# Patient Record
Sex: Female | Born: 1975 | Race: White | Hispanic: No | Marital: Single | Smoking: Former smoker
Health system: Southern US, Community
[De-identification: ages and names within clinical notes are randomized; demographics above are authoritative.]

## PROBLEM LIST (undated history)

## (undated) DIAGNOSIS — IMO0001 Reserved for inherently not codable concepts without codable children: Secondary | ICD-10-CM

## (undated) DIAGNOSIS — C50919 Malignant neoplasm of unspecified site of unspecified female breast: Secondary | ICD-10-CM

## (undated) DIAGNOSIS — R4589 Other symptoms and signs involving emotional state: Secondary | ICD-10-CM

## (undated) DIAGNOSIS — IMO0002 Reserved for concepts with insufficient information to code with codable children: Secondary | ICD-10-CM

## (undated) DIAGNOSIS — F909 Attention-deficit hyperactivity disorder, unspecified type: Secondary | ICD-10-CM

## (undated) DIAGNOSIS — F419 Anxiety disorder, unspecified: Secondary | ICD-10-CM

## (undated) DIAGNOSIS — F319 Bipolar disorder, unspecified: Secondary | ICD-10-CM

## (undated) DIAGNOSIS — F329 Major depressive disorder, single episode, unspecified: Secondary | ICD-10-CM

## (undated) HISTORY — DX: Other symptoms and signs involving emotional state: R45.89

## (undated) HISTORY — DX: Reserved for concepts with insufficient information to code with codable children: IMO0002

## (undated) HISTORY — DX: Malignant neoplasm of unspecified site of unspecified female breast: C50.919

## (undated) HISTORY — DX: Anxiety disorder, unspecified: F41.9

## (undated) HISTORY — PX: WISDOM TOOTH EXTRACTION: SHX21

## (undated) HISTORY — DX: Major depressive disorder, single episode, unspecified: F32.9

## (undated) HISTORY — DX: Bipolar disorder, unspecified: F31.9

## (undated) HISTORY — DX: Reserved for inherently not codable concepts without codable children: IMO0001

---

## 1998-06-29 ENCOUNTER — Emergency Department (HOSPITAL_COMMUNITY): Admission: EM | Admit: 1998-06-29 | Discharge: 1998-06-29 | Payer: Self-pay | Admitting: Internal Medicine

## 1998-10-08 ENCOUNTER — Emergency Department (HOSPITAL_COMMUNITY): Admission: EM | Admit: 1998-10-08 | Discharge: 1998-10-08 | Payer: Self-pay | Admitting: Emergency Medicine

## 1999-10-09 ENCOUNTER — Other Ambulatory Visit: Admission: RE | Admit: 1999-10-09 | Discharge: 1999-10-09 | Payer: Self-pay | Admitting: Obstetrics and Gynecology

## 2000-01-02 ENCOUNTER — Inpatient Hospital Stay (HOSPITAL_COMMUNITY): Admission: AD | Admit: 2000-01-02 | Discharge: 2000-01-02 | Payer: Self-pay | Admitting: Obstetrics and Gynecology

## 2000-01-03 ENCOUNTER — Inpatient Hospital Stay (HOSPITAL_COMMUNITY): Admission: AD | Admit: 2000-01-03 | Discharge: 2000-01-06 | Payer: Self-pay | Admitting: Obstetrics and Gynecology

## 2000-01-03 ENCOUNTER — Inpatient Hospital Stay (HOSPITAL_COMMUNITY): Admission: AD | Admit: 2000-01-03 | Discharge: 2000-01-03 | Payer: Self-pay | Admitting: Obstetrics & Gynecology

## 2015-08-10 LAB — HM MAMMOGRAPHY

## 2015-08-12 ENCOUNTER — Encounter: Payer: Self-pay | Admitting: *Deleted

## 2015-08-12 ENCOUNTER — Telehealth: Payer: Self-pay | Admitting: *Deleted

## 2015-08-12 DIAGNOSIS — C50211 Malignant neoplasm of upper-inner quadrant of right female breast: Secondary | ICD-10-CM | POA: Insufficient documentation

## 2015-08-12 NOTE — Telephone Encounter (Signed)
Confirmed [facility] for 08/17/15 at 12:30pm .  Instructions and contact information given.

## 2015-08-17 ENCOUNTER — Ambulatory Visit (HOSPITAL_BASED_OUTPATIENT_CLINIC_OR_DEPARTMENT_OTHER): Payer: BLUE CROSS/BLUE SHIELD | Admitting: Hematology and Oncology

## 2015-08-17 ENCOUNTER — Encounter: Payer: Self-pay | Admitting: Physical Therapy

## 2015-08-17 ENCOUNTER — Encounter: Payer: Self-pay | Admitting: Nurse Practitioner

## 2015-08-17 ENCOUNTER — Other Ambulatory Visit (HOSPITAL_BASED_OUTPATIENT_CLINIC_OR_DEPARTMENT_OTHER): Payer: BLUE CROSS/BLUE SHIELD

## 2015-08-17 ENCOUNTER — Ambulatory Visit: Payer: BLUE CROSS/BLUE SHIELD | Attending: General Surgery | Admitting: Physical Therapy

## 2015-08-17 ENCOUNTER — Encounter: Payer: Self-pay | Admitting: Hematology and Oncology

## 2015-08-17 ENCOUNTER — Ambulatory Visit
Admission: RE | Admit: 2015-08-17 | Discharge: 2015-08-17 | Disposition: A | Discharge status: Home / Self Care | Payer: BLUE CROSS/BLUE SHIELD | Source: Ambulatory Visit | Attending: Radiation Oncology | Admitting: Radiation Oncology

## 2015-08-17 VITALS — BP 146/84 | HR 108 | Temp 98.3°F | Resp 18 | Ht 61.0 in | Wt 184.2 lb

## 2015-08-17 DIAGNOSIS — R293 Abnormal posture: Secondary | ICD-10-CM | POA: Diagnosis present

## 2015-08-17 DIAGNOSIS — C50211 Malignant neoplasm of upper-inner quadrant of right female breast: Secondary | ICD-10-CM

## 2015-08-17 DIAGNOSIS — C50911 Malignant neoplasm of unspecified site of right female breast: Secondary | ICD-10-CM | POA: Insufficient documentation

## 2015-08-17 LAB — CBC WITH DIFFERENTIAL/PLATELET
BASO%: 0.2 % (ref 0.0–2.0)
Basophils Absolute: 0 10*3/uL (ref 0.0–0.1)
EOS%: 0.7 % (ref 0.0–7.0)
Eosinophils Absolute: 0.1 10*3/uL (ref 0.0–0.5)
HCT: 40.5 % (ref 34.8–46.6)
HGB: 14.1 g/dL (ref 11.6–15.9)
LYMPH%: 19.4 % (ref 14.0–49.7)
MCH: 28.1 pg (ref 25.1–34.0)
MCHC: 34.8 g/dL (ref 31.5–36.0)
MCV: 80.7 fL (ref 79.5–101.0)
MONO#: 0.5 10*3/uL (ref 0.1–0.9)
MONO%: 6.3 % (ref 0.0–14.0)
NEUT%: 73.4 % (ref 38.4–76.8)
NEUTROS ABS: 6.2 10*3/uL (ref 1.5–6.5)
Platelets: 228 10*3/uL (ref 145–400)
RBC: 5.02 10*6/uL (ref 3.70–5.45)
RDW: 12.9 % (ref 11.2–14.5)
WBC: 8.4 10*3/uL (ref 3.9–10.3)
lymph#: 1.6 10*3/uL (ref 0.9–3.3)

## 2015-08-17 LAB — COMPREHENSIVE METABOLIC PANEL (CC13)
ALT: 22 U/L (ref 0–55)
AST: 18 U/L (ref 5–34)
Albumin: 4 g/dL (ref 3.5–5.0)
Alkaline Phosphatase: 70 U/L (ref 40–150)
Anion Gap: 8 mEq/L (ref 3–11)
BILIRUBIN TOTAL: 0.53 mg/dL (ref 0.20–1.20)
BUN: 9.1 mg/dL (ref 7.0–26.0)
CO2: 25 meq/L (ref 22–29)
Calcium: 9.5 mg/dL (ref 8.4–10.4)
Chloride: 107 mEq/L (ref 98–109)
Creatinine: 0.8 mg/dL (ref 0.6–1.1)
GLUCOSE: 112 mg/dL (ref 70–140)
Potassium: 3.9 mEq/L (ref 3.5–5.1)
SODIUM: 140 meq/L (ref 136–145)
TOTAL PROTEIN: 7 g/dL (ref 6.4–8.3)

## 2015-08-17 NOTE — Patient Instructions (Signed)

## 2015-08-17 NOTE — Assessment & Plan Note (Signed)
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 Since his breast prognostic panel is not available at this time we cannot truly come up with a final treatment plan. After reviewing the pathology in detail, we proceeded to discuss the different treatment options between surgery, radiation, chemotherapy, antiestrogen therapies depending on what the ER/PR and HER-2/neu results show.  Indications for chemotherapy: 1. Triple negative disease 2. HER-2 positive disease 3. If ER/PR positive, high-risk by Oncotype DX 4. Or multiple lymph node involvement  Recommendations: 1. Breast conserving surgery followed by 2. Oncotype DX testing if ER/PR was positive 3. Adjuvant radiation therapy followed by 4. Adjuvant antiestrogen therapy only if ER/PR was positive  Oncotype counseling: I discussed Oncotype DX test. I explained to the patient that this is a 21 gene panel to evaluate patient tumors DNA to calculate recurrence score. This would help determine whether patient has high risk or intermediate risk or low risk breast cancer. She understands that if her tumor was found to be high risk, she would benefit from systemic chemotherapy. If low risk, no need of chemotherapy. If she was found to be intermediate risk, we would need to evaluate the score as well as other risk factors and determine if an abbreviated chemotherapy may be of benefit.  Return to clinic after surgery to discuss final pathology report and then determine if Oncotype DX testing will need to be sent.

## 2015-08-17 NOTE — Progress Notes (Signed)
Jessica Petty is a very pleasant 39 y.o. female from [place], [place] with newly diagnosed invasive ductal carcinoma of the right breast. The tumor's prognostic profile is still pending at this time.  She presents today with her husband to the [facility]) for treatment consideration and recommendations from the breast surgeon, radiation oncologist, and medical oncologist.     I briefly met with Jessica Petty and her husband during her [facility] visit today. We discussed the purpose of the Survivorship Clinic, which will include monitoring for recurrence, coordinating completion of age and gender-appropriate cancer screenings, promotion of overall wellness, as well as managing potential late/long-term side effects of anti-cancer treatments.    The treatment plan for Jessica Petty will likely include surgery, radiation therapy, and anti-estrogen therapy (pending the tumor's prognostic profile results).  She will meet with the Genetics Counselor due to her age. As of today, the intent of treatment for Jessica Petty is cure, therefore she will be eligible for the Survivorship Clinic upon her completion of treatment.  Her survivorship care plan (SCP) document will be drafted and updated throughout the course of her treatment trajectory. She will receive the SCP in an office visit with myself in the Survivorship Clinic once she has completed treatment.   Jessica Petty was encouraged to ask questions and all questions were answered to her satisfaction.  She was given my business card and encouraged to contact me with any concerns regarding survivorship.  I look forward to participating in her care.   Kenn File, [place] [phone]

## 2015-08-17 NOTE — Therapy (Signed)
[place], [place], [address] Phone: [phone]   Fax:  [phone]  Physical Therapy Evaluation  Patient Details  Name: Jessica Petty MRN: [id number] Date of Birth: 28-Dec-1975 Referring Provider:  Rolm Bookbinder, MD  Encounter Date: 08/17/2015      PT End of Session - 08/17/15 1518    Visit Number 1   Number of Visits 1   PT Start Time 3888   PT Stop Time 1446   PT Time Calculation (min) 21 min   Activity Tolerance Patient tolerated treatment well   Behavior During Therapy [facility] for tasks assessed/performed      Past Medical History  Diagnosis Date  . Breast cancer   . Anxiety     History reviewed. No pertinent past surgical history.  There were no vitals filed for this visit.  Visit Diagnosis:  Breast cancer, right breast - Plan: PT plan of care cert/re-cert  Abnormal posture - Plan: PT plan of care cert/re-cert      Subjective Assessment - 08/17/15 1446    Subjective Patient was seen today for a baseline assessment of her newly diagnosed right breast cancer.   Patient is accompained by: Family member   Pertinent History Patient was diagnosed 08/10/15 with right grade 2 invasive ductal carcinoma with DCIS breast cancer measuring 1.2 cm.  Her ER/PR/HER2 panel is pending.   Patient Stated Goals Lymphedema risk reduction and post op shoulder ROM HEP   Currently in Pain? No/denies            [facility] PT Assessment - 08/17/15 0001    Assessment   Medical Diagnosis Right breast cancer   Onset Date/Surgical Date 08/10/15   Hand Dominance Right   Prior Therapy none   Precautions   Precautions Other (comment)  Active breast cancer   Restrictions   Weight Bearing Restrictions No   Balance Screen   Has the patient fallen in the past 6 months No   Has the patient had a decrease in activity level because of a fear of falling?  No   Is the patient reluctant to leave their home because  of a fear of falling?  No   Home Environment   Living Environment Private residence   Living Arrangements Spouse/significant other  Husband and 76 y.o. 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   Leisure walks 2x/wk for 45 minutes   Cognition   Overall Cognitive Status Within Functional Limits for tasks assessed   Posture/Postural Control   Posture/Postural Control Postural limitations   Postural Limitations Rounded Shoulders;Forward head   ROM / Strength   AROM / PROM / Strength AROM;Strength   AROM   AROM Assessment Site Shoulder   Right/Left Shoulder Right;Left   Right Shoulder Extension 43 Degrees   Right Shoulder Flexion 148 Degrees   Right Shoulder ABduction 170 Degrees   Right Shoulder Internal Rotation 72 Degrees   Right Shoulder External Rotation 75 Degrees   Left Shoulder Extension 41 Degrees   Left Shoulder Flexion 144 Degrees   Left Shoulder ABduction 167 Degrees   Left Shoulder Internal Rotation 63 Degrees   Left Shoulder External Rotation 73 Degrees   Strength   Overall Strength Within functional limits for tasks performed           LYMPHEDEMA/ONCOLOGY QUESTIONNAIRE - 08/17/15 1514  Type   Cancer Type Right breast cancer   Lymphedema Assessments   Lymphedema Assessments Upper extremities   Right Upper Extremity Lymphedema   10 cm Proximal to Olecranon Process 31.7 cm   Olecranon Process 25 cm   10 cm Proximal to Ulnar Styloid Process 24.9 cm   Just Proximal to Ulnar Styloid Process 17 cm   Across Hand at [employer] 19.6 cm   At [place] of 2nd Digit 6.5 cm   Left Upper Extremity Lymphedema   10 cm Proximal to Olecranon Process 30.9 cm   Olecranon Process 24.3 cm   10 cm Proximal to Ulnar Styloid Process 24 cm   Just Proximal to Ulnar Styloid Process 15.9 cm   Across Hand at [employer] 19.1 cm   At  [place] of 2nd Digit 6.1 cm        Patient was instructed today in a home exercise program today for post op shoulder range of motion. These included active assist shoulder flexion in sitting, scapular retraction, wall walking with shoulder abduction, and hands behind head external rotation.  She was encouraged to do these twice a day, holding 3 seconds and repeating 5 times when permitted by her physician.          PT Education - 08/17/15 1517    Education provided Yes   Education Details Lymphedema risk reduction and post op shoulder ROM HEP   Person(s) Educated Patient;Spouse   Methods Explanation;Demonstration;Handout   Comprehension Returned demonstration;Verbalized understanding              Breast Clinic Goals - 08/17/15 1521    Patient will be able to verbalize understanding of pertinent lymphedema risk reduction practices relevant to her diagnosis specifically related to skin care.   Time 1   Period Days   Status Achieved   Patient will be able to return demonstrate and/or verbalize understanding of the post-op home exercise program related to regaining shoulder range of motion.   Time 1   Period Days   Status Achieved   Patient will be able to verbalize understanding of the importance of attending the postoperative After Breast Cancer Class for further lymphedema risk reduction education and therapeutic exercise.   Time 1   Period Days   Status Achieved              Plan - 08/17/15 1518    Clinical Impression Statement Patient was diagnosed 08/10/15 with right grade 2 invasive ductal carcinoma with DCIS breast cancer measuring 1.2 cm.  Her ER/PR/HER2 panel is pending.  She is planing to have a right lumpectomy with a sentinel node biopsy followed by radiation.  Her prognostic panel is pending so she may undergo anti-estrogen therapy and/or Herceptin depending on findings.  She may benefit from post op PT to regain shoulder ROM and strength and reduce lymphedema  risk.   Pt will benefit from skilled therapeutic intervention in order to improve on the following deficits Decreased strength;Decreased knowledge of precautions;Pain;Impaired UE functional use;Increased fascial restricitons;Decreased range of motion   Rehab Potential Excellent   Clinical Impairments Affecting Rehab Potential None   PT Frequency One time visit   PT Treatment/Interventions Therapeutic exercise;Patient/family education   Consulted and Agree with Plan of Care Patient;Family member/caregiver   Family Member Consulted Husband       Patient will follow up at outpatient cancer rehab if needed following surgery.  If the patient requires physical therapy at that time, a specific plan will be dictated  and sent to the referring physician for approval. The patient was educated today on appropriate basic range of motion exercises to begin post operatively and the importance of attending the After Breast Cancer class following surgery.  Patient was educated today on lymphedema risk reduction practices as it pertains to recommendations that will benefit the patient immediately following surgery.  She verbalized good understanding.  No additional physical therapy is indicated at this time.      Problem List Patient Active Problem List   Diagnosis Date Noted  . Breast cancer of upper-inner quadrant of right female breast 08/12/2015   Annia Friendly, PT 08/17/2015 3:23 PM  [place], [place], [address] Phone: [phone]   Fax:  [phone]

## 2015-08-17 NOTE — Progress Notes (Signed)
[facility] NOTE  Patient Care Team: Brien Few, MD as Consulting Physician (Obstetrics and Gynecology) Rolm Bookbinder, MD as Consulting Physician (General Surgery) Nicholas Lose, MD as Consulting Physician (Hematology and Oncology) Arloa Koh, MD as Consulting Physician (Radiation Oncology) Rockwell Germany, RN as Registered Nurse Mauro Kaufmann, RN as Registered Nurse Sylvan Cheese, NP as Nurse Practitioner (Nurse Practitioner)  CHIEF COMPLAINTS/PURPOSE OF CONSULTATION:  Newly diagnosed breast cancer  HISTORY OF PRESENTING ILLNESS:  Jessica Petty 39 y.o. female is here because of recent diagnosis of right breast cancer. Patient felt a mass in the right breast and subsequently went to undergo mammogram and ultrasound. Ultrasound revealed a 1.2 cm irregular spiculated mass. Ultrasound-guided biopsy revealed invasive ductal carcinoma with DCIS that was grade 2. ER/PR and HER-2/neu are pending. She was presented at the multidisciplinary tumor board and she is here today to discuss a treatment plan. Patient denies any pain or discomfort in the breast.  I reviewed her records extensively and collaborated the history with the patient.  SUMMARY OF ONCOLOGIC HISTORY:   Breast cancer of upper-inner quadrant of right female breast   08/10/2015 Mammogram Right breast mass 1.2 cm irregular spiculated   08/11/2015 Initial Diagnosis 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, grade 2-3    In terms of breast cancer risk profile:  She menarched at early age of 24 and her last period was 2011  She had one pregnancy, her first child was born at age 25  She has received birth control pills for approximately uncertain time.  She was never exposed to fertility medications or hormone replacement therapy.  She has  family history of Breast/GYN/GI cancer Maternal uncle 27 lung cancer maternal aunt liver cancer paternal aunt age 28 ovarian  cancer  MEDICAL HISTORY:  Past Medical History  Diagnosis Date  . Breast cancer   . Anxiety     SURGICAL HISTORY: History reviewed. No pertinent past surgical history.  SOCIAL HISTORY: Social History   Social History  . Marital Status: Single    Spouse Name: N/A  . Number of Children: N/A  . Years of Education: N/A   Occupational History  . Not on file.   Social History Main Topics  . Smoking status: Former Research scientist (life sciences)  . Smokeless tobacco: Not on file  . Alcohol Use: Yes  . Drug Use: No  . Sexual Activity: Not on file   Other Topics Concern  . Not on file   Social History Narrative    FAMILY HISTORY: Family History  Problem Relation Age of Onset  . Liver cancer Maternal Aunt   . Lung cancer Maternal Uncle   . Ovarian cancer Paternal Aunt     ALLERGIES:  has no allergies on file.  MEDICATIONS:  Current Outpatient Prescriptions  Medication Sig Dispense Refill  . ADDERALL XR 20 MG 24 hr capsule Take 20 mg by mouth.  0  . ALPRAZolam (XANAX) 1 MG tablet Take 1 mg by mouth 3 (three) times daily as needed.  5  . ARIPiprazole (ABILIFY) 10 MG tablet Take 10 mg by mouth at bedtime.  11   No current facility-administered medications for this visit.    REVIEW OF SYSTEMS:   Constitutional: Denies fevers, chills or abnormal night sweats Eyes: Denies blurriness of vision, double vision or watery eyes Ears, nose, mouth, throat, and face: Denies mucositis or sore throat Respiratory: Denies cough, dyspnea or wheezes Cardiovascular: Denies palpitation, chest discomfort or lower extremity swelling Gastrointestinal:  Denies nausea, heartburn or change in bowel habits Skin: Denies abnormal skin rashes Lymphatics: Denies new lymphadenopathy or easy bruising Neurological:Denies numbness, tingling or new weaknesses Behavioral/Psych: Mood is stable, no new changes  Breast: Patient felt a lump in the left breast All other systems were reviewed with the patient and are  negative.  PHYSICAL EXAMINATION: ECOG PERFORMANCE STATUS: 1 - Symptomatic but completely ambulatory  Filed Vitals:   08/17/15 1259  BP: 146/84  Pulse: 108  Temp: 98.3 F (36.8 C)  Resp: 18   Filed Weights   08/17/15 1259  Weight: 184 lb 3.2 oz (83.553 kg)    GENERAL:alert, no distress and comfortable SKIN: skin color, texture, turgor are normal, no rashes or significant lesions EYES: normal, conjunctiva are pink and non-injected, sclera clear OROPHARYNX:no exudate, no erythema and lips, buccal mucosa, and tongue normal  NECK: supple, thyroid normal size, non-tender, without nodularity LYMPH:  no palpable lymphadenopathy in the cervical, axillary or inguinal LUNGS: clear to auscultation and percussion with normal breathing effort HEART: regular rate & rhythm and no murmurs and no lower extremity edema ABDOMEN:abdomen soft, non-tender and normal bowel sounds Musculoskeletal:no cyanosis of digits and no clubbing  PSYCH: alert & oriented x 3 with fluent speech NEURO: no focal motor/sensory deficits BREAST: At the area of biopsy there is a palpable left breast lump which could be hematoma on the palpable lump. No palpable axillary or supraclavicular lymphadenopathy (exam performed in the presence of a chaperone)   LABORATORY DATA:  I have reviewed the data as listed Lab Results  Component Value Date   WBC 8.4 08/17/2015   HGB 14.1 08/17/2015   HCT 40.5 08/17/2015   MCV 80.7 08/17/2015   PLT 228 08/17/2015   Lab Results  Component Value Date   NA 140 08/17/2015   K 3.9 08/17/2015   CO2 25 08/17/2015    RADIOGRAPHIC STUDIES: I have personally reviewed the radiological reports and agreed with the findings in the report.  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 Since his breast prognostic panel is not available at this time we cannot truly come up with a final treatment plan. After reviewing the pathology in detail, we proceeded to discuss the different treatment options between surgery, radiation, chemotherapy, antiestrogen therapies depending on what the ER/PR and HER-2/neu results show.  Indications for chemotherapy: 1. Triple negative disease 2. HER-2 positive disease 3. If ER/PR positive, high-risk by Oncotype DX 4. Or multiple lymph node involvement  Recommendations: 1. Breast conserving surgery followed by 2. Oncotype DX testing if ER/PR was positive 3. Adjuvant radiation therapy followed by 4. Adjuvant antiestrogen therapy only if ER/PR was positive  Oncotype counseling: I discussed Oncotype DX test. I explained to the patient that this is a 21 gene panel to evaluate patient tumors DNA to calculate recurrence score. This would help determine whether patient has high risk or intermediate risk or low risk breast cancer. She understands that if her tumor was found to be high risk, she would benefit from systemic chemotherapy. If low risk, no need of chemotherapy. If she was found to be intermediate risk, we would  need to evaluate the score as well as other risk factors and determine if an abbreviated chemotherapy may be of benefit.  Return to clinic after surgery to discuss final pathology report and then determine if Oncotype DX testing will need to be sent.     All questions were answered. The patient knows to call the clinic with any problems, questions or concerns.    Rulon Eisenmenger, MD 5:26 PM

## 2015-08-17 NOTE — Progress Notes (Signed)
[facility] NEW PATIENT EVALUATION  Name: Jessica Petty MRN: [id number]  Date:   08/17/2015           DOB: 1976-06-04  Status: outpatient   CC: No primary care provider on file.  Rolm Bookbinder, MD    REFERRING PHYSICIAN: Rolm Bookbinder, MD   DIAGNOSIS: Clinical stage I A (T1 N0 M0) invasive ductal/DCIS of the right breast   HISTORY OF PRESENT ILLNESS:  Jessica Petty is a 39 y.o. female who is seen today at the breast multidisciplinary clinic through the courtesy of Dr. Donne Hazel for evaluation of her T1 N0 invasive ductal/DCIS of the right breast.  She presented with a palpable mass along the medial aspect of the right breast, upper inner quadrant.  She underwent mammography at [facility] on 08/10/2015.  She was felt to have an irregular mass within the right breast at 1:00.  There was architectural distortion.  She had a 1.2 cm mass seen on ultrasound for which a biopsy was recommended.  Biopsy on 08/11/2015 was diagnostic for invasive ductal carcinoma/DCIS.  Her prognostic profile is pending at the time this dictation and should be available by later tomorrow afternoon.  She seen today with Dr. Lindi Adie in Dr. Donne Hazel.  She is without complaints.  PREVIOUS RADIATION THERAPY: No   PAST MEDICAL HISTORY:  has a past medical history of Breast cancer and Anxiety.     PAST SURGICAL HISTORY: No past surgical history on file.   FAMILY HISTORY: family history includes Liver cancer in her maternal aunt; Lung cancer in her maternal uncle; Ovarian cancer in her paternal aunt. Her father is alive at 57 with cardiac disease, in her mother is alive and well at 70.  A paternal aunt was diagnosed with ovarian cancer at 4.  No family history of breast cancer.   SOCIAL HISTORY:  reports that she has quit smoking. She does not have any smokeless tobacco history on file. She reports that she drinks alcohol. She reports that she does not use illicit drugs.  Married,  53 year old daughter.  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   ALLERGIES: Review of patient's allergies indicates not on file.   MEDICATIONS:  Current Outpatient Prescriptions  Medication Sig Dispense Refill  . ADDERALL XR 20 MG 24 hr capsule Take 20 mg by mouth.  0  . ALPRAZolam (XANAX) 1 MG tablet Take 1 mg by mouth 3 (three) times daily as needed.  5  . ARIPiprazole (ABILIFY) 10 MG tablet Take 10 mg by mouth at bedtime.  11   No current facility-administered medications for this encounter.     REVIEW OF SYSTEMS:  Pertinent items are noted in HPI.    PHYSICAL EXAM: Alert and oriented 39 year old white female appearing her stated age. Wt Readings from Last 3 Encounters:  08/17/15 184 lb 3.2 oz (83.553 kg)   Temp Readings from Last 3 Encounters:  08/17/15 98.3 F (36.8 C) Oral   BP Readings from Last 3 Encounters:  08/17/15 146/84   Pulse Readings from Last 3 Encounters:  08/17/15 108   Head and neck examination: Grossly unremarkable.  Nodes: Without palpable cervical, supraclavicular, or axillary lymphadenopathy.  Breasts: There is a punctate biopsy wound with ecchymosis along the upper inner quadrant of the right breast at approximately 1 to 2:00 where there is a small ill-defined mass.  Left breast without masses or lesions.  Extremities: Without edema.    LABORATORY DATA:  Lab Results  Component Value Date  WBC 8.4 08/17/2015   HGB 14.1 08/17/2015   HCT 40.5 08/17/2015   MCV 80.7 08/17/2015   PLT 228 08/17/2015   Lab Results  Component Value Date   NA 140 08/17/2015   K 3.9 08/17/2015   CO2 25 08/17/2015   Lab Results  Component Value Date   ALT 22 08/17/2015   AST 18 08/17/2015   ALKPHOS 70 08/17/2015   BILITOT 0.53 08/17/2015      IMPRESSION: Clinical stage IA (T1 N0 M0) invasive ductal/DCIS of the right breast.  We talked about local management options which include mastectomy with or without reconstruction and breast conservation surgery  followed by radiation therapy.  We discussed hypofractionated treatment versus standard fractionation.  She is obviously a candidate for genetic testing and this may influence her management choice.  We should have her prognostic profile by tomorrow.  Following genetic testing we may be better able to advise her regarding her choice of local regional therapy.  PLAN: As discussed above.  I spent 30  minutes face to face with the patient and more than 50% of that time was spent in counseling and/or coordination of care.

## 2015-08-18 ENCOUNTER — Ambulatory Visit (HOSPITAL_BASED_OUTPATIENT_CLINIC_OR_DEPARTMENT_OTHER): Payer: BLUE CROSS/BLUE SHIELD | Admitting: Genetic Counselor

## 2015-08-18 ENCOUNTER — Encounter: Payer: Self-pay | Admitting: Genetic Counselor

## 2015-08-18 ENCOUNTER — Other Ambulatory Visit: Payer: BLUE CROSS/BLUE SHIELD

## 2015-08-18 DIAGNOSIS — C50211 Malignant neoplasm of upper-inner quadrant of right female breast: Secondary | ICD-10-CM

## 2015-08-18 DIAGNOSIS — Z800 Family history of malignant neoplasm of digestive organs: Secondary | ICD-10-CM | POA: Diagnosis not present

## 2015-08-18 DIAGNOSIS — Z315 Encounter for genetic counseling: Secondary | ICD-10-CM

## 2015-08-18 DIAGNOSIS — Z801 Family history of malignant neoplasm of trachea, bronchus and lung: Secondary | ICD-10-CM | POA: Diagnosis not present

## 2015-08-18 DIAGNOSIS — Z8041 Family history of malignant neoplasm of ovary: Secondary | ICD-10-CM

## 2015-08-18 NOTE — Progress Notes (Signed)
REFERRING PROVIDER: Nicholas Lose, Jessica Petty  OTHER PROVIDER(S): Arloa Koh, Jessica Petty  PRIMARY PROVIDER:  No primary care provider on file.  PRIMARY REASON FOR VISIT:  1. Breast cancer of upper-inner quadrant of right female breast   2. Family history of ovarian cancer   3. Family history of lung cancer   4. Family history of liver cancer      HISTORY OF PRESENT ILLNESS:   Jessica Petty, a 39 y.o. female, was seen for a [facility] cancer genetics consultation at the request of Dr. Lindi Adie due to a personal history of breast cancer at 37 and family history of ovarian and other cancers.  Jessica Petty presents to clinic today with her daughter to discuss the possibility of a hereditary predisposition to cancer, genetic testing, and to further clarify her future cancer risks, as well as potential cancer risks for family members.   In 2016, at the age of 34, Jessica Petty was diagnosed with invasive ductal carcinoma with DCIS of the right breast. Hormone receptor status is still pending.  Surgery and treatment decisions are pending results of genetic testing.    CANCER HISTORY:    Breast cancer of upper-inner quadrant of right female breast   08/10/2015 Mammogram Right breast mass 1.2 cm irregular spiculated   08/11/2015 Initial Diagnosis 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, grade 2-3     HORMONAL RISK FACTORS:  Menarche was at age 89.  First live birth at age 36.  OCP use for approximately IUD for approx 5 years (currently); hormonal birth control for about 10 years total  Ovaries intact: yes.  Hysterectomy: no.  Menopausal status: premenopausal.  HRT use: 0 years. Colonoscopy: no; not examined. Mammogram within the last year: yes, this was her first mammogram. Number of breast biopsies: 1. Up to date with pelvic exams:  yes. Any excessive radiation exposure in the past:  no  Past Medical History  Diagnosis Date  . Breast cancer   . Anxiety     History reviewed.  No pertinent past surgical history.  Social History   Social History  . Marital Status: Single    Spouse Name: N/A  . Number of Children: N/A  . Years of Education: N/A   Social History Main Topics  . Smoking status: Former Smoker    Types: Cigarettes  . Smokeless tobacco: Never Used     Comment: less than half a pack - only smoked socially, maybe once a week  . Alcohol Use: Yes     Comment: 5 beers per day (reported by daughter)  . Drug Use: No  . Sexual Activity: Not Asked   Other Topics Concern  . None   Social History Narrative     FAMILY HISTORY:  We obtained a detailed, 4-generation family history.  Significant diagnoses are listed below: Family History  Problem Relation Age of Onset  . Liver cancer Maternal Aunt 62    terminal; not a drinker  . Lung cancer Maternal Uncle 50    former smoker  . Ovarian cancer Paternal Aunt 60    metastasis to cervix and other areas  . Other Sister     "couple of benign cervical biopsies"  . Stroke Maternal Grandmother   . Heart Problems Maternal Grandmother   . Heart Problems Maternal Grandfather   . Heart attack Paternal Grandmother     Jessica Petty has one daughter, Jessica (Addie), who is currently 10.  Jessica Petty has one full sister and one full brother, ages  34 and 41.  Neither sibling has had cancer, but Jessica Petty's sister has a history of a "couple benign cervical biopsies".  Jessica Petty mother and father are alive and well at the ages of 50 and 26, respectively--neither have had cancer.    There is a paternal family history of ovarian cancer in Jessica Petty. Sorter's paternal aunt, diagnosed at 26.  This aunt passed away at 81, following metastasis of this cancer to her cervix and other areas.  She had no children.  This was Jessica Petty's father's only sibling.  His mother died at 67 of a heart attack; his father died at 61 and reportedly had poor overall health.    Jessica Petty mother had two full sisters and one full  brother.  One sister was recently diagnosed with terminal liver cancer at 32.  She is not a drinker.  Another sister is 17 and has not had cancer.  Jessica Petty maternal uncle, a former smoker, was diagnosed with lung cancer at 61.  Jessica Petty maternal grandmother died at 42 of a stroke following open heart surgery.  Her maternal grandfather died with heart problems at the age of 34.  Jessica Petty did not report any additional cancer diagnoses in the family.    Patient's maternal ancestors are of Caucasian/English descent, and paternal ancestors are of Caucasian/European/Scottish descent. There is no reported Ashkenazi Jewish ancestry. There is no known consanguinity.  GENETIC COUNSELING ASSESSMENT: Jessica Petty is a 39 y.o. female with a personal and family history of cancer which is somewhat suggestive of a hereditary breast/ovarian cancer syndrome and predisposition to cancer. We, therefore, discussed and recommended the following at today's visit.   DISCUSSION: We reviewed the characteristics, features and inheritance patterns of hereditary cancer syndromes, particularly those caused by mutations in the BRCA1/2 and Lynch syndrome genes. We also discussed genetic testing, including the appropriate family members to test, the process of testing, insurance coverage and turn-around-time for results. We discussed the implications of a negative, positive and/or variant of uncertain significant result. We recommended Jessica Petty pursue genetic testing for the 20-gene Breast/Ovarian Cancer Panel through [facility] Jessica Pigeon, Jessica Petty).  The Breast/Ovarian gene panel offered by [facility] includes sequencing and deletion/duplication analysis for the following 19 genes:  ATM, BARD1, BRCA1, BRCA2, BRIP1, CDH1, CHEK2, FANCC, MLH1, MSH2, MSH6, NBN, PALB2, PMS2, PTEN, RAD51C, RAD51D, TP53, and XRCC2.  This panel also includes deletion/duplication analysis (without sequencing) for one gene, EPCAM.  Based  on Jessica Petty. Allocca's personal and family history of cancer, she meets medical criteria for genetic testing. Despite that she meets criteria, she may still have an out of pocket cost. We discussed that if her out of pocket cost for testing is over $100, the laboratory will call and confirm whether she wants to proceed with testing.  If the out of pocket cost of testing is less than $100 she will be billed by the genetic testing laboratory.   PLAN: After considering the risks, benefits, and limitations, Jessica Petty. Kim  provided informed consent to pursue genetic testing and the blood sample was sent to [facility] for analysis of the 20-gene Breast/Ovarian Cancer test. Results are generally available within approximately 2-3 weeks' time, however, since Jessica Petty. Rueger will be using these results to make surgical and treatment-related decisions in the very near future, we will let [facility] know that we need these results STAT.  Thus, we should get these results back closer to the 2-week time frame.  At that point in time, they  will be disclosed by telephone to Jessica Petty. Barner, as will any additional recommendations warranted by these results. Jessica Petty. Argote will receive a summary of her genetic counseling visit and a copy of her results once available. This information will also be available in Epic. We encouraged Jessica Petty. Mace to remain in contact with cancer genetics annually so that we can continuously update the family history and inform her of any changes in cancer genetics and testing that may be of benefit for her family. Jessica Petty. Rew questions were answered to her satisfaction today. Our contact information was provided should additional questions or concerns arise.  Thank you for the referral and allowing [place] to share in the care of your patient.   Jeanine Luz, Jessica Petty Genetic Counselor [email or web address] Phone: [phone]  The patient was seen for a total of 45 minutes in face-to-face genetic  counseling.  This patient was discussed with Drs. Magrinat, Lindi Adie and/or Burr Medico who agrees with the above.    _______________________________________________________________________ For Office Staff:  Number of people involved in session: 2 Was an Intern/ student involved with case: no

## 2015-08-19 ENCOUNTER — Encounter: Payer: Self-pay | Admitting: General Practice

## 2015-08-19 NOTE — Progress Notes (Signed)
[facility] Psychosocial Distress Screening Spiritual Care  Spiritual Care was referred by distress screening protocol.  The patient scored a 8 on the Psychosocial Distress Thermometer which indicates severe distress. Followed up by phone to assess for distress and other psychosocial needs.   ONCBCN DISTRESS SCREENING 08/19/2015  Screening Type Initial Screening  Distress experienced in past week (1-10) 8  Emotional problem type Adjusting to illness  Information Concerns Type Lack of info about diagnosis;Lack of info about treatment  Referral to support programs Yes  Other [facility] team   Herman was in good spirits this afternoon (distress level reduced to 4), reporting relief after [facility] appointment.  She reports strong support from family and friends and confidence in her "great team" at [facility]:  "I'm feeling positive, doing better than I expected.Marland KitchenMarland KitchenI'm still thinking we're gonna knock this [cancer] outta the park!!"  She was very Patent attorney to learn of [employer] team and programming, looks forward to utilizing free massages, and plans "definitely" to reach out to chaplain again.  Follow up needed: No.  Pt aware of ongoing Support Team availability, but please also page as needs arise.  Thank you.  [place], [place] Pager [phone] Voicemail  [phone]

## 2015-08-22 ENCOUNTER — Telehealth: Payer: Self-pay | Admitting: *Deleted

## 2015-08-22 ENCOUNTER — Encounter: Payer: Self-pay | Admitting: *Deleted

## 2015-08-22 NOTE — Telephone Encounter (Signed)
Spoke with patient from [facility] 08/17/15.  She is doing well.  Informed her of her prognostic panel.  We await a surgical date.  Encouraged her to call with any needs or concerns.

## 2015-08-23 ENCOUNTER — Other Ambulatory Visit: Payer: Self-pay | Admitting: General Surgery

## 2015-08-23 DIAGNOSIS — C50211 Malignant neoplasm of upper-inner quadrant of right female breast: Secondary | ICD-10-CM

## 2015-08-29 ENCOUNTER — Telehealth: Payer: Self-pay | Admitting: Genetic Counselor

## 2015-08-29 ENCOUNTER — Ambulatory Visit: Payer: Self-pay | Admitting: Genetic Counselor

## 2015-08-29 DIAGNOSIS — Z1379 Encounter for other screening for genetic and chromosomal anomalies: Secondary | ICD-10-CM

## 2015-08-29 NOTE — Progress Notes (Signed)
GENETIC TEST RESULTS  HPI: Jessica Petty was previously seen in the [place] clinic due to a personal history of breast cancer at 43, family history of ovarian and other cancer, and concerns regarding a hereditary predisposition to cancer. Please refer to our prior cancer genetics clinic note from August 18, 2015 for more information regarding Jessica Petty's medical, social and family histories, and our assessment and recommendations, at the time. Jessica Petty recent genetic test results were disclosed to her, as were recommendations warranted by these results. These results and recommendations are discussed in more detail below.  GENETIC TEST RESULTS: At the time of Jessica Petty visit on 08/18/15, we recommended she pursue genetic testing of the 20-gene Breast/Ovarian Cancer Panel through [facility] Hope Pigeon, MD).  The Breast/Ovarian Cancer Panel offered by [facility] includes sequencing and deletion/duplication analysis for the following 19 genes:  ATM, BARD1, BRCA1, BRCA2, BRIP1, CDH1, CHEK2, FANCC, MLH1, MSH2, MSH6, NBN, PALB2, PMS2, PTEN, RAD51C, RAD51D, TP53, and XRCC2.  This panel also includes deletion/duplication analysis (without sequencing) for one gene, EPCAM.  Those results are now back, the report date for which is August 29, 2015.  Genetic testing was normal, and did not reveal a deleterious mutation in these genes.  Additionally, no variants of uncertain significance (VUSs) were found.  The test report will be scanned into EPIC and will be located under the Results Review tab in the Molecular Pathology section.   We discussed with Jessica Petty that since the current genetic testing is not perfect, it is possible there may be a gene mutation in one of these genes that current testing cannot detect, but that chance is small. We also discussed, that it is possible that another gene that has not yet been discovered, or that we have not yet tested, is  responsible for the cancer diagnoses in the family, and it is, therefore, important to remain in touch with cancer genetics in the future so that we can continue to offer Jessica Petty the most up to date genetic testing.   CANCER SCREENING RECOMMENDATIONS: While we still do not have an explanation for the personal and family history of cancer, this result is reassuring and indicates that Jessica Petty likely does not have an increased risk for a future cancer due to a mutation in one of these genes. This normal test also suggests that Jessica Petty cancer was most likely not due to an inherited predisposition associated with one of these genes.  Neither of Jessica Petty parents have had cancer and many family members have either not had cancer or have been diagnosed with cancer at much later ages in life.  Thus, the family history if reassuring as well.  Most cancers happen by chance and this negative test suggests that her cancer falls into this category.  We, therefore, recommended she continue to follow the cancer management and screening guidelines provided by her oncology and primary healthcare providers.   RECOMMENDATIONS FOR FAMILY MEMBERS: Women in this family might be at some increased risk of developing cancer, over the general population risk, simply due to the family history of cancer. We recommended women in this family have a yearly mammogram beginning at age 23, or 65 years younger than the earliest onset of cancer, an an annual clinical breast exam, and perform monthly breast self-exams. Thus, women in the family would likely be eligible to begin annual mammogram screening at the age of 74 (ten years younger than Jessica Petty age at diagnosis).  Ms.  Petty sister should be having her annual mammograms now.  [name] daughter will need to start mammograms earlier, but as guidelines could change by the time she nears that age, she should make her future primary doctor aware of the  family history so that she may receive the most appropriate and up-to-date screening in the future.  Women in this family should also have a gynecological exam as recommended by their primary provider. All family members should have a colonoscopy by age 9.  Because we cannot rule out the possibility that there may be something genetic on the paternal side of the family, we would recommend that [name]'s sister also have genetic counseling and testing.  [name] paternal aunt was diagnosed with ovarian cancer at 68 and has passed away; she was the only sibling of [name]'s father.  We discussed that her father might have been a carrier potentially for something that explains the history of ovarian cancer but that [name] herself did not inherit.  While it is less likely that her sister will test positive for anything, genetic counseling and testing would still be an option for her, if she is interested.  Since she lives in [place], [place]. Tello's sister can use the [employer] website "[email or web address]" to find a Dietitian in her area.    FOLLOW-UP: Lastly, we discussed with [name] that cancer genetics is a rapidly advancing field and it is possible that new genetic tests will be appropriate for her and/or her family members in the future. We encouraged her to remain in contact with cancer genetics on an annual basis so we can update her personal and family histories and let her know of advances in cancer genetics that may benefit this family.   Our contact number was provided. [name] questions were answered to her satisfaction, and she knows she is welcome to call us at anytime with additional questions or concerns.   Jeanine Luz, MS Genetic Counselor [email or web address] Phone: [phone]

## 2015-08-29 NOTE — Telephone Encounter (Signed)
Discussed with Ms. Whitworth that her genetic test results were negative for pathogenic mutations within any of 20 genes that would cause her to be at an increased risk for breast, ovarian, or other related cancers.  Additionally, no uncertain genetic changes were found.  We still do not have an explanation for why Ms. Garno was diagnosed with cancer at a young age, but most cancer is not genetic.  It might also be reassuring that Ms. Swarey's parents have not themselves had cancer.  However, we did discuss that, because men do not have ovaries and are less likely to get breast cancer, we cannot entirely rule out that there may be something genetic on dad's side of the family that Ms. Cumbie just did not inherit.  Thus, Ms. Tuff sister would probably be eligible for genetic counseling and testing, if interested.  Since Ms. Hovsepian's sister has not had cancer and she has and has tested negative, it makes it less likely that this sister will test positive, but this would still be an option for her.  Since this sister lives in [place], she can use the [employer] website to look-up a Dietitian in her area. We discussed that future cancer screening will be based upon the family and personal history of cancer.  Women in the family are at a somewhat increased risk for breast cancer based on the history for Ms. Hall.  Thus, Ms. Vipond female relatives--including her sister and daughter--should begin annual mammogram screening earlier, likely at the age of 60 (65 years younger than Ms. Creveling's diagnosis at 4).  These results will be scanned into Ms. Yuan's record and her doctors will be made aware.  She is welcome to call me with any further questions.

## 2015-08-30 ENCOUNTER — Telehealth: Payer: Self-pay | Admitting: Hematology and Oncology

## 2015-08-30 ENCOUNTER — Encounter: Payer: Self-pay | Admitting: *Deleted

## 2015-08-30 NOTE — Telephone Encounter (Signed)
Called patient and she is aware of her follow up °

## 2015-09-07 ENCOUNTER — Encounter (HOSPITAL_BASED_OUTPATIENT_CLINIC_OR_DEPARTMENT_OTHER): Payer: Self-pay | Admitting: *Deleted

## 2015-09-14 ENCOUNTER — Ambulatory Visit (HOSPITAL_BASED_OUTPATIENT_CLINIC_OR_DEPARTMENT_OTHER): Payer: BLUE CROSS/BLUE SHIELD | Admitting: Certified Registered"

## 2015-09-14 ENCOUNTER — Encounter (HOSPITAL_BASED_OUTPATIENT_CLINIC_OR_DEPARTMENT_OTHER): Payer: Self-pay | Admitting: Certified Registered"

## 2015-09-14 ENCOUNTER — Ambulatory Visit (HOSPITAL_BASED_OUTPATIENT_CLINIC_OR_DEPARTMENT_OTHER)
Admission: RE | Admit: 2015-09-14 | Discharge: 2015-09-14 | Disposition: A | Discharge status: Home / Self Care | Payer: BLUE CROSS/BLUE SHIELD | Source: Ambulatory Visit | Attending: General Surgery | Admitting: General Surgery

## 2015-09-14 ENCOUNTER — Ambulatory Visit (HOSPITAL_COMMUNITY)
Admission: RE | Admit: 2015-09-14 | Discharge: 2015-09-14 | Disposition: A | Discharge status: Home / Self Care | Payer: BLUE CROSS/BLUE SHIELD | Source: Ambulatory Visit | Attending: General Surgery | Admitting: General Surgery

## 2015-09-14 ENCOUNTER — Encounter (HOSPITAL_BASED_OUTPATIENT_CLINIC_OR_DEPARTMENT_OTHER)
Admission: RE | Disposition: A | Discharge status: Home / Self Care | Payer: Self-pay | Source: Ambulatory Visit | Attending: General Surgery

## 2015-09-14 DIAGNOSIS — D0511 Intraductal carcinoma in situ of right breast: Secondary | ICD-10-CM | POA: Insufficient documentation

## 2015-09-14 DIAGNOSIS — Z87891 Personal history of nicotine dependence: Secondary | ICD-10-CM | POA: Diagnosis not present

## 2015-09-14 DIAGNOSIS — C50211 Malignant neoplasm of upper-inner quadrant of right female breast: Secondary | ICD-10-CM

## 2015-09-14 HISTORY — PX: RADIOACTIVE SEED GUIDED PARTIAL MASTECTOMY WITH AXILLARY SENTINEL LYMPH NODE BIOPSY: SHX6520

## 2015-09-14 SURGERY — RADIOACTIVE SEED GUIDED PARTIAL MASTECTOMY WITH AXILLARY SENTINEL LYMPH NODE BIOPSY
Anesthesia: Regional | Site: Breast | Laterality: Right

## 2015-09-14 MED ORDER — BUPIVACAINE HCL (PF) 0.25 % IJ SOLN
INTRAMUSCULAR | Status: AC
  Filled 2015-09-14: qty 30

## 2015-09-14 MED ORDER — SCOPOLAMINE 1 MG/3DAYS TD PT72
1.0000 | MEDICATED_PATCH | Freq: Once | TRANSDERMAL | Status: DC | PRN
Start: 2015-09-14 — End: 2015-09-14
  Administered 2015-09-14: 1.5 mg via TRANSDERMAL

## 2015-09-14 MED ORDER — BUPIVACAINE HCL (PF) 0.25 % IJ SOLN
INTRAMUSCULAR | Status: DC | PRN
  Administered 2015-09-14: 9 mL

## 2015-09-14 MED ORDER — GLYCOPYRROLATE 0.2 MG/ML IJ SOLN: 0.2000 mg | Freq: Once | INTRAMUSCULAR | Status: DC | PRN

## 2015-09-14 MED ORDER — OXYCODONE HCL 5 MG PO TABS
ORAL_TABLET | ORAL | Status: AC
  Filled 2015-09-14: qty 1

## 2015-09-14 MED ORDER — FENTANYL CITRATE (PF) 100 MCG/2ML IJ SOLN
INTRAMUSCULAR | Status: AC
  Filled 2015-09-14: qty 4

## 2015-09-14 MED ORDER — FENTANYL CITRATE (PF) 100 MCG/2ML IJ SOLN
INTRAMUSCULAR | Status: AC
  Filled 2015-09-14: qty 2

## 2015-09-14 MED ORDER — ONDANSETRON HCL 4 MG/2ML IJ SOLN
INTRAMUSCULAR | Status: DC | PRN
  Administered 2015-09-14: 4 mg via INTRAVENOUS

## 2015-09-14 MED ORDER — PROPOFOL 10 MG/ML IV BOLUS
INTRAVENOUS | Status: DC | PRN
  Administered 2015-09-14: 20 mg via INTRAVENOUS
  Administered 2015-09-14: 200 mg via INTRAVENOUS

## 2015-09-14 MED ORDER — SODIUM CHLORIDE 0.9 % IJ SOLN
INTRAMUSCULAR | Status: AC
  Filled 2015-09-14: qty 10

## 2015-09-14 MED ORDER — LIDOCAINE HCL (CARDIAC) 20 MG/ML IV SOLN
INTRAVENOUS | Status: AC
  Filled 2015-09-14: qty 5

## 2015-09-14 MED ORDER — SCOPOLAMINE 1 MG/3DAYS TD PT72
MEDICATED_PATCH | TRANSDERMAL | Status: AC
  Filled 2015-09-14: qty 1

## 2015-09-14 MED ORDER — ONDANSETRON HCL 4 MG/2ML IJ SOLN
INTRAMUSCULAR | Status: AC
  Filled 2015-09-14: qty 2

## 2015-09-14 MED ORDER — BUPIVACAINE-EPINEPHRINE (PF) 0.25% -1:200000 IJ SOLN
INTRAMUSCULAR | Status: DC | PRN
  Administered 2015-09-14: 25 mL via PERINEURAL

## 2015-09-14 MED ORDER — OXYCODONE HCL 5 MG PO TABS
5.0000 mg | ORAL_TABLET | Freq: Once | ORAL | Status: AC | PRN
  Administered 2015-09-14: 5 mg via ORAL

## 2015-09-14 MED ORDER — CEFAZOLIN SODIUM-DEXTROSE 2-3 GM-% IV SOLR
INTRAVENOUS | Status: AC
  Filled 2015-09-14: qty 50

## 2015-09-14 MED ORDER — HYDROMORPHONE HCL 1 MG/ML IJ SOLN: 0.2500 mg | INTRAMUSCULAR | Status: DC | PRN

## 2015-09-14 MED ORDER — HYDROCODONE-ACETAMINOPHEN 10-325 MG PO TABS: 1.0000 | ORAL_TABLET | Freq: Four times a day (QID) | ORAL | Status: DC | PRN

## 2015-09-14 MED ORDER — METHYLENE BLUE 1 % INJ SOLN
INTRAMUSCULAR | Status: AC
  Filled 2015-09-14: qty 10

## 2015-09-14 MED ORDER — BUPIVACAINE-EPINEPHRINE (PF) 0.25% -1:200000 IJ SOLN
INTRAMUSCULAR | Status: AC
  Filled 2015-09-14: qty 30

## 2015-09-14 MED ORDER — MIDAZOLAM HCL 2 MG/2ML IJ SOLN
1.0000 mg | INTRAMUSCULAR | Status: DC | PRN
  Administered 2015-09-14: 2 mg via INTRAVENOUS

## 2015-09-14 MED ORDER — MEPERIDINE HCL 25 MG/ML IJ SOLN: 6.2500 mg | INTRAMUSCULAR | Status: DC | PRN

## 2015-09-14 MED ORDER — OXYCODONE HCL 5 MG/5ML PO SOLN: 5.0000 mg | Freq: Once | ORAL | Status: AC | PRN

## 2015-09-14 MED ORDER — MIDAZOLAM HCL 2 MG/2ML IJ SOLN
INTRAMUSCULAR | Status: AC
  Filled 2015-09-14: qty 2

## 2015-09-14 MED ORDER — DEXAMETHASONE SODIUM PHOSPHATE 10 MG/ML IJ SOLN
INTRAMUSCULAR | Status: AC
Start: 2015-09-14 — End: 2015-09-14
  Filled 2015-09-14: qty 1

## 2015-09-14 MED ORDER — FENTANYL CITRATE (PF) 100 MCG/2ML IJ SOLN
50.0000 ug | INTRAMUSCULAR | Status: AC | PRN
  Administered 2015-09-14: 25 ug via INTRAVENOUS
  Administered 2015-09-14: 50 ug via INTRAVENOUS
  Administered 2015-09-14: 100 ug via INTRAVENOUS
  Administered 2015-09-14: 25 ug via INTRAVENOUS

## 2015-09-14 MED ORDER — LACTATED RINGERS IV SOLN
INTRAVENOUS | Status: DC
  Administered 2015-09-14 (×2): via INTRAVENOUS

## 2015-09-14 MED ORDER — METHYLENE BLUE 1 % INJ SOLN
INTRAMUSCULAR | Status: DC | PRN
  Administered 2015-09-14: 5 mL via SUBCUTANEOUS

## 2015-09-14 MED ORDER — DEXAMETHASONE SODIUM PHOSPHATE 4 MG/ML IJ SOLN
INTRAMUSCULAR | Status: DC | PRN
  Administered 2015-09-14: 10 mg via INTRAVENOUS

## 2015-09-14 MED ORDER — PROPOFOL 500 MG/50ML IV EMUL
INTRAVENOUS | Status: AC
  Filled 2015-09-14: qty 50

## 2015-09-14 MED ORDER — LIDOCAINE HCL (CARDIAC) 20 MG/ML IV SOLN
INTRAVENOUS | Status: DC | PRN
  Administered 2015-09-14: 80 mg via INTRAVENOUS

## 2015-09-14 MED ORDER — CEFAZOLIN SODIUM-DEXTROSE 2-3 GM-% IV SOLR
2.0000 g | INTRAVENOUS | Status: AC
  Administered 2015-09-14: 2 g via INTRAVENOUS

## 2015-09-14 MED ORDER — TECHNETIUM TC 99M SULFUR COLLOID FILTERED
1.0000 | Freq: Once | INTRAVENOUS | Status: AC | PRN
  Administered 2015-09-14: 1 via INTRADERMAL

## 2015-09-14 SURGICAL SUPPLY — 63 items
APPLIER CLIP 9.375 MED OPEN (MISCELLANEOUS) ×2
BINDER BREAST LRG (GAUZE/BANDAGES/DRESSINGS) IMPLANT
BINDER BREAST MEDIUM (GAUZE/BANDAGES/DRESSINGS) IMPLANT
BINDER BREAST XLRG (GAUZE/BANDAGES/DRESSINGS) ×2 IMPLANT
BINDER BREAST XXLRG (GAUZE/BANDAGES/DRESSINGS) IMPLANT
BLADE SURG 15 STRL LF DISP TIS (BLADE) ×1 IMPLANT
BLADE SURG 15 STRL SS (BLADE) ×1
CANISTER SUC SOCK COL 7IN (MISCELLANEOUS) IMPLANT
CANISTER SUCT 1200ML W/VALVE (MISCELLANEOUS) IMPLANT
CHLORAPREP W/TINT 26ML (MISCELLANEOUS) ×2 IMPLANT
CLIP APPLIE 9.375 MED OPEN (MISCELLANEOUS) ×1 IMPLANT
COVER BACK TABLE 60X90IN (DRAPES) ×2 IMPLANT
COVER MAYO STAND STRL (DRAPES) ×2 IMPLANT
COVER PROBE W GEL 5X96 (DRAPES) ×2 IMPLANT
DECANTER SPIKE VIAL GLASS SM (MISCELLANEOUS) IMPLANT
DEVICE DUBIN W/COMP PLATE 8390 (MISCELLANEOUS) ×2 IMPLANT
DRAPE LAPAROSCOPIC ABDOMINAL (DRAPES) ×2 IMPLANT
DRAPE UTILITY XL STRL (DRAPES) ×2 IMPLANT
DRSG TEGADERM 4X4.75 (GAUZE/BANDAGES/DRESSINGS) IMPLANT
ELECT COATED BLADE 2.86 ST (ELECTRODE) ×2 IMPLANT
ELECT REM PT RETURN 9FT ADLT (ELECTROSURGICAL) ×2
ELECTRODE REM PT RTRN 9FT ADLT (ELECTROSURGICAL) ×1 IMPLANT
GLOVE BIO SURGEON STRL SZ7 (GLOVE) ×4 IMPLANT
GLOVE BIOGEL PI IND STRL 7.0 (GLOVE) ×1 IMPLANT
GLOVE BIOGEL PI IND STRL 7.5 (GLOVE) ×1 IMPLANT
GLOVE BIOGEL PI INDICATOR 7.0 (GLOVE) ×1
GLOVE BIOGEL PI INDICATOR 7.5 (GLOVE) ×1
GLOVE ECLIPSE 6.5 STRL STRAW (GLOVE) ×2 IMPLANT
GLOVE EXAM NITRILE LRG STRL (GLOVE) ×2 IMPLANT
GOWN STRL REUS W/ TWL LRG LVL3 (GOWN DISPOSABLE) ×2 IMPLANT
GOWN STRL REUS W/TWL LRG LVL3 (GOWN DISPOSABLE) ×2
ILLUMINATOR WAVEGUIDE N/F (MISCELLANEOUS) ×2 IMPLANT
KIT MARKER MARGIN INK (KITS) ×2 IMPLANT
LIGHT WAVEGUIDE WIDE FLAT (MISCELLANEOUS) IMPLANT
LIQUID BAND (GAUZE/BANDAGES/DRESSINGS) ×2 IMPLANT
MARKER SKIN DUAL TIP RULER LAB (MISCELLANEOUS) ×4 IMPLANT
NDL SAFETY ECLIPSE 18X1.5 (NEEDLE) ×1 IMPLANT
NEEDLE HYPO 18GX1.5 SHARP (NEEDLE) ×1
NEEDLE HYPO 25X1 1.5 SAFETY (NEEDLE) ×4 IMPLANT
NS IRRIG 1000ML POUR BTL (IV SOLUTION) ×2 IMPLANT
PACK BASIN DAY SURGERY FS (CUSTOM PROCEDURE TRAY) ×2 IMPLANT
PENCIL BUTTON HOLSTER BLD 10FT (ELECTRODE) ×2 IMPLANT
SHEET MEDIUM DRAPE 40X70 STRL (DRAPES) IMPLANT
SLEEVE SCD COMPRESS KNEE MED (MISCELLANEOUS) ×2 IMPLANT
SPONGE GAUZE 4X4 12PLY STER LF (GAUZE/BANDAGES/DRESSINGS) ×4 IMPLANT
SPONGE LAP 4X18 X RAY DECT (DISPOSABLE) ×2 IMPLANT
STAPLER VISISTAT 35W (STAPLE) ×2 IMPLANT
STOCKINETTE IMPERVIOUS LG (DRAPES) IMPLANT
STRIP CLOSURE SKIN 1/2X4 (GAUZE/BANDAGES/DRESSINGS) ×2 IMPLANT
SUT ETHILON 2 0 FS 18 (SUTURE) IMPLANT
SUT MNCRL AB 4-0 PS2 18 (SUTURE) ×2 IMPLANT
SUT MON AB 5-0 PS2 18 (SUTURE) IMPLANT
SUT SILK 2 0 SH (SUTURE) ×4 IMPLANT
SUT VIC AB 2-0 SH 27 (SUTURE) ×1
SUT VIC AB 2-0 SH 27XBRD (SUTURE) ×1 IMPLANT
SUT VIC AB 3-0 SH 27 (SUTURE) ×2
SUT VIC AB 3-0 SH 27X BRD (SUTURE) ×2 IMPLANT
SUT VIC AB 5-0 PS2 18 (SUTURE) IMPLANT
SYR CONTROL 10ML LL (SYRINGE) ×4 IMPLANT
TOWEL OR 17X24 6PK STRL BLUE (TOWEL DISPOSABLE) ×2 IMPLANT
TOWEL OR NON WOVEN STRL DISP B (DISPOSABLE) ×2 IMPLANT
TUBE CONNECTING 20X1/4 (TUBING) IMPLANT
YANKAUER SUCT BULB TIP NO VENT (SUCTIONS) IMPLANT

## 2015-09-14 NOTE — Anesthesia Postprocedure Evaluation (Signed)
  Anesthesia Post-op Note  Patient: Jessica Petty  Procedure(s) Performed: Procedure(s): RADIOACTIVE SEED GUIDED PARTIAL MASTECTOMY WITH AXILLARY SENTINEL LYMPH NODE BIOPSY (Right)  Patient Location: PACU  Anesthesia Type: General, Regional   Level of Consciousness: awake, alert  and oriented  Airway and Oxygen Therapy: Patient Spontanous Breathing  Post-op Pain: mild  Post-op Assessment: Post-op Vital signs reviewed  Post-op Vital Signs: Reviewed  Last Vitals:  Filed Vitals:   09/14/15 1045  BP: 142/96  Pulse: 84  Temp:   Resp: 22    Complications: No apparent anesthesia complications

## 2015-09-14 NOTE — Interval H&P Note (Signed)
History and Physical Interval Note:  09/14/2015 8:21 AM  Jessica Petty  has presented today for surgery, with the diagnosis of RIGHT BREAST CANCER  The various methods of treatment have been discussed with the patient and family. After consideration of risks, benefits and other options for treatment, the patient has consented to  Procedure(s): RADIOACTIVE SEED GUIDED PARTIAL MASTECTOMY WITH AXILLARY SENTINEL LYMPH NODE BIOPSY (Right) as a surgical intervention .  The patient's history has been reviewed, patient examined, no change in status, stable for surgery.  I have reviewed the patient's chart and labs.  Questions were answered to the patient's satisfaction.     Dong Nimmons

## 2015-09-14 NOTE — Progress Notes (Signed)
Assisted Dr. Crews with right, ultrasound guided, pectoralis block. Side rails up, monitors on throughout procedure. See vital signs in flow sheet. Tolerated Procedure well. 

## 2015-09-14 NOTE — Anesthesia Procedure Notes (Addendum)
Anesthesia Regional Block:  Pectoralis block  Pre-Anesthetic Checklist: ,, timeout performed, Correct Patient, Correct Site, Correct Laterality, Correct Procedure, Correct Position, site marked, Risks and benefits discussed,  Surgical consent,  Pre-op evaluation,  At surgeon's request and post-op pain management  Laterality: Right and Upper  Prep: chloraprep       Needles:  Injection technique: Single-shot  Needle Type: Echogenic Needle     Needle Length: 9cm 9 cm Needle Gauge: 21 and 21 G    Additional Needles:  Procedures: ultrasound guided (picture in chart) Pectoralis block Narrative:  Start time: 09/14/2015 8:00 AM End time: 09/14/2015 8:05 AM Injection made incrementally with aspirations every 5 mL.  Performed by: Personally  Anesthesiologist: CREWS, DAVID   Procedure Name: LMA Insertion Date/Time: 09/14/2015 8:40 AM Performed by: Baxter Flattery Pre-anesthesia Checklist: Patient identified, Emergency Drugs available, Suction available and Patient being monitored Patient Re-evaluated:Patient Re-evaluated prior to inductionOxygen Delivery Method: Circle System Utilized Preoxygenation: Pre-oxygenation with 100% oxygen Intubation Type: IV induction Ventilation: Mask ventilation without difficulty LMA: LMA inserted LMA Size: 4.0 Number of attempts: 1 Airway Equipment and Method: Bite block Placement Confirmation: positive ETCO2 and breath sounds checked- equal and bilateral Tube secured with: Tape Dental Injury: Teeth and Oropharynx as per pre-operative assessment       R chest wall image for PEC block

## 2015-09-14 NOTE — H&P (Signed)
39 yof who noted a right breast mass for about two months. this has no really changed. no nipple discharge. mm shows density C. there is a mass with obscured margin in the right breast at 1 oclock. the left breast is normal US shows a 1.2 cm irregular mass in the right breast. [place] of axilla is normal.  her path is IDC, grade II.  Other Problems Patsey Berthold, CMA; 08/17/2015 7:47 AM) Anxiety Disorder Breast Cancer Depression Lump In Breast  Past Surgical History Patsey Berthold, [place] [employer]; 08/17/2015 7:47 AM) Breast Biopsy Right. Oral Surgery  Diagnostic Studies History Patsey Berthold, [place]; 08/17/2015 7:47 AM) Mammogram within last year  Medication History Patsey Berthold, [place]; 08/17/2015 7:47 AM) No Current Medications Medications Reconciled  Social History Patsey Berthold, CMA; 08/17/2015 7:47 AM) Alcohol use Moderate alcohol use. No drug use Tobacco use Former smoker.  Family History Patsey Berthold, [place]; 08/17/2015 7:47 AM) Arthritis Mother. Cerebrovascular Accident Mother. Heart Disease Father. Heart disease in female family member before age 64 Hypertension Father.  Pregnancy / Birth History Patsey Berthold, [place]; 08/17/2015 7:47 AM) Age at menarche 83 years. Contraceptive History Intrauterine device. Gravida 1 Irregular periods Maternal age 86-25 Para 1  Review of Systems Patsey Berthold CMA; 08/17/2015 7:47 AM) General Not Present- Appetite Loss, Chills, Fatigue, Fever, Night Sweats, Weight Gain and Weight Loss. Skin Not Present- Change in Wart/Mole, Dryness, Hives, Jaundice, New Lesions, Non-Healing Wounds, Rash and Ulcer. HEENT Present- Wears glasses/contact lenses. Not Present- Earache, Hearing Loss, Hoarseness, Nose Bleed, Oral Ulcers, Ringing in the Ears, Seasonal Allergies, Sinus Pain, Sore Throat, Visual Disturbances and Yellow Eyes. Respiratory Present- Snoring. Not Present- Bloody sputum, Chronic Cough, Difficulty Breathing and Wheezing. Breast Present-  Breast Mass. Not Present- Breast Pain, Nipple Discharge and Skin Changes. Cardiovascular Present- Swelling of Extremities. Not Present- Chest Pain, Difficulty Breathing Lying Down, Leg Cramps, Palpitations, Rapid Heart Rate and Shortness of Breath. Gastrointestinal Not Present- Abdominal Pain, Bloating, Bloody Stool, Change in Bowel Habits, Chronic diarrhea, Constipation, Difficulty Swallowing, Excessive gas, Gets full quickly at meals, Hemorrhoids, Indigestion, Nausea, Rectal Pain and Vomiting. Female Genitourinary Not Present- Frequency, Nocturia, Painful Urination, Pelvic Pain and Urgency. Musculoskeletal Not Present- Back Pain, Joint Pain, Joint Stiffness, Muscle Pain, Muscle Weakness and Swelling of Extremities. Neurological Not Present- Decreased Memory, Fainting, Headaches, Numbness, Seizures, Tingling, Tremor, Trouble walking and Weakness. Psychiatric Present- Anxiety, Bipolar and Depression. Not Present- Change in Sleep Pattern, Fearful and Frequent crying. Endocrine Not Present- Cold Intolerance, Excessive Hunger, Hair Changes, Heat Intolerance, Hot flashes and New Diabetes. Hematology Not Present- Easy Bruising, Excessive bleeding, Gland problems, HIV and Persistent Infections.   Physical Exam Rolm Bookbinder MD; 08/17/2015 3:44 PM) General Mental Status-Alert. Orientation-Oriented X3. Chest and Lung Exam Chest and lung exam reveals -on auscultation, normal breath sounds, no adventitious sounds and normal vocal resonance. Breast Nipples-No Discharge. Cardiovascular Cardiovascular examination reveals -normal heart sounds, regular rate and rhythm with no murmurs. Lymphatic Head & Neck General Head & Neck Lymphatics: Bilateral - Description - Normal. Axillary General Axillary Region: Bilateral - Description - Normal. 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 We discussed the staging and pathophysiology of breast cancer. We discussed all of the different options for treatment for breast cancer including surgery, chemotherapy, radiation therapy, Herceptin, and antiestrogen therapy. We discussed a sentinel lymph node biopsy as she does  not appear to having lymph node involvement right now. We discussed the performance of that with injection of radioactive tracer. We discussed that there is a chance of having a positive node with a sentinel lymph node biopsy and we will await the permanent pathology to make any other first further decisions in terms of her treatment. We discussed up to a 5% risk lifetime of chronic shoulder pain as well as lymphedema associated with a sentinel lymph node biopsy. We discussed the options for treatment of the breast cancer which included lumpectomy versus a mastectomy. We discussed the performance of the lumpectomy with radioactive seed placement. We discussed a 5% chance of a positive margin requiring reexcision in the operating room. We also discussed that she will likely need radiation therapy (this is usually 5-7 weeks) if she undergoes lumpectomy. We discussed the mastectomy (removal of whole breast) and the postoperative care for that as well. Mastectomy can be followed by reconstruction. This is a more extensive surgery and requires more recovery. The decision for lumpectomy vs mastectomy has no impact on decision for chemotherapy. We discussed that there is no difference in her survival whether she undergoes lumpectomy with radiation therapy or antiestrogen therapy versus a mastectomy. There is also no real difference between her recurrence in the breast.

## 2015-09-14 NOTE — Op Note (Signed)
Preoperative diagnosis: stage I right breast cancer Postoperative diagnosis: same as above Procedure: 1. Right breast seed guided lumpectomy 2. Right axillary sn biopsy 3. Injection blue dye for sn identification Surgeon Dr Serita Grammes Anes general with pectoral block EBL: minimal Comps none Specimen:  1. Right breast marked with paint 2. Sentinel node packet with highest count 134, blue nodes 3. Additional posterior margin marked short superior,long lateral double deep Sponge count correct at completion dispo to recovery stable  Indications: this is a 38 yof with newly diagnosed stage I right breast cancer. We discussed proceeding with lumpectomy/sn biopsy. She had radioactive seed placed prior to beginning. I had these mm in the OR.   Procedure: After informed consent was obtained the patient was taken to the OR. She was injected with technetium in the standard periareolar fashion. She underwent a pectoral block. She was given ancef. SCDs were in place. She was prepped and draped in the standard sterile surgical fashion. A timeout was performed. I injected a mixture of methylene blue and saline in the periareolar position for sn identification as there was not a lot of tracer activity present.  I infiltrated marcaine in the skin.I made a periareolar incision and used the neoprobe to guide my way to the superomedial tumor. I then removed the seed with an attempt to get a clear margin.  I did confirm removal of both the seed and the clip with mammography. I was close to my posterior margin and removed this.  This is marked as above. . I then obtained hemostasis. Clips were placed in cavity. I then closed with 2-0 vicryl, 3-0 vicryl and 5-0 monocryl. Glue and steristrips were applied.  I then located the sentinel nodes. I made an incision below the axillary hairline. I went through the axillary fascia. I was able to locate what appeared to be several sentinel nodes with count listed  above. One of these appeared blue.  There were no more palpable or radioactive nodes present. I obtained hemostasis. I then closed the fascia with 2-0 vicryl The skin was closed with 3-0 vicryl and 4-0 monocryl. Glue and steristrips were applied. A breast binder was placed. She was extubated and transferred to recovery stable

## 2015-09-14 NOTE — Discharge Instructions (Signed)
[facility] °Office Phone Number [phone] ° °POST OP INSTRUCTIONS ° °Always review your discharge instruction sheet given to you by the facility where your surgery was performed. ° °IF YOU HAVE DISABILITY OR FAMILY LEAVE FORMS, YOU MUST BRING THEM TO THE OFFICE FOR PROCESSING.  DO NOT GIVE THEM TO YOUR DOCTOR. ° °1. A prescription for pain medication may be given to you upon discharge.  Take your pain medication as prescribed, if needed.  If narcotic pain medicine is not needed, then you may take acetaminophen (Tylenol), naprosyn (Alleve) or ibuprofen (Advil) as needed. °2. Take your usually prescribed medications unless otherwise directed °3. If you need a refill on your pain medication, please contact your pharmacy.  They will contact our office to request authorization.  Prescriptions will not be filled after 5pm or on week-ends. °4. You should eat very light the first 24 hours after surgery, such as soup, crackers, pudding, etc.  Resume your normal diet the day after surgery. °5. Most patients will experience some swelling and bruising in the breast.  Ice packs and a good support bra will help.  Wear the breast binder provided or a sports bra for 72 hours day and night.  After that wear a sports bra during the day until you return to the office. Swelling and bruising can take several days to resolve.  °6. It is common to experience some constipation if taking pain medication after surgery.  Increasing fluid intake and taking a stool softener will usually help or prevent this problem from occurring.  A mild laxative (Milk of Magnesia or Miralax) should be taken according to package directions if there are no bowel movements after 48 hours. °7. Unless discharge instructions indicate otherwise, you may remove your bandages 48 hours after surgery and you may shower at that time.  You may have steri-strips (small skin tapes) in place directly over the incision.  These strips should be left on the  skin for 7-10 days and will come off on their own.  If your surgeon used skin glue on the incision, you may shower in 24 hours.  The glue will flake off over the next 2-3 weeks.  Any sutures or staples will be removed at the office during your follow-up visit. °8. ACTIVITIES:  You may resume regular daily activities (gradually increasing) beginning the next day.  Wearing a good support bra or sports bra minimizes pain and swelling.  You may have sexual intercourse when it is comfortable. °a. You may drive when you no longer are taking prescription pain medication, you can comfortably wear a seatbelt, and you can safely maneuver your car and apply brakes. °b. RETURN TO WORK:  ______________________________________________________________________________________ °9. You should see your doctor in the office for a follow-up appointment approximately two weeks after your surgery.  Your doctor’s nurse will typically make your follow-up appointment when she calls you with your pathology report.  Expect your pathology report 3-4 business days after your surgery.  You may call to check if you do not hear from us after three days. °10. OTHER INSTRUCTIONS: _______________________________________________________________________________________________ _____________________________________________________________________________________________________________________________________ °_____________________________________________________________________________________________________________________________________ °_____________________________________________________________________________________________________________________________________ ° °WHEN TO CALL DR WAKEFIELD: °1. Fever over 101.0 °2. Nausea and/or vomiting. °3. Extreme swelling or bruising. °4. Continued bleeding from incision. °5. Increased pain, redness, or drainage from the incision. ° °The clinic staff is available to answer your questions during regular  business hours.  Please don’t hesitate to call and ask to speak to one of the nurses for clinical concerns.  If   you have a medical emergency, go to the nearest emergency room or call 911.  A surgeon from [facility] is always on call at the hospital. ° °For further questions, please visit [email or web address] ° ° ° °Post Anesthesia Home Care Instructions ° °Activity: °Get plenty of rest for the remainder of the day. A responsible adult should stay with you for 24 hours following the procedure.  °For the next 24 hours, DO NOT: °-Drive a car °-Operate machinery °-Drink alcoholic beverages °-Take any medication unless instructed by your physician °-Make any legal decisions or sign important papers. ° °Meals: °Start with liquid foods such as gelatin or soup. Progress to regular foods as tolerated. Avoid greasy, spicy, heavy foods. If nausea and/or vomiting occur, drink only clear liquids until the nausea and/or vomiting subsides. Call your physician if vomiting continues. ° °Special Instructions/Symptoms: °Your throat may feel dry or sore from the anesthesia or the breathing tube placed in your throat during surgery. If this causes discomfort, gargle with warm salt water. The discomfort should disappear within 24 hours. ° °If you had a scopolamine patch placed behind your ear for the management of post- operative nausea and/or vomiting: ° °1. The medication in the patch is effective for 72 hours, after which it should be removed.  Wrap patch in a tissue and discard in the trash. Wash hands thoroughly with soap and water. °2. You may remove the patch earlier than 72 hours if you experience unpleasant side effects which may include dry mouth, dizziness or visual disturbances. °3. Avoid touching the patch. Wash your hands with soap and water after contact with the patch. °  ° °

## 2015-09-14 NOTE — Transfer of Care (Signed)
Immediate Anesthesia Transfer of Care Note  Patient: Jessica Petty  Procedure(s) Performed: Procedure(s): RADIOACTIVE SEED GUIDED PARTIAL MASTECTOMY WITH AXILLARY SENTINEL LYMPH NODE BIOPSY (Right)  Patient Location: PACU  Anesthesia Type:GA combined with regional for post-op pain  Level of Consciousness: awake, alert  and oriented  Airway & Oxygen Therapy: Patient Spontanous Breathing and Patient connected to face mask oxygen  Post-op Assessment: Report given to RN, Post -op Vital signs reviewed and stable and Patient moving all extremities  Post vital signs: Reviewed and stable  Last Vitals:  Filed Vitals:   09/14/15 0820  BP:   Pulse: 95  Temp:   Resp: 21    Complications: No apparent anesthesia complications

## 2015-09-14 NOTE — Anesthesia Preprocedure Evaluation (Signed)
Anesthesia Evaluation  Patient identified by MRN, date of birth, ID band Patient awake    Reviewed: Allergy & Precautions, NPO status , Patient's Chart, lab work & pertinent test results  Airway Mallampati: I  TM Distance: >3 FB Neck ROM: Full    Dental  (+) Teeth Intact, Dental Advisory Given   Pulmonary former smoker,  breath sounds clear to auscultation        Cardiovascular Rhythm:Regular Rate:Normal     Neuro/Psych    GI/Hepatic   Endo/Other    Renal/GU      Musculoskeletal   Abdominal   Peds  Hematology   Anesthesia Other Findings   Reproductive/Obstetrics                             Anesthesia Physical Anesthesia Plan  ASA: II  Anesthesia Plan: General and Regional   Post-op Pain Management:    Induction: Intravenous  Airway Management Planned: LMA  Additional Equipment:   Intra-op Plan:   Post-operative Plan: Extubation in OR  Informed Consent: I have reviewed the patients History and Physical, chart, labs and discussed the procedure including the risks, benefits and alternatives for the proposed anesthesia with the patient or authorized representative who has indicated his/her understanding and acceptance.   Dental advisory given  Plan Discussed with: CRNA, Anesthesiologist and Surgeon  Anesthesia Plan Comments:         Anesthesia Quick Evaluation  

## 2015-09-15 ENCOUNTER — Encounter (HOSPITAL_BASED_OUTPATIENT_CLINIC_OR_DEPARTMENT_OTHER): Payer: Self-pay | Admitting: General Surgery

## 2015-09-20 NOTE — Assessment & Plan Note (Signed)
Right breast Lumpectomy 09/14/15: IDC Grade 2/3, 1.2 cm, LVI Present, 0/1 LN T1CN0 (Stage 1A) ER/PR and HER-2/neu pending, T1 cN0 stage IA clinical stage  Path Counselling: Discussed the final path and provided her with a copy of the report Recommendation: Oncotype Dx test  RTC based on Oncotype results

## 2015-09-21 ENCOUNTER — Telehealth: Payer: Self-pay | Admitting: Hematology and Oncology

## 2015-09-21 ENCOUNTER — Encounter: Payer: Self-pay | Admitting: Hematology and Oncology

## 2015-09-21 ENCOUNTER — Ambulatory Visit (HOSPITAL_BASED_OUTPATIENT_CLINIC_OR_DEPARTMENT_OTHER): Payer: BLUE CROSS/BLUE SHIELD | Admitting: Hematology and Oncology

## 2015-09-21 VITALS — BP 122/78 | HR 88 | Temp 97.8°F | Resp 18 | Ht 61.0 in | Wt 184.6 lb

## 2015-09-21 DIAGNOSIS — C50211 Malignant neoplasm of upper-inner quadrant of right female breast: Secondary | ICD-10-CM

## 2015-09-21 NOTE — Progress Notes (Signed)
Patient Care Team: Brien Few, MD as Consulting Physician (Obstetrics and Gynecology) Rolm Bookbinder, MD as Consulting Physician (General Surgery) Nicholas Lose, MD as Consulting Physician (Hematology and Oncology) Arloa Koh, MD as Consulting Physician (Radiation Oncology) Rockwell Germany, RN as Registered Nurse Mauro Kaufmann, RN as Registered Nurse Sylvan Cheese, NP as Nurse Practitioner (Nurse Practitioner)  DIAGNOSIS: Breast cancer of upper-inner quadrant of right female breast [facility])   Staging form: Breast, AJCC 7th Edition     Clinical stage from 08/17/2015: Stage IA (T1c, N0, M0) - Unsigned       Staging comments: Staged at breast conference on 9.7.16  SUMMARY OF ONCOLOGIC HISTORY:   Breast cancer of upper-inner quadrant of right female breast ([place])   08/10/2015 Mammogram Right breast mass 1.2 cm irregular spiculated   08/11/2015 Initial Diagnosis 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 She had genetics testing which were normal. She is recovering very well from surgery and reports no major problems or concerns.  REVIEW OF SYSTEMS:   Constitutional: Denies fevers, chills or abnormal weight loss Eyes: Denies blurriness of vision Ears, nose, mouth, throat, and face: Denies mucositis or sore throat Respiratory: Denies cough, dyspnea or wheezes Cardiovascular: Denies palpitation, chest discomfort or lower  extremity swelling Gastrointestinal:  Denies nausea, heartburn or change in bowel habits Skin: Denies abnormal skin rashes Lymphatics: Denies new lymphadenopathy or easy bruising Neurological:Denies numbness, tingling or new weaknesses Behavioral/Psych: Mood is stable, no new changes  Breast: soreness from recent surgery. All other systems were reviewed with the patient and are negative.  I have reviewed the past medical history, past surgical history, social history and family history with the patient and they are unchanged from previous note.  ALLERGIES:  has No Known Allergies.  MEDICATIONS:  Current Outpatient Prescriptions  Medication Sig Dispense Refill  . ADDERALL XR 20 MG 24 hr capsule Take 20 mg by mouth.  0  . ALPRAZolam (XANAX) 1 MG tablet Take 1 mg by mouth 3 (three) times daily as needed.  5  . ARIPiprazole (ABILIFY) 10 MG tablet Take 10 mg by mouth at bedtime.  11  . HYDROcodone-acetaminophen (NORCO) 10-325 MG tablet Take 1 tablet by mouth every 6 (six) hours as needed. 20 tablet 0   No current facility-administered medications for this visit.    PHYSICAL EXAMINATION: ECOG PERFORMANCE STATUS: 1 - Symptomatic but completely ambulatory  Filed Vitals:   09/21/15 1504  BP: 122/78  Pulse: 88  Temp: 97.8 F (36.6 C)  Resp: 18   Filed Weights   09/21/15 1504  Weight: 184 lb 9.6 oz (83.734 kg)    GENERAL:alert, no distress and comfortable SKIN: skin color, texture, turgor are normal, no rashes or significant lesions EYES: normal, Conjunctiva are pink and non-injected, sclera clear OROPHARYNX:no exudate, no erythema and lips, buccal mucosa, and tongue normal  NECK: supple, thyroid normal size, non-tender, without nodularity LYMPH:  no palpable lymphadenopathy in the cervical, axillary or inguinal LUNGS: clear to auscultation and  percussion with normal breathing effort HEART: regular rate & rhythm and no murmurs and no lower extremity edema ABDOMEN:abdomen soft,  non-tender and normal bowel sounds Musculoskeletal:no cyanosis of digits and no clubbing  NEURO: alert & oriented x 3 with fluent speech, no focal motor/sensory deficits  LABORATORY DATA:  I have reviewed the data as listed   Chemistry      Component Value Date/Time   NA 140 08/17/2015 1247   K 3.9 08/17/2015 1247   CO2 25 08/17/2015 1247   BUN 9.1 08/17/2015 1247   CREATININE 0.8 08/17/2015 1247      Component Value Date/Time   CALCIUM 9.5 08/17/2015 1247   ALKPHOS 70 08/17/2015 1247   AST 18 08/17/2015 1247   ALT 22 08/17/2015 1247   BILITOT 0.53 08/17/2015 1247       Lab Results  Component Value Date   WBC 8.4 08/17/2015   HGB 14.1 08/17/2015   HCT 40.5 08/17/2015   MCV 80.7 08/17/2015   PLT 228 08/17/2015   NEUTROABS 6.2 08/17/2015   ASSESSMENT & PLAN:  Breast cancer of upper-inner quadrant of right female breast Right breast Lumpectomy 09/14/15: IDC Grade 2/3, 1.2 cm, LVI Present, 0/1 LN T1CN0 (Stage 1A) ER/PR and HER-2/neu pending, T1 cN0 stage IA clinical stage  Path Counselling: Discussed the final path and provided her with a copy of the report I discussed that she has to close margins for DCIS. Posterior and inferior margin. I did not feel that she would need any additional surgery. The important thing is that the margins are negative. I also discussed the significance of lymphovascular invasion. This along with grade 2-3 are concerning her high risk disease.  Recommendation: Oncotype Dx test Oncotype counseling: I discussed Oncotype DX test. I explained to the patient that this is a 21 gene panel to evaluate patient tumors DNA to calculate recurrence score. This would help determine whether patient has high risk or intermediate risk or low risk breast cancer. She understands that if her tumor was found to be high risk, she would benefit from systemic chemotherapy. If low risk, no need of chemotherapy. If she was found to be intermediate risk, we would need to  evaluate the score as well as other risk factors and determine if an abbreviated chemotherapy may be of benefit.  Her daughter was diagnosed with strep throat and she is very worried that she could Infection. She is working part time mostly doing desk work.  RTC based on Oncotype results  No orders of the defined types were placed in this encounter.   The patient has a good understanding of the overall plan. she agrees with it. she will call with any problems that may develop before the next visit here.   Rulon Eisenmenger, MD 09/21/2015

## 2015-09-21 NOTE — Telephone Encounter (Signed)
s.w pt and confirmed appt. °

## 2015-09-22 ENCOUNTER — Encounter: Payer: Self-pay | Admitting: *Deleted

## 2015-09-22 ENCOUNTER — Telehealth: Payer: Self-pay | Admitting: *Deleted

## 2015-09-22 NOTE — Telephone Encounter (Signed)
Received order from Dr. Lindi Adie for oncotype testing. Requisition sent to pathology. PAC sent to [employer] and [employer].

## 2015-09-22 NOTE — Progress Notes (Signed)
Received mammo report from [facility], sent to scan. 

## 2015-09-27 NOTE — Progress Notes (Signed)
Location of Breast Cancer:Right Breast  Histology per Pathology Report:   Diagnosis 09/14/15 1. Lymph node, sentinel, biopsy, right axilla - THERE IS NO EVIDENCE OF CARCINOMA IN 1 OF 1 LYMPH NODE (0/1). 2. 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 - DUCTAL CARCINOMA IN SITU, INTERMEDIATE GRADE. - LYMPHOVASCULAR INVASION IS IDENTIFIED. - THE SURGICAL RESECTION MARGINS ARE NEGATIVE FOR CARCINOMA. - SEE ONCOLOGY TABLE BELOW. 1 of 4 FINAL for Jessica Petty, Jessica Petty ([id number]) Diagnosis(continued) 3. Breast, excision, right posterior margin - DUCTAL CARCINOMA IN SITU, HIGH GRADE. - DUCTAL CARCINOMA IN SITU IS FOCALLY LESS THAN 0.1 CM TO THE POSTERIOR MARGIN OF SPECIMEN # 3 AND FOCALLY LESS THAN 0.1 CM TO THE INFERIOR MARGIN OF SPECIMEN # 3.  Receptor Status: ER(100%), PR (100%), Her2-neu (), Ki-67(10%)  Jessica Petty presented with a palpable mass along the medial aspect of the right breast, upper inner quadrant. She underwent mammography at [facility] on 08/10/2015. She was felt to have an irregular mass within the right breast at 1:00. There was architectural distortion. She had a 1.2 cm mass seen on ultrasound for which a biopsy was recommended. Biopsy on 08/11/2015 was diagnostic for invasive ductal carcinoma/DCIS   Past/Anticipated interventions by surgeon, if any: Lumpectomy right Breast  Past/Anticipated interventions by medical oncology, if any: Ordered Oncotype Dx testing on 09/21/15  Lymphedema issues, if any: None  Pain issues, if any: None  SAFETY ISSUES:  Prior radiation: No  Pacemaker/ICD? No  Possible current pregnancy?No  Is the patient on methotrexate? No  Current Complaints / other details:   Menarche age 51, IUD ~ 5-6 years, G1, P1,     Jessica Petty, Jessica Curb, RN 09/27/2015,1:46 PM

## 2015-09-28 ENCOUNTER — Encounter: Payer: Self-pay | Admitting: Radiation Oncology

## 2015-09-28 ENCOUNTER — Ambulatory Visit
Admission: RE | Admit: 2015-09-28 | Discharge: 2015-09-28 | Disposition: A | Discharge status: Home / Self Care | Payer: BLUE CROSS/BLUE SHIELD | Source: Ambulatory Visit | Attending: Radiation Oncology | Admitting: Radiation Oncology

## 2015-09-28 VITALS — BP 127/73 | HR 87 | Temp 98.5°F | Ht 61.0 in | Wt 185.5 lb

## 2015-09-28 DIAGNOSIS — C50911 Malignant neoplasm of unspecified site of right female breast: Secondary | ICD-10-CM | POA: Diagnosis not present

## 2015-09-28 DIAGNOSIS — Z170 Estrogen receptor positive status [ER+]: Secondary | ICD-10-CM | POA: Diagnosis not present

## 2015-09-28 DIAGNOSIS — C50211 Malignant neoplasm of upper-inner quadrant of right female breast: Secondary | ICD-10-CM

## 2015-09-28 NOTE — Progress Notes (Signed)
CC: Dr. Matthew Wakefield, Dr. 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 Jessica Petty is a pleasant 39-year-old female who is seen today for review and the management of her T1 N0 invasive ductal/DCIS of the right breast.  I first saw the patient at the multidisciplinary clinic on 08/17/2015. She presented with a palpable mass along the medial aspect of the right breast, upper inner quadrant. She underwent mammography at [facility] on 08/10/2015. She was felt to have an irregular mass within the right breast at 1:00. There was architectural distortion. She had a 1.2 cm mass seen on ultrasound for which a biopsy was recommended. Biopsy on 08/11/2015 was diagnostic for invasive ductal carcinoma/DCIS.Her invasive disease was strongly ER positive at 100% and PR positive at 100%.  HER-2/neu was not amplified.  Ki-67 was 20%.  On 09/14/2015 she underwent a right partial mastectomy and sentinel lymph node biopsy.  She was found to have a 1.2 cm invasive ductal carcinoma with surgical margins greater than 0.2 cm.  She is also found to have DCIS which was focally less than 0.1 cm to the posterior margin (pectoralis fascia) and also less than 0.1 cm to the inferior margin.  Her DCIS was intermediate to high grade.  Dr. Wakefield re-excised the posterior margin after his initial excision.  He went down to the pectoralis fascia.  A single sentinel lymph node was negative for metastatic disease.  Dr. Gudena has ordered Oncotype DX testing and this is pending.  She will see Dr. Wakefield tomorrow.  Physical examination: Alert and oriented. Filed Vitals:   09/28/15 0911  BP: 127/73  Pulse: 87  Temp: 98.5 F (36.9 C)   Nodes: There is no palpable cervical, supraclavicular, or axillary lymphadenopathy.  Breasts: There is a periareolar partial mastectomy wound along the right breast extending from approximately 12 to 3:00.  Steri-Strips are intact.   There is some ecchymosis along the upper inner quadrant.  No discreet masses are appreciated.  Cosmesis is excellent.  Extremities: Without edema.  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  With respect to her local management options, I would feel comfortable with either reexcision or proceeding with radiation therapy without reexcision.  We discussed "treatment guidelines" which favor a 1-2 mm margin around DCIS.  In favor re-excision would be her relatively young age with intermediate to high-grade DCIS.  Re-excision of DCIS would not affect her overall survival, but may minimally improve local control of DCIS in the range of single digits.  From a treatment standpoint, I would favor standard fractionation in this young patient.  We again discussed the potential acute and late toxicities of radiation therapy.  She will visit Dr. Wakefield tomorrow and then make up her mind as to whether not she wants to proceed with re-excision.  Again, I'm okay either way.  Her prognosis appears to be good excellent.  We are also awaiting Oncotype DX testing to determine whether not she would benefit from adjuvant chemotherapy.  Plan: As above.  30 minutes was spent face-to-face the patient, primarily counseling patient and coordinating her care. 

## 2015-09-28 NOTE — Addendum Note (Signed)
Encounter addended by: Benn Moulder, RN on: 09/28/2015  5:35 PM<BR>     Documentation filed: Charges VN

## 2015-09-29 ENCOUNTER — Encounter (HOSPITAL_COMMUNITY): Payer: Self-pay

## 2015-09-29 NOTE — Assessment & Plan Note (Signed)
Right Lumpectomy: 09/14/2015 IDC Grade 2/3, 1.2 cm, LVI Present, 0/1 LN T1CN0 (Stage 1A) Oncotype DX 44, 30% ROR Oncotype DX counseling: I discussed the result of Oncotype DX scoring provided her with a copy of this report. Based upon her risk score of 44, with tamoxifen alone the risk of recurrence of 30%. This is very high risk and I recommended systemic chemotherapy.  Treatment plan: 1. Adjuvant chemotherapy with dose dense Adriamycin and Cytoxan every 2 weeks 4 followed by Abraxane weekly 12 2. Followed by radiation 3. Followed by antiestrogen therapy  Plan: 1. Port Placement 2. Echocardiogram 3. Chemotherapy class 4. Antiemetic counseling 5. Start chemotherapy

## 2015-09-30 ENCOUNTER — Telehealth: Payer: Self-pay | Admitting: Hematology and Oncology

## 2015-09-30 ENCOUNTER — Ambulatory Visit (HOSPITAL_BASED_OUTPATIENT_CLINIC_OR_DEPARTMENT_OTHER): Payer: BLUE CROSS/BLUE SHIELD | Admitting: Hematology and Oncology

## 2015-09-30 ENCOUNTER — Other Ambulatory Visit: Payer: Self-pay | Admitting: General Surgery

## 2015-09-30 ENCOUNTER — Encounter: Payer: Self-pay | Admitting: *Deleted

## 2015-09-30 ENCOUNTER — Encounter: Payer: Self-pay | Admitting: Hematology and Oncology

## 2015-09-30 VITALS — BP 136/85 | HR 110 | Temp 97.8°F | Resp 18 | Ht 61.0 in | Wt 186.1 lb

## 2015-09-30 DIAGNOSIS — C50211 Malignant neoplasm of upper-inner quadrant of right female breast: Secondary | ICD-10-CM

## 2015-09-30 MED ORDER — LORAZEPAM 0.5 MG PO TABS: 0.5000 mg | ORAL_TABLET | Freq: Every day | ORAL | Status: DC

## 2015-09-30 MED ORDER — PROCHLORPERAZINE MALEATE 10 MG PO TABS: 10.0000 mg | ORAL_TABLET | Freq: Four times a day (QID) | ORAL | Status: DC | PRN

## 2015-09-30 MED ORDER — LIDOCAINE-PRILOCAINE 2.5-2.5 % EX CREA: TOPICAL_CREAM | CUTANEOUS | Status: DC

## 2015-09-30 MED ORDER — LIDOCAINE-PRILOCAINE 2.5-2.5 % EX CREA
TOPICAL_CREAM | CUTANEOUS | Status: DC
Start: 2015-09-30 — End: 2016-01-04

## 2015-09-30 MED ORDER — ONDANSETRON HCL 8 MG PO TABS: 8.0000 mg | ORAL_TABLET | Freq: Two times a day (BID) | ORAL | Status: DC | PRN

## 2015-09-30 MED ORDER — DEXAMETHASONE 4 MG PO TABS: ORAL_TABLET | ORAL | Status: DC

## 2015-09-30 NOTE — Telephone Encounter (Signed)
SPOKE WITH PATIENT RE ECHO FOR 11/4 @ 10 AM. [place] #[id number].

## 2015-09-30 NOTE — Telephone Encounter (Signed)
GAVE PATIENT AVS REPORT AND APPOINTMENTS FOR October AND November. ECHO TO LINDA FOR PRE [place].

## 2015-09-30 NOTE — Progress Notes (Signed)
[facility] Psychosocial Distress Screening Clinical Social Work  Clinical Social Work was referred by distress screening protocol.  The patient scored a 6 on the Psychosocial Distress Thermometer which indicates mild distress. Per RN, patient requested that she does not desire referral to Clinical Social Work at this time.  ONCBCN DISTRESS SCREENING 09/28/2015  Screening Type Initial Screening  Distress experienced in past week (1-10) 6  Practical problem type Work/school  Emotional problem type Nervousness/Anxiety;Adjusting to illness  Information Concerns Type   Physical Problem type Sleep/insomnia  Physician notified of physical symptoms Yes  Referral to support programs   Other     Clinical Social Worker follow up needed: No.  If yes, follow up plan:  Polo Riley, MSW, LCSW, OSW-C Clinical Social Worker [facility] [phone]

## 2015-09-30 NOTE — Progress Notes (Signed)
Patient Care Team: Brien Few, MD as Consulting Physician (Obstetrics and Gynecology) Rolm Bookbinder, MD as Consulting Physician (General Surgery) Nicholas Lose, MD as Consulting Physician (Hematology and Oncology) Arloa Koh, MD as Consulting Physician (Radiation Oncology) Rockwell Germany, RN as Registered Nurse Mauro Kaufmann, RN as Registered Nurse Sylvan Cheese, NP as Nurse Practitioner (Nurse Practitioner)  DIAGNOSIS: Breast cancer of upper-inner quadrant of right female breast [facility])   Staging form: Breast, AJCC 7th Edition     Clinical stage from 08/17/2015: Stage IA (T1c, N0, M0) - Unsigned       Staging comments: Staged at breast conference on 9.7.16    SUMMARY OF ONCOLOGIC HISTORY:   Breast cancer of upper-inner quadrant of right female breast ([facility])   08/10/2015 Mammogram Right breast mass 1.2 cm irregular spiculated   08/11/2015 Initial Diagnosis 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, grade 2-3   08/29/2015 Procedure  genetic testing normal (Genes tested includeATM, BARD1, BRCA1, BRCA2, BRIP1, CDH1, CHEK2, FANCC, MLH1, MSH2, MSH6, NBN, PALB2, PMS2, PTEN, RAD51C, RAD51D, TP53, and XRCC2)   09/14/2015 Surgery Right Lumpectomy: IDC Grade 2/3, 1.2 cm, LVI Present, 0/1 LN T1CN0 (Stage 1A) Oncotype DX 47, 30% ROR    CHIEF COMPLIANT: Follow-up to discuss Oncotype DX  INTERVAL HISTORY: TYTEANNA OST is a 39 year old with above-mentioned history of right-sided breast cancer who underwent lumpectomy and had Oncotype DX testing which came back a score of 44, high risk, 30% risk of recurrence with tamoxifen alone. She is here today to discuss the Oncotype DX score and chemotherapy plan.  REVIEW OF SYSTEMS:   Constitutional: Denies fevers, chills or abnormal weight loss Eyes: Denies blurriness of vision Ears, nose, mouth, throat, and face: Denies mucositis or sore throat Respiratory: Denies cough, dyspnea or wheezes Cardiovascular: Denies palpitation,  chest discomfort or lower extremity swelling Gastrointestinal:  Denies nausea, heartburn or change in bowel habits Skin: Denies abnormal skin rashes Lymphatics: Denies new lymphadenopathy or easy bruising Neurological:Denies numbness, tingling or new weaknesses Behavioral/Psych: Mood is stable, no new changes  Breast: Recovered very well from prior surgery. All other systems were reviewed with the patient and are negative.  I have reviewed the past medical history, past surgical history, social history and family history with the patient and they are unchanged from previous note.  ALLERGIES:  has No Known Allergies.  MEDICATIONS:  Current Outpatient Prescriptions  Medication Sig Dispense Refill  . ADDERALL XR 20 MG 24 hr capsule Take 20 mg by mouth.  0  . ALPRAZolam (XANAX) 1 MG tablet Take 1 mg by mouth 3 (three) times daily as needed.  5  . ARIPiprazole (ABILIFY) 10 MG tablet Take 10 mg by mouth at bedtime.  11  . HYDROcodone-acetaminophen (NORCO) 10-325 MG tablet Take 1 tablet by mouth every 6 (six) hours as needed. 20 tablet 0  . Vitamin D, Ergocalciferol, (DRISDOL) 50000 UNITS CAPS capsule TAKE 1 CAPSULE BY MOUTH 2 TIMES WEEKLY AS DIRECTED  12   No current facility-administered medications for this visit.    PHYSICAL EXAMINATION: ECOG PERFORMANCE STATUS: 1 - Symptomatic but completely ambulatory  Filed Vitals:   09/30/15 1249  BP: 136/85  Pulse: 110  Temp: 97.8 F (36.6 C)  Resp: 18   Filed Weights   09/30/15 1249  Weight: 186 lb 1.6 oz (84.414 kg)    GENERAL:alert, no distress and comfortable SKIN: skin color, texture, turgor are normal, no rashes or significant lesions EYES: normal, Conjunctiva are pink and non-injected, sclera clear OROPHARYNX:no  exudate, no erythema and lips, buccal mucosa, and tongue normal  NECK: supple, thyroid normal size, non-tender, without nodularity LYMPH:  no palpable lymphadenopathy in the cervical, axillary or inguinal LUNGS: clear  to auscultation and percussion with normal breathing effort HEART: regular rate & rhythm and no murmurs and no lower extremity edema ABDOMEN:abdomen soft, non-tender and normal bowel sounds Musculoskeletal:no cyanosis of digits and no clubbing  NEURO: alert & oriented x 3 with fluent speech, no focal motor/sensory deficits   LABORATORY DATA:  I have reviewed the data as listed   Chemistry      Component Value Date/Time   NA 140 08/17/2015 1247   K 3.9 08/17/2015 1247   CO2 25 08/17/2015 1247   BUN 9.1 08/17/2015 1247   CREATININE 0.8 08/17/2015 1247      Component Value Date/Time   CALCIUM 9.5 08/17/2015 1247   ALKPHOS 70 08/17/2015 1247   AST 18 08/17/2015 1247   ALT 22 08/17/2015 1247   BILITOT 0.53 08/17/2015 1247       Lab Results  Component Value Date   WBC 8.4 08/17/2015   HGB 14.1 08/17/2015   HCT 40.5 08/17/2015   MCV 80.7 08/17/2015   PLT 228 08/17/2015   NEUTROABS 6.2 08/17/2015   ASSESSMENT & PLAN:  Breast cancer of upper-inner quadrant of right female breast Right Lumpectomy: 09/14/2015 IDC Grade 2/3, 1.2 cm, LVI Present, 0/1 LN T1CN0 (Stage 1A) Oncotype DX 44, 30% ROR Oncotype DX counseling: I discussed the result of Oncotype DX scoring provided her with a copy of this report. Based upon her risk score of 44, with tamoxifen alone the risk of recurrence of 30%. This is very high risk and I recommended systemic chemotherapy.  Treatment plan: 1. Adjuvant chemotherapy with dose dense Adriamycin and Cytoxan every 2 weeks 4 followed by Abraxane weekly 12 2. Followed by radiation 3. Followed by antiestrogen therapy  Dr. Donne Hazel plans to take her to surgery for resection of the margins. I will request him to place a port at the same time.  Plan: 1. Port Placement 2. Echocardiogram 3. Chemotherapy class 4. Antiemetic counseling 5. Start chemotherapy November 30th  Chemotherapy counseling: I have discussed the risks and benefits of chemotherapy including  the risks of nausea/ vomiting, risk of infection from low WBC count, fatigue due to chemo or anemia, bruising or bleeding due to low platelets, mouth sores, loss/ change in taste and decreased appetite. Liver and kidney function will be monitored through out chemotherapy as abnormalities in liver and kidney function may be a side effect of treatment. Cardiac dysfunction due to Adriamycin was discussed in detail. Risk of permanent bone marrow dysfunction and leukemia due to chemo were also discussed.  Patient plans to work throughout chemotherapy. I discussed with her about fertility issues. Patient does not want to become pregnant and does not want to have any kids. She understands that chemotherapy could make her infertile.  I also discussed participation in PREVENT clinical trial PREVENT trial: [id number]  Newly diagnosed breast cancer Stages 1-3 scheduled to receive anthracycline randomized to atorvastatin 40 mg or placebo once daily for 24 months along with 3 cardiac MRIs or 2 years paid for by the trial. I discussed the risks and benefits of atorvastatin including the risk of myopathy and elevation of liver function tests. I explained the randomization process and patient is interested in the trial. I provided her with literature to read and provided her with the contact with the clinical trials nurse to  ask any questions regarding the trial.   No orders of the defined types were placed in this encounter.   The patient has a good understanding of the overall plan. she agrees with it. she will call with any problems that may develop before the next visit here.   Rulon Eisenmenger, MD 09/30/2015

## 2015-09-30 NOTE — Addendum Note (Signed)
Addended by: Prentiss Bells on: 09/30/2015 02:52 PM   Modules accepted: Orders, Medications

## 2015-10-02 ENCOUNTER — Encounter: Payer: Self-pay | Admitting: Radiation Oncology

## 2015-10-02 NOTE — Progress Notes (Signed)
Chart note: Jessica Petty has a high Oncotype DX score and will be receiving chemotherapy.  She has decided to have a reexcision of her tumor bed at the time of her port placement.

## 2015-10-05 ENCOUNTER — Encounter (HOSPITAL_BASED_OUTPATIENT_CLINIC_OR_DEPARTMENT_OTHER): Payer: Self-pay | Admitting: *Deleted

## 2015-10-07 ENCOUNTER — Ambulatory Visit (HOSPITAL_COMMUNITY)
Admission: RE | Admit: 2015-10-07 | Discharge: 2015-10-07 | Disposition: A | Discharge status: Home / Self Care | Payer: BLUE CROSS/BLUE SHIELD | Source: Ambulatory Visit | Attending: Hematology and Oncology | Admitting: Hematology and Oncology

## 2015-10-07 DIAGNOSIS — C50211 Malignant neoplasm of upper-inner quadrant of right female breast: Secondary | ICD-10-CM | POA: Diagnosis not present

## 2015-10-07 NOTE — Progress Notes (Signed)
  Echocardiogram 2D Echocardiogram has been performed.  Jennette Dubin 10/07/2015, 3:08 PM

## 2015-10-11 ENCOUNTER — Encounter: Payer: Self-pay | Admitting: *Deleted

## 2015-10-11 ENCOUNTER — Other Ambulatory Visit: Payer: BLUE CROSS/BLUE SHIELD

## 2015-10-11 NOTE — Anesthesia Preprocedure Evaluation (Addendum)
Anesthesia Evaluation  Patient identified by MRN, date of birth, ID band Patient awake    Reviewed: Allergy & Precautions, NPO status , Patient's Chart, lab work & pertinent test results  Airway Mallampati: I  TM Distance: >3 FB Neck ROM: Full    Dental  (+) Teeth Intact, Dental Advisory Given   Pulmonary former smoker,    breath sounds clear to auscultation       Cardiovascular negative cardio ROS   Rhythm:Regular Rate:Normal     Neuro/Psych PSYCHIATRIC DISORDERS Anxiety    GI/Hepatic negative GI ROS, Neg liver ROS,   Endo/Other  negative endocrine ROS  Renal/GU negative Renal ROS  negative genitourinary   Musculoskeletal negative musculoskeletal ROS (+)   Abdominal   Peds negative pediatric ROS (+)  Hematology negative hematology ROS (+)   Anesthesia Other Findings ADHD  Reproductive/Obstetrics negative OB ROS                          Anesthesia Physical Anesthesia Plan  ASA: II  Anesthesia Plan: General   Post-op Pain Management:    Induction: Intravenous  Airway Management Planned: LMA  Additional Equipment:   Intra-op Plan:   Post-operative Plan: Extubation in OR  Informed Consent: I have reviewed the patients History and Physical, chart, labs and discussed the procedure including the risks, benefits and alternatives for the proposed anesthesia with the patient or authorized representative who has indicated his/her understanding and acceptance.   Dental advisory given  Plan Discussed with: CRNA, Anesthesiologist and Surgeon  Anesthesia Plan Comments:       Anesthesia Quick Evaluation

## 2015-10-12 ENCOUNTER — Ambulatory Visit (HOSPITAL_BASED_OUTPATIENT_CLINIC_OR_DEPARTMENT_OTHER)
Admission: RE | Admit: 2015-10-12 | Discharge: 2015-10-12 | Disposition: A | Discharge status: Home / Self Care | Payer: BLUE CROSS/BLUE SHIELD | Source: Ambulatory Visit | Attending: General Surgery | Admitting: General Surgery

## 2015-10-12 ENCOUNTER — Ambulatory Visit (HOSPITAL_COMMUNITY): Payer: BLUE CROSS/BLUE SHIELD

## 2015-10-12 ENCOUNTER — Encounter (HOSPITAL_BASED_OUTPATIENT_CLINIC_OR_DEPARTMENT_OTHER)
Admission: RE | Disposition: A | Discharge status: Home / Self Care | Payer: Self-pay | Source: Ambulatory Visit | Attending: General Surgery

## 2015-10-12 ENCOUNTER — Ambulatory Visit (HOSPITAL_BASED_OUTPATIENT_CLINIC_OR_DEPARTMENT_OTHER): Payer: BLUE CROSS/BLUE SHIELD | Admitting: Anesthesiology

## 2015-10-12 ENCOUNTER — Encounter (HOSPITAL_BASED_OUTPATIENT_CLINIC_OR_DEPARTMENT_OTHER): Payer: Self-pay | Admitting: Certified Registered"

## 2015-10-12 DIAGNOSIS — C50411 Malignant neoplasm of upper-outer quadrant of right female breast: Secondary | ICD-10-CM | POA: Diagnosis not present

## 2015-10-12 DIAGNOSIS — Z87891 Personal history of nicotine dependence: Secondary | ICD-10-CM | POA: Diagnosis not present

## 2015-10-12 DIAGNOSIS — Z95828 Presence of other vascular implants and grafts: Secondary | ICD-10-CM

## 2015-10-12 DIAGNOSIS — C50919 Malignant neoplasm of unspecified site of unspecified female breast: Secondary | ICD-10-CM | POA: Diagnosis present

## 2015-10-12 DIAGNOSIS — Z419 Encounter for procedure for purposes other than remedying health state, unspecified: Secondary | ICD-10-CM

## 2015-10-12 HISTORY — PX: PORTACATH PLACEMENT: SHX2246

## 2015-10-12 HISTORY — PX: RE-EXCISION OF BREAST LUMPECTOMY: SHX6048

## 2015-10-12 HISTORY — DX: Attention-deficit hyperactivity disorder, unspecified type: F90.9

## 2015-10-12 SURGERY — EXCISION, LESION, BREAST
Anesthesia: General | Site: Breast | Laterality: Right

## 2015-10-12 MED ORDER — MEPERIDINE HCL 25 MG/ML IJ SOLN: 6.2500 mg | INTRAMUSCULAR | Status: DC | PRN

## 2015-10-12 MED ORDER — FENTANYL CITRATE (PF) 100 MCG/2ML IJ SOLN
50.0000 ug | INTRAMUSCULAR | Status: AC | PRN
  Administered 2015-10-12: 25 ug via INTRAVENOUS
  Administered 2015-10-12: 50 ug via INTRAVENOUS
  Administered 2015-10-12 (×5): 25 ug via INTRAVENOUS

## 2015-10-12 MED ORDER — HEPARIN (PORCINE) IN NACL 2-0.9 UNIT/ML-% IJ SOLN
INTRAMUSCULAR | Status: DC | PRN
  Administered 2015-10-12: 1 via INTRAVENOUS

## 2015-10-12 MED ORDER — DEXAMETHASONE SODIUM PHOSPHATE 4 MG/ML IJ SOLN
INTRAMUSCULAR | Status: DC | PRN
  Administered 2015-10-12: 10 mg via INTRAVENOUS

## 2015-10-12 MED ORDER — DEXAMETHASONE SODIUM PHOSPHATE 4 MG/ML IJ SOLN: INTRAMUSCULAR | Status: DC | PRN

## 2015-10-12 MED ORDER — LIDOCAINE HCL (CARDIAC) 20 MG/ML IV SOLN
INTRAVENOUS | Status: DC | PRN
  Administered 2015-10-12: 60 mg via INTRAVENOUS

## 2015-10-12 MED ORDER — SCOPOLAMINE 1 MG/3DAYS TD PT72: 1.0000 | MEDICATED_PATCH | Freq: Once | TRANSDERMAL | Status: DC | PRN

## 2015-10-12 MED ORDER — BUPIVACAINE HCL (PF) 0.25 % IJ SOLN
INTRAMUSCULAR | Status: DC | PRN
  Administered 2015-10-12: 10 mL

## 2015-10-12 MED ORDER — DEXAMETHASONE SODIUM PHOSPHATE 10 MG/ML IJ SOLN
INTRAMUSCULAR | Status: AC
  Filled 2015-10-12: qty 1

## 2015-10-12 MED ORDER — MIDAZOLAM HCL 2 MG/2ML IJ SOLN
1.0000 mg | INTRAMUSCULAR | Status: DC | PRN
  Administered 2015-10-12: 2 mg via INTRAVENOUS

## 2015-10-12 MED ORDER — HEPARIN SOD (PORK) LOCK FLUSH 100 UNIT/ML IV SOLN
INTRAVENOUS | Status: DC | PRN
  Administered 2015-10-12: 500 [IU] via INTRAVENOUS

## 2015-10-12 MED ORDER — OXYCODONE HCL 5 MG PO TABS
5.0000 mg | ORAL_TABLET | Freq: Once | ORAL | Status: AC
  Administered 2015-10-12: 5 mg via ORAL

## 2015-10-12 MED ORDER — ONDANSETRON HCL 4 MG/2ML IJ SOLN
INTRAMUSCULAR | Status: DC | PRN
  Administered 2015-10-12: 4 mg via INTRAVENOUS

## 2015-10-12 MED ORDER — MIDAZOLAM HCL 2 MG/2ML IJ SOLN
INTRAMUSCULAR | Status: AC
  Filled 2015-10-12: qty 4

## 2015-10-12 MED ORDER — HYDROMORPHONE HCL 1 MG/ML IJ SOLN
INTRAMUSCULAR | Status: AC
  Filled 2015-10-12: qty 1

## 2015-10-12 MED ORDER — HEPARIN SOD (PORK) LOCK FLUSH 100 UNIT/ML IV SOLN
INTRAVENOUS | Status: AC
  Filled 2015-10-12: qty 5

## 2015-10-12 MED ORDER — PROPOFOL 500 MG/50ML IV EMUL
INTRAVENOUS | Status: AC
  Filled 2015-10-12: qty 50

## 2015-10-12 MED ORDER — BUPIVACAINE-EPINEPHRINE (PF) 0.25% -1:200000 IJ SOLN
INTRAMUSCULAR | Status: AC
  Filled 2015-10-12: qty 30

## 2015-10-12 MED ORDER — FENTANYL CITRATE (PF) 100 MCG/2ML IJ SOLN
INTRAMUSCULAR | Status: AC
  Filled 2015-10-12: qty 4

## 2015-10-12 MED ORDER — ONDANSETRON HCL 4 MG/2ML IJ SOLN
INTRAMUSCULAR | Status: AC
  Filled 2015-10-12: qty 2

## 2015-10-12 MED ORDER — LACTATED RINGERS IV SOLN
INTRAVENOUS | Status: DC
  Administered 2015-10-12 (×2): via INTRAVENOUS

## 2015-10-12 MED ORDER — OXYCODONE HCL 5 MG PO TABS
ORAL_TABLET | ORAL | Status: AC
  Filled 2015-10-12: qty 1

## 2015-10-12 MED ORDER — LIDOCAINE HCL (CARDIAC) 20 MG/ML IV SOLN
INTRAVENOUS | Status: AC
  Filled 2015-10-12: qty 5

## 2015-10-12 MED ORDER — LACTATED RINGERS IV SOLN: INTRAVENOUS | Status: DC

## 2015-10-12 MED ORDER — CEFAZOLIN SODIUM-DEXTROSE 2-3 GM-% IV SOLR
INTRAVENOUS | Status: AC
  Filled 2015-10-12: qty 50

## 2015-10-12 MED ORDER — PROPOFOL 10 MG/ML IV BOLUS
INTRAVENOUS | Status: DC | PRN
  Administered 2015-10-12: 200 mg via INTRAVENOUS

## 2015-10-12 MED ORDER — BUPIVACAINE HCL (PF) 0.25 % IJ SOLN
INTRAMUSCULAR | Status: AC
  Filled 2015-10-12: qty 30

## 2015-10-12 MED ORDER — OXYCODONE-ACETAMINOPHEN 10-325 MG PO TABS: 1.0000 | ORAL_TABLET | Freq: Four times a day (QID) | ORAL | Status: DC | PRN

## 2015-10-12 MED ORDER — GLYCOPYRROLATE 0.2 MG/ML IJ SOLN
0.2000 mg | Freq: Once | INTRAMUSCULAR | Status: DC | PRN
Start: 2015-10-12 — End: 2015-10-12

## 2015-10-12 MED ORDER — HYDROMORPHONE HCL 1 MG/ML IJ SOLN
0.2500 mg | INTRAMUSCULAR | Status: DC | PRN
  Administered 2015-10-12 (×2): 0.5 mg via INTRAVENOUS

## 2015-10-12 MED ORDER — PROMETHAZINE HCL 25 MG/ML IJ SOLN: 6.2500 mg | INTRAMUSCULAR | Status: DC | PRN

## 2015-10-12 MED ORDER — CEFAZOLIN SODIUM-DEXTROSE 2-3 GM-% IV SOLR
2.0000 g | INTRAVENOUS | Status: AC
  Administered 2015-10-12: 2 g via INTRAVENOUS

## 2015-10-12 SURGICAL SUPPLY — 69 items
APPLIER CLIP 9.375 MED OPEN (MISCELLANEOUS) ×3
BAG DECANTER FOR FLEXI CONT (MISCELLANEOUS) ×3 IMPLANT
BENZOIN TINCTURE PRP APPL 2/3 (GAUZE/BANDAGES/DRESSINGS) ×3 IMPLANT
BINDER BREAST LRG (GAUZE/BANDAGES/DRESSINGS) IMPLANT
BINDER BREAST MEDIUM (GAUZE/BANDAGES/DRESSINGS) IMPLANT
BINDER BREAST XLRG (GAUZE/BANDAGES/DRESSINGS) IMPLANT
BINDER BREAST XXLRG (GAUZE/BANDAGES/DRESSINGS) IMPLANT
BLADE SURG 11 STRL SS (BLADE) ×3 IMPLANT
BLADE SURG 15 STRL LF DISP TIS (BLADE) ×2 IMPLANT
BLADE SURG 15 STRL SS (BLADE) ×1
CANISTER SUCT 1200ML W/VALVE (MISCELLANEOUS) IMPLANT
CHLORAPREP W/TINT 26ML (MISCELLANEOUS) ×3 IMPLANT
CLIP APPLIE 9.375 MED OPEN (MISCELLANEOUS) ×2 IMPLANT
COVER BACK TABLE 60X90IN (DRAPES) ×3 IMPLANT
COVER MAYO STAND STRL (DRAPES) ×3 IMPLANT
DECANTER SPIKE VIAL GLASS SM (MISCELLANEOUS) IMPLANT
DEVICE DUBIN W/COMP PLATE 8390 (MISCELLANEOUS) IMPLANT
DRAPE C-ARM 42X72 X-RAY (DRAPES) ×3 IMPLANT
DRAPE LAPAROSCOPIC ABDOMINAL (DRAPES) ×3 IMPLANT
DRSG TEGADERM 4X4.75 (GAUZE/BANDAGES/DRESSINGS) ×3 IMPLANT
ELECT COATED BLADE 2.86 ST (ELECTRODE) ×3 IMPLANT
ELECT REM PT RETURN 9FT ADLT (ELECTROSURGICAL) ×3
ELECTRODE REM PT RTRN 9FT ADLT (ELECTROSURGICAL) ×2 IMPLANT
GLOVE BIO SURGEON STRL SZ 6.5 (GLOVE) ×3 IMPLANT
GLOVE BIO SURGEON STRL SZ7 (GLOVE) ×9 IMPLANT
GLOVE BIOGEL PI IND STRL 7.0 (GLOVE) IMPLANT
GLOVE BIOGEL PI IND STRL 7.5 (GLOVE) ×4 IMPLANT
GLOVE BIOGEL PI INDICATOR 7.0 (GLOVE)
GLOVE BIOGEL PI INDICATOR 7.5 (GLOVE) ×2
GLOVE ECLIPSE 6.5 STRL STRAW (GLOVE) ×6 IMPLANT
GOWN STRL REUS W/ TWL LRG LVL3 (GOWN DISPOSABLE) ×8 IMPLANT
GOWN STRL REUS W/TWL LRG LVL3 (GOWN DISPOSABLE) ×4
ILLUMINATOR WAVEGUIDE N/F (MISCELLANEOUS) IMPLANT
IV KIT MINILOC 20X1 SAFETY (NEEDLE) IMPLANT
KIT PORT POWER 8FR ISP CVUE (Catheter) ×3 IMPLANT
LIGHT WAVEGUIDE WIDE FLAT (MISCELLANEOUS) IMPLANT
LIQUID BAND (GAUZE/BANDAGES/DRESSINGS) ×3 IMPLANT
MARKER SKIN DUAL TIP RULER LAB (MISCELLANEOUS) ×3 IMPLANT
NDL SAFETY ECLIPSE 18X1.5 (NEEDLE) IMPLANT
NEEDLE HYPO 18GX1.5 SHARP (NEEDLE)
NEEDLE HYPO 25X1 1.5 SAFETY (NEEDLE) ×3 IMPLANT
NS IRRIG 1000ML POUR BTL (IV SOLUTION) IMPLANT
PACK BASIN DAY SURGERY FS (CUSTOM PROCEDURE TRAY) ×3 IMPLANT
PENCIL BUTTON HOLSTER BLD 10FT (ELECTRODE) ×3 IMPLANT
SLEEVE SCD COMPRESS KNEE MED (MISCELLANEOUS) ×3 IMPLANT
SPONGE GAUZE 4X4 12PLY STER LF (GAUZE/BANDAGES/DRESSINGS) ×3 IMPLANT
SPONGE LAP 4X18 X RAY DECT (DISPOSABLE) ×3 IMPLANT
STAPLER VISISTAT 35W (STAPLE) ×3 IMPLANT
STRIP CLOSURE SKIN 1/2X4 (GAUZE/BANDAGES/DRESSINGS) ×3 IMPLANT
SUT MNCRL AB 3-0 PS2 18 (SUTURE) IMPLANT
SUT MNCRL AB 4-0 PS2 18 (SUTURE) IMPLANT
SUT MON AB 4-0 PC3 18 (SUTURE) ×3 IMPLANT
SUT MON AB 5-0 PS2 18 (SUTURE) ×3 IMPLANT
SUT PROLENE 2 0 SH DA (SUTURE) ×3 IMPLANT
SUT SILK 2 0 SH (SUTURE) ×3 IMPLANT
SUT SILK 2 0 TIES 17X18 (SUTURE)
SUT SILK 2-0 18XBRD TIE BLK (SUTURE) IMPLANT
SUT VIC AB 2-0 SH 27 (SUTURE) ×1
SUT VIC AB 2-0 SH 27XBRD (SUTURE) ×2 IMPLANT
SUT VIC AB 3-0 SH 27 (SUTURE) ×2
SUT VIC AB 3-0 SH 27X BRD (SUTURE) ×4 IMPLANT
SUT VIC AB 5-0 PS2 18 (SUTURE) IMPLANT
SUT VICRYL AB 3 0 TIES (SUTURE) IMPLANT
SYR 5ML LUER SLIP (SYRINGE) ×3 IMPLANT
SYR CONTROL 10ML LL (SYRINGE) ×3 IMPLANT
TOWEL OR 17X24 6PK STRL BLUE (TOWEL DISPOSABLE) ×3 IMPLANT
TOWEL OR NON WOVEN STRL DISP B (DISPOSABLE) ×3 IMPLANT
TUBE CONNECTING 20X1/4 (TUBING) IMPLANT
YANKAUER SUCT BULB TIP NO VENT (SUCTIONS) ×3 IMPLANT

## 2015-10-12 NOTE — H&P (View-Only) (Signed)
39 yof who noted a right breast mass for about two months. this has no really changed. no nipple discharge. mm shows density C. there is a mass with obscured margin in the right breast at 1 oclock. the left breast is normal US shows a 1.2 cm irregular mass in the right breast. [place] of axilla is normal.  her path is IDC, grade II.  Other Problems Patsey Berthold, CMA; 08/17/2015 7:47 AM) Anxiety Disorder Breast Cancer Depression Lump In Breast  Past Surgical History Patsey Berthold, [place]; 08/17/2015 7:47 AM) Breast Biopsy Right. Oral Surgery  Diagnostic Studies History Patsey Berthold, [place]; 08/17/2015 7:47 AM) Mammogram within last year  Medication History Patsey Berthold, [place]; 08/17/2015 7:47 AM) No Current Medications Medications Reconciled  Social History Patsey Berthold, CMA; 08/17/2015 7:47 AM) Alcohol use Moderate alcohol use. No drug use Tobacco use Former smoker.  Family History Patsey Berthold, [place]; 08/17/2015 7:47 AM) Arthritis Mother. Cerebrovascular Accident Mother. Heart Disease Father. Heart disease in female family member before age 40 Hypertension Father.  Pregnancy / Birth History Patsey Berthold, [place]; 08/17/2015 7:47 AM) Age at menarche 23 years. Contraceptive History Intrauterine device. Gravida 1 Irregular periods Maternal age 28-25 Para 1  Review of Systems Patsey Berthold CMA; 08/17/2015 7:47 AM) General Not Present- Appetite Loss, Chills, Fatigue, Fever, Night Sweats, Weight Gain and Weight Loss. Skin Not Present- Change in Wart/Mole, Dryness, Hives, Jaundice, New Lesions, Non-Healing Wounds, Rash and Ulcer. HEENT Present- Wears glasses/contact lenses. Not Present- Earache, Hearing Loss, Hoarseness, Nose Bleed, Oral Ulcers, Ringing in the Ears, Seasonal Allergies, Sinus Pain, Sore Throat, Visual Disturbances and Yellow Eyes. Respiratory Present- Snoring. Not Present- Bloody sputum, Chronic Cough, Difficulty Breathing and Wheezing. Breast Present-  Breast Mass. Not Present- Breast Pain, Nipple Discharge and Skin Changes. Cardiovascular Present- Swelling of Extremities. Not Present- Chest Pain, Difficulty Breathing Lying Down, Leg Cramps, Palpitations, Rapid Heart Rate and Shortness of Breath. Gastrointestinal Not Present- Abdominal Pain, Bloating, Bloody Stool, Change in Bowel Habits, Chronic diarrhea, Constipation, Difficulty Swallowing, Excessive gas, Gets full quickly at meals, Hemorrhoids, Indigestion, Nausea, Rectal Pain and Vomiting. Female Genitourinary Not Present- Frequency, Nocturia, Painful Urination, Pelvic Pain and Urgency. Musculoskeletal Not Present- Back Pain, Joint Pain, Joint Stiffness, Muscle Pain, Muscle Weakness and Swelling of Extremities. Neurological Not Present- Decreased Memory, Fainting, Headaches, Numbness, Seizures, Tingling, Tremor, Trouble walking and Weakness. Psychiatric Present- Anxiety, Bipolar and Depression. Not Present- Change in Sleep Pattern, Fearful and Frequent crying. Endocrine Not Present- Cold Intolerance, Excessive Hunger, Hair Changes, Heat Intolerance, Hot flashes and New Diabetes. Hematology Not Present- Easy Bruising, Excessive bleeding, Gland problems, HIV and Persistent Infections.   Physical Exam Rolm Bookbinder MD; 08/17/2015 3:44 PM) General Mental Status-Alert. Orientation-Oriented X3. Chest and Lung Exam Chest and lung exam reveals -on auscultation, normal breath sounds, no adventitious sounds and normal vocal resonance. Breast Nipples-No Discharge. Cardiovascular Cardiovascular examination reveals -normal heart sounds, regular rate and rhythm with no murmurs. Lymphatic Head & Neck General Head & Neck Lymphatics: Bilateral - Description - Normal. Axillary General Axillary Region: Bilateral - Description - Normal. Note: no [place] adenopathy   Assessment & Plan Rolm Bookbinder MD; 08/17/2015 3:57 PM) CANCER OF UPPER-OUTER QUADRANT OF FEMALE BREAST (C50.419) Story:  Right breast seed guided lumpectomy, right axillary sn biopsy, genetics pending, prognostic panel pending. We discussed the staging and pathophysiology of breast cancer. We discussed all of the different options for treatment for breast cancer including surgery, chemotherapy, radiation therapy, Herceptin, and antiestrogen therapy. We discussed a sentinel lymph node biopsy as she does  not appear to having lymph node involvement right now. We discussed the performance of that with injection of radioactive tracer. We discussed that there is a chance of having a positive node with a sentinel lymph node biopsy and we will await the permanent pathology to make any other first further decisions in terms of her treatment. We discussed up to a 5% risk lifetime of chronic shoulder pain as well as lymphedema associated with a sentinel lymph node biopsy. We discussed the options for treatment of the breast cancer which included lumpectomy versus a mastectomy. We discussed the performance of the lumpectomy with radioactive seed placement. We discussed a 5% chance of a positive margin requiring reexcision in the operating room. We also discussed that she will likely need radiation therapy (this is usually 5-7 weeks) if she undergoes lumpectomy. We discussed the mastectomy (removal of whole breast) and the postoperative care for that as well. Mastectomy can be followed by reconstruction. This is a more extensive surgery and requires more recovery. The decision for lumpectomy vs mastectomy has no impact on decision for chemotherapy. We discussed that there is no difference in her survival whether she undergoes lumpectomy with radiation therapy or antiestrogen therapy versus a mastectomy. There is also no real difference between her recurrence in the breast.

## 2015-10-12 NOTE — Anesthesia Procedure Notes (Signed)
Procedure Name: LMA Insertion Date/Time: 10/12/2015 9:09 AM Performed by: Jessica Petty D Pre-anesthesia Checklist: Patient identified, Emergency Drugs available, Suction available and Patient being monitored Patient Re-evaluated:Patient Re-evaluated prior to inductionOxygen Delivery Method: Circle System Utilized Preoxygenation: Pre-oxygenation with 100% oxygen Intubation Type: IV induction Ventilation: Mask ventilation without difficulty LMA: LMA inserted LMA Size: 4.0 Number of attempts: 1 Airway Equipment and Method: Bite block Placement Confirmation: positive ETCO2 Tube secured with: Tape Dental Injury: Teeth and Oropharynx as per pre-operative assessment

## 2015-10-12 NOTE — Discharge Instructions (Signed)
PORT-A-CATH: POST OP INSTRUCTIONS  Always review your discharge instruction sheet given to you by the facility where your surgery was performed.   1. A prescription for pain medication may be given to you upon discharge. Take your pain medication as prescribed, if needed. If narcotic pain medicine is not needed, then you make take acetaminophen (Tylenol) or ibuprofen (Advil) as needed.  2. Take your usually prescribed medications unless otherwise directed. 3. If you need a refill on your pain medication, please contact our office. All narcotic pain medicine now requires a paper prescription.  Phoned in and fax refills are no longer allowed by law.  Prescriptions will not be filled after 5 pm or on weekends.  4. You should follow a light diet for the remainder of the day after your procedure. 5. Most patients will experience some mild swelling and/or bruising in the area of the incision. It may take several days to resolve. 6. It is common to experience some constipation if taking pain medication after surgery. Increasing fluid intake and taking a stool softener (such as Colace) will usually help or prevent this problem from occurring. A mild laxative (Milk of Magnesia or Miralax) should be taken according to package directions if there are no bowel movements after 48 hours.  7. Unless discharge instructions indicate otherwise, you may remove your bandages 48 hours after surgery, and you may shower at that time. You may have steri-strips (small white skin tapes) in place directly over the incision.  These strips should be left on the skin for 7-10 days.  If your surgeon used Dermabond (skin glue) on the incision, you may shower in 24 hours.  The glue will flake off over the next 2-3 weeks.  8. If your port is left accessed at the end of surgery (needle left in port), the dressing cannot get wet and should only by changed by a healthcare professional. When the port is no longer accessed (when the  needle has been removed), follow step 7.   9. ACTIVITIES:  Limit activity involving your arms for the next 72 hours. Do no strenuous exercise or activity for 1 week. You may drive when you are no longer taking prescription pain medication, you can comfortably wear a seatbelt, and you can maneuver your car. 10.You may need to see your doctor in the office for a follow-up appointment.  Please       check with your doctor.  11.When you receive a new Port-a-Cath, you will get a product guide and        ID card.  Please keep them in case you need them.  WHEN TO CALL YOUR DOCTOR [phone]): 1. Fever over 101.0 2. Chills 3. Continued bleeding from incision 4. Increased redness and tenderness at the site 5. Shortness of breath, difficulty breathing   The clinic staff is available to answer your questions during regular business hours. Please dont hesitate to call and ask to speak to one of the nurses or medical assistants for clinical concerns. If you have a medical emergency, go to the nearest emergency room or call 911.  A surgeon from [facility] is always on call at the hospital.     For further information, please visit [email or web address]   [employer] Phone Number [phone]  Breast surgery: POST OP INSTRUCTIONS  Always review your discharge instruction sheet given to you by the facility where your surgery was performed.  IF YOU HAVE DISABILITY OR FAMILY LEAVE FORMS, YOU  MUST BRING THEM TO THE OFFICE FOR PROCESSING.  DO NOT GIVE THEM TO YOUR DOCTOR.  1. A prescription for pain medication may be given to you upon discharge.  Take your pain medication as prescribed, if needed.  If narcotic pain medicine is not needed, then you may take acetaminophen (Tylenol), naprosyn (Alleve) or ibuprofen (Advil) as needed. 2. Take your usually prescribed medications unless otherwise directed 3. If you need a refill on your pain medication, please  contact your pharmacy.  They will contact our office to request authorization.  Prescriptions will not be filled after 5pm or on week-ends. 4. You should eat very light the first 24 hours after surgery, such as soup, crackers, pudding, etc.  Resume your normal diet the day after surgery. 5. Most patients will experience some swelling and bruising in the breast.  Ice packs and a good support bra will help.  Wear the breast binder provided or a sports bra for 72 hours day and night.  After that wear a sports bra during the day until you return to the office. Swelling and bruising can take several days to resolve.  6. It is common to experience some constipation if taking pain medication after surgery.  Increasing fluid intake and taking a stool softener will usually help or prevent this problem from occurring.  A mild laxative (Milk of Magnesia or Miralax) should be taken according to package directions if there are no bowel movements after 48 hours. 7. Unless discharge instructions indicate otherwise, you may remove your bandages 48 hours after surgery and you may shower at that time.  You may have steri-strips (small skin tapes) in place directly over the incision.  These strips should be left on the skin for 7-10 days and will come off on their own.  If your surgeon used skin glue on the incision, you may shower in 24 hours.  The glue will flake off over the next 2-3 weeks.  Any sutures or staples will be removed at the office during your follow-up visit. 8. ACTIVITIES:  You may resume regular daily activities (gradually increasing) beginning the next day.  Wearing a good support bra or sports bra minimizes pain and swelling.  You may have sexual intercourse when it is comfortable. a. You may drive when you no longer are taking prescription pain medication, you can comfortably wear a seatbelt, and you can safely maneuver your car and apply brakes. b. RETURN TO WORK:   ______________________________________________________________________________________ 9. You should see your doctor in the office for a follow-up appointment approximately two weeks after your surgery.  Your doctors nurse will typically make your follow-up appointment when she calls you with your pathology report.  Expect your pathology report 3-4 business days after your surgery.  You may call to check if you do not hear from [place] after three days. 10. OTHER INSTRUCTIONS: _______________________________________________________________________________________________ _____________________________________________________________________________________________________________________________________ _____________________________________________________________________________________________________________________________________ _____________________________________________________________________________________________________________________________________  WHEN TO CALL DR WAKEFIELD: 1. Fever over 101.0 2. Nausea and/or vomiting. 3. Extreme swelling or bruising. 4. Continued bleeding from incision. 5. Increased pain, redness, or drainage from the incision.  The clinic staff is available to answer your questions during regular business hours.  Please dont hesitate to call and ask to speak to one of the nurses for clinical concerns.  If you have a medical emergency, go to the nearest emergency room or call 911.  A surgeon from [facility] is always on call at the hospital.  For further questions, please visit [email or web address]  Post Anesthesia Home Care Instructions  Activity: Get plenty of rest for the remainder of the day. A responsible adult should stay with you for 24 hours following the procedure.  For the next 24 hours, DO NOT: -Drive a car -Paediatric nurse -Drink alcoholic beverages -Take any medication unless instructed by your physician -Make any  legal decisions or sign important papers.  Meals: Start with liquid foods such as gelatin or soup. Progress to regular foods as tolerated. Avoid greasy, spicy, heavy foods. If nausea and/or vomiting occur, drink only clear liquids until the nausea and/or vomiting subsides. Call your physician if vomiting continues.  Special Instructions/Symptoms: Your throat may feel dry or sore from the anesthesia or the breathing tube placed in your throat during surgery. If this causes discomfort, gargle with warm salt water. The discomfort should disappear within 24 hours.  If you had a scopolamine patch placed behind your ear for the management of post- operative nausea and/or vomiting:  1. The medication in the patch is effective for 72 hours, after which it should be removed.  Wrap patch in a tissue and discard in the trash. Wash hands thoroughly with soap and water. 2. You may remove the patch earlier than 72 hours if you experience unpleasant side effects which may include dry mouth, dizziness or visual disturbances. 3. Avoid touching the patch. Wash your hands with soap and water after contact with the patch.

## 2015-10-12 NOTE — Op Note (Signed)
Preoperative diagnosis: breast cancer s/p lump/sn with close margin, high oncotype needs venous access for chemotherapy Postoperative diagnosis: same as above Procedure: right ij powerport insertion, re-excision right breast inferior margin Surgeon: Dr Serita Grammes EBL: minimal Anes: general  Specimens right breast inferior margin marked with paint Complications none Drains none Sponge count correct Dispo to pacu stable  Indications: This is a 39 yof who underwent lumpectomy/sn recently. 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 The posterior margin is the pectoralis muscle.  We discussed excision of this inferior margin.  She also had a high oncotype and will receive chemotherapy.   We discussed port placement prior to her beginning chemotherapy after meeting with medical oncology.  Procedure: After informed consent was obtained the patient was taken to the operating room. She was given antibiotics. Sequential compression devices were on her legs. She was then placed under general anesthesia with an LMA. She was then prepped and draped in the standard sterile surgical fashion. Surgical timeout was then performed.  I used the ultrasound to identify the right internal jugular vein. I then accessed the vein using the ultrasound. This aspirated blood. I then placed the wire.This was confirmed by fluoroscopy to be in the correct position. I then made a pocket below her clavicle on the right side. I tunneled the line between the 2 sites. I then dilated the tract and placed the dilator assembly with the sheath. This was done under fluoroscopy. I then removed the sheath and dilator. The wire was also removed. The line was then pulled back to be in the distal cava. I hooked this up to the port. I sutured this into place with 2-0 Prolene in 2 places. This aspirated blood and flushed easily. I placed heparin in the port. This was all confirmed with a final fluoroscopy. I then  closed this with 2-0 Vicryl and 4-0 Monocryl. Dermabond was placed on both the sites.  I then reentered the breast incision. I removed all my old sutures and excised the inferior margin. This was marked with paint.  I obtained hemostasis and placed a clip at this margin.  I then closed the cavity with 2-0 vicryl. The skin was closed with 3-0 vicryl and 4-0 monocryl.  Glue and steristrip were applied.  A breast binder was placed. She tolerated this well, was extubated and transferred to recovery stable.

## 2015-10-12 NOTE — Transfer of Care (Signed)
Immediate Anesthesia Transfer of Care Note  Patient: Jessica Petty  Procedure(s) Performed: Procedure(s): RE-EXCISION MARGIN RIGHT BREAST (Right) INSERTION PORT-A-CATH WITH ULTRASOUND (N/A)  Patient Location: PACU  Anesthesia Type:General  Level of Consciousness: awake, alert , oriented and patient cooperative  Airway & Oxygen Therapy: Patient Spontanous Breathing and Patient connected to face mask oxygen  Post-op Assessment: Report given to RN and Post -op Vital signs reviewed and stable  Post vital signs: Reviewed and stable  Last Vitals:  Filed Vitals:   10/12/15 0816  BP: 135/95  Pulse: 80  Temp: 36.6 C  Resp: 20    Complications: No apparent anesthesia complications

## 2015-10-12 NOTE — Anesthesia Postprocedure Evaluation (Signed)
  Anesthesia Post-op Note  Patient: Jessica Petty  Procedure(s) Performed: Procedure(s): RE-EXCISION MARGIN RIGHT BREAST (Right) INSERTION PORT-A-CATH WITH ULTRASOUND (N/A)  Patient Location: PACU  Anesthesia Type: General   Level of Consciousness: awake, alert  and oriented  Airway and Oxygen Therapy: Patient Spontanous Breathing  Post-op Pain: mild  Post-op Assessment: Post-op Vital signs reviewed  Post-op Vital Signs: Reviewed  Last Vitals:  Filed Vitals:   10/12/15 1045  BP: 130/83  Pulse: 86  Temp:   Resp: 14    Complications: No apparent anesthesia complications

## 2015-10-12 NOTE — Interval H&P Note (Signed)
History and Physical Interval Note:  10/12/2015 9:05 AM  Jessica Petty  has presented today for surgery, with the diagnosis of BREAST CANCER  The various methods of treatment have been discussed with the patient and family. After consideration of risks, benefits and other options for treatment, the patient has consented to  Procedure(s): RE-EXCISION MARGIN RIGHT BREAST (Right) INSERTION PORT-A-CATH WITH ULTRASOUND (N/A) as a surgical intervention .  The patient's history has been reviewed, patient examined, no change in status, stable for surgery.  I have reviewed the patient's chart and labs.  Questions were answered to the patient's satisfaction.     [place]aurus [place]

## 2015-10-13 ENCOUNTER — Encounter (HOSPITAL_BASED_OUTPATIENT_CLINIC_OR_DEPARTMENT_OTHER): Payer: Self-pay | Admitting: General Surgery

## 2015-10-14 ENCOUNTER — Other Ambulatory Visit (HOSPITAL_COMMUNITY): Payer: BLUE CROSS/BLUE SHIELD

## 2015-10-21 ENCOUNTER — Encounter (HOSPITAL_BASED_OUTPATIENT_CLINIC_OR_DEPARTMENT_OTHER): Payer: Self-pay | Admitting: General Surgery

## 2015-10-24 ENCOUNTER — Encounter: Payer: Self-pay | Admitting: Radiation Oncology

## 2015-10-24 NOTE — Progress Notes (Signed)
Chart note: Show reexcision by Dr. Donne Hazel on 10/12/2015.  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  In particular, the inferior margin was reexcised.  She'll proceed with chemotherapy which can then be followed by radiation therapy next year.

## 2015-11-04 ENCOUNTER — Encounter (HOSPITAL_COMMUNITY): Payer: Self-pay

## 2015-11-09 ENCOUNTER — Encounter: Payer: Self-pay | Admitting: Hematology and Oncology

## 2015-11-09 ENCOUNTER — Telehealth: Payer: Self-pay | Admitting: Hematology and Oncology

## 2015-11-09 ENCOUNTER — Encounter: Payer: Self-pay | Admitting: *Deleted

## 2015-11-09 ENCOUNTER — Ambulatory Visit (HOSPITAL_BASED_OUTPATIENT_CLINIC_OR_DEPARTMENT_OTHER): Payer: BLUE CROSS/BLUE SHIELD

## 2015-11-09 ENCOUNTER — Ambulatory Visit (HOSPITAL_BASED_OUTPATIENT_CLINIC_OR_DEPARTMENT_OTHER): Payer: BLUE CROSS/BLUE SHIELD | Admitting: Hematology and Oncology

## 2015-11-09 ENCOUNTER — Other Ambulatory Visit (HOSPITAL_BASED_OUTPATIENT_CLINIC_OR_DEPARTMENT_OTHER): Payer: BLUE CROSS/BLUE SHIELD

## 2015-11-09 VITALS — BP 135/81 | HR 99 | Temp 98.0°F | Resp 18 | Ht 61.0 in | Wt 187.0 lb

## 2015-11-09 DIAGNOSIS — C50211 Malignant neoplasm of upper-inner quadrant of right female breast: Secondary | ICD-10-CM

## 2015-11-09 DIAGNOSIS — Z5111 Encounter for antineoplastic chemotherapy: Secondary | ICD-10-CM | POA: Diagnosis not present

## 2015-11-09 DIAGNOSIS — Z5189 Encounter for other specified aftercare: Secondary | ICD-10-CM | POA: Diagnosis not present

## 2015-11-09 LAB — COMPREHENSIVE METABOLIC PANEL (CC13)
ALT: 23 U/L (ref 0–55)
ANION GAP: 6 meq/L (ref 3–11)
AST: 19 U/L (ref 5–34)
Albumin: 3.9 g/dL (ref 3.5–5.0)
Alkaline Phosphatase: 67 U/L (ref 40–150)
BILIRUBIN TOTAL: 0.52 mg/dL (ref 0.20–1.20)
BUN: 11.7 mg/dL (ref 7.0–26.0)
CHLORIDE: 105 meq/L (ref 98–109)
CO2: 27 meq/L (ref 22–29)
Calcium: 9.6 mg/dL (ref 8.4–10.4)
Creatinine: 0.8 mg/dL (ref 0.6–1.1)
EGFR: 89 mL/min/{1.73_m2} — AB (ref >90)
GLUCOSE: 91 mg/dL (ref 70–140)
POTASSIUM: 4 meq/L (ref 3.5–5.1)
SODIUM: 139 meq/L (ref 136–145)
Total Protein: 6.9 g/dL (ref 6.4–8.3)

## 2015-11-09 LAB — CBC WITH DIFFERENTIAL/PLATELET
BASO%: 0.7 % (ref 0.0–2.0)
Basophils Absolute: 0.1 10*3/uL (ref 0.0–0.1)
EOS ABS: 0.1 10*3/uL (ref 0.0–0.5)
EOS%: 1.3 % (ref 0.0–7.0)
HCT: 40.9 % (ref 34.8–46.6)
HGB: 13.7 g/dL (ref 11.6–15.9)
LYMPH%: 19.5 % (ref 14.0–49.7)
MCH: 27.5 pg (ref 25.1–34.0)
MCHC: 33.5 g/dL (ref 31.5–36.0)
MCV: 82.3 fL (ref 79.5–101.0)
MONO#: 0.4 10*3/uL (ref 0.1–0.9)
MONO%: 5.6 % (ref 0.0–14.0)
NEUT#: 5.4 10*3/uL (ref 1.5–6.5)
NEUT%: 72.9 % (ref 38.4–76.8)
PLATELETS: 241 10*3/uL (ref 145–400)
RBC: 4.97 10*6/uL (ref 3.70–5.45)
RDW: 13.2 % (ref 11.2–14.5)
WBC: 7.4 10*3/uL (ref 3.9–10.3)
lymph#: 1.4 10*3/uL (ref 0.9–3.3)

## 2015-11-09 MED ORDER — DOXORUBICIN HCL CHEMO IV INJECTION 2 MG/ML
60.0000 mg/m2 | Freq: Once | INTRAVENOUS | Status: AC
  Administered 2015-11-09: 114 mg via INTRAVENOUS
  Filled 2015-11-09: qty 57

## 2015-11-09 MED ORDER — SODIUM CHLORIDE 0.9 % IV SOLN
Freq: Once | INTRAVENOUS | Status: AC
  Administered 2015-11-09: 12:00:00 via INTRAVENOUS
  Filled 2015-11-09: qty 5

## 2015-11-09 MED ORDER — SODIUM CHLORIDE 0.9 % IV SOLN
600.0000 mg/m2 | Freq: Once | INTRAVENOUS | Status: AC
  Administered 2015-11-09: 1140 mg via INTRAVENOUS
  Filled 2015-11-09: qty 57

## 2015-11-09 MED ORDER — PALONOSETRON HCL INJECTION 0.25 MG/5ML
0.2500 mg | Freq: Once | INTRAVENOUS | Status: AC
Start: 2015-11-09 — End: 2015-11-09
  Administered 2015-11-09: 0.25 mg via INTRAVENOUS

## 2015-11-09 MED ORDER — SODIUM CHLORIDE 0.9 % IJ SOLN
10.0000 mL | INTRAMUSCULAR | Status: DC | PRN
  Administered 2015-11-09: 10 mL
  Filled 2015-11-09: qty 10

## 2015-11-09 MED ORDER — SODIUM CHLORIDE 0.9 % IV SOLN
Freq: Once | INTRAVENOUS | Status: AC
  Administered 2015-11-09: 12:00:00 via INTRAVENOUS

## 2015-11-09 MED ORDER — PEGFILGRASTIM 6 MG/0.6ML ~~LOC~~ PSKT
6.0000 mg | PREFILLED_SYRINGE | Freq: Once | SUBCUTANEOUS | Status: AC
  Administered 2015-11-09: 6 mg via SUBCUTANEOUS
  Filled 2015-11-09: qty 0.6

## 2015-11-09 MED ORDER — PALONOSETRON HCL INJECTION 0.25 MG/5ML
INTRAVENOUS | Status: AC
  Filled 2015-11-09: qty 5

## 2015-11-09 MED ORDER — HEPARIN SOD (PORK) LOCK FLUSH 100 UNIT/ML IV SOLN
500.0000 [IU] | Freq: Once | INTRAVENOUS | Status: AC | PRN
  Administered 2015-11-09: 500 [IU]
  Filled 2015-11-09: qty 5

## 2015-11-09 NOTE — Telephone Encounter (Signed)
Gave and printed appt sched and avs for DEC  °

## 2015-11-09 NOTE — Patient Instructions (Signed)
Cyclophosphamide injection What is this medicine? CYCLOPHOSPHAMIDE (sye kloe FOSS fa mide) is a chemotherapy drug. It slows the growth of cancer cells. This medicine is used to treat many types of cancer like lymphoma, myeloma, leukemia, breast cancer, and ovarian cancer, to name a few. This medicine may be used for other purposes; ask your health care provider or pharmacist if you have questions. What should I tell my health care provider before I take this medicine? They need to know if you have any of these conditions: -blood disorders -history of other chemotherapy -infection -kidney disease -liver disease -recent or ongoing radiation therapy -tumors in the bone marrow -an unusual or allergic reaction to cyclophosphamide, other chemotherapy, other medicines, foods, dyes, or preservatives -pregnant or trying to get pregnant -breast-feeding How should I use this medicine? This drug is usually given as an injection into a vein or muscle or by infusion into a vein. It is administered in a hospital or clinic by a specially trained health care professional. Talk to your pediatrician regarding the use of this medicine in children. Special care may be needed. Overdosage: If you think you have taken too much of this medicine contact a poison control center or emergency room at once. NOTE: This medicine is only for you. Do not share this medicine with others. What if I miss a dose? It is important not to miss your dose. Call your doctor or health care professional if you are unable to keep an appointment. What may interact with this medicine? This medicine may interact with the following medications: -amiodarone -amphotericin B -azathioprine -certain antiviral medicines for HIV or AIDS such as protease inhibitors (e.g., indinavir, ritonavir) and zidovudine -certain blood pressure medications such as benazepril, captopril, enalapril, fosinopril, lisinopril, moexipril, monopril, perindopril,  quinapril, ramipril, trandolapril -certain cancer medications such as anthracyclines (e.g., daunorubicin, doxorubicin), busulfan, cytarabine, paclitaxel, pentostatin, tamoxifen, trastuzumab -certain diuretics such as chlorothiazide, chlorthalidone, hydrochlorothiazide, indapamide, metolazone -certain medicines that treat or prevent blood clots like warfarin -certain muscle relaxants such as succinylcholine -cyclosporine -etanercept -indomethacin -medicines to increase blood counts like filgrastim, pegfilgrastim, sargramostim -medicines used as general anesthesia -metronidazole -natalizumab This list may not describe all possible interactions. Give your health care provider a list of all the medicines, herbs, non-prescription drugs, or dietary supplements you use. Also tell them if you smoke, drink alcohol, or use illegal drugs. Some items may interact with your medicine. What should I watch for while using this medicine? Visit your doctor for checks on your progress. This drug may make you feel generally unwell. This is not uncommon, as chemotherapy can affect healthy cells as well as cancer cells. Report any side effects. Continue your course of treatment even though you feel ill unless your doctor tells you to stop. Drink water or other fluids as directed. Urinate often, even at night. In some cases, you may be given additional medicines to help with side effects. Follow all directions for their use. Call your doctor or health care professional for advice if you get a fever, chills or sore throat, or other symptoms of a cold or flu. Do not treat yourself. This drug decreases your body's ability to fight infections. Try to avoid being around people who are sick. This medicine may increase your risk to bruise or bleed. Call your doctor or health care professional if you notice any unusual bleeding. Be careful brushing and flossing your teeth or using a toothpick because you may get an infection or  bleed more easily. If you have  medicine may increase your risk to bruise or bleed. Call your doctor or health care professional if you notice any unusual bleeding.  Be careful brushing and flossing your teeth or using a toothpick because you may get an infection or  bleed more easily. If you have any dental work done, tell your dentist you are receiving this medicine.  You may get drowsy or dizzy. Do not drive, use machinery, or do anything that needs mental alertness until you know how this medicine affects you.  Do not become pregnant while taking this medicine or for 1 year after stopping it. Women should inform their doctor if they wish to become pregnant or think they might be pregnant. Men should not father a child while taking this medicine and for 4 months after stopping it. There is a potential for serious side effects to an unborn child. Talk to your health care professional or pharmacist for more information. Do not breast-feed an infant while taking this medicine.  This medicine may interfere with the ability to have a child. This medicine has caused ovarian failure in some women. This medicine has caused reduced sperm counts in some men. You should talk with your doctor or health care professional if you are concerned about your fertility.  If you are going to have surgery, tell your doctor or health care professional that you have taken this medicine.  What side effects may I notice from receiving this medicine?  Side effects that you should report to your doctor or health care professional as soon as possible:  -allergic reactions like skin rash, itching or hives, swelling of the face, lips, or tongue  -low blood counts - this medicine may decrease the number of white blood cells, red blood cells and platelets. You may be at increased risk for infections and bleeding.  -signs of infection - fever or chills, cough, sore throat, pain or difficulty passing urine  -signs of decreased platelets or bleeding - bruising, pinpoint red spots on the skin, black, tarry stools, blood in the urine  -signs of decreased red blood cells - unusually weak or tired, fainting spells, lightheadedness  -breathing problems  -dark urine  -dizziness  -palpitations  -swelling of the  ankles, feet, hands  -trouble passing urine or change in the amount of urine  -weight gain  -yellowing of the eyes or skin  Side effects that usually do not require medical attention (report to your doctor or health care professional if they continue or are bothersome):  -changes in nail or skin color  -hair loss  -missed menstrual periods  -mouth sores  -nausea, vomiting  This list may not describe all possible side effects. Call your doctor for medical advice about side effects. You may report side effects to FDA at 1-800-FDA-1088.  Where should I keep my medicine?  This drug is given in a hospital or clinic and will not be stored at home.  NOTE: This sheet is a summary. It may not cover all possible information. If you have questions about this medicine, talk to your doctor, pharmacist, or health care provider.      2016, Elsevier/Gold Standard. (2012-10-10 16:22:58)  Doxorubicin injection  What is this medicine?  DOXORUBICIN (dox oh ROO bi sin) is a chemotherapy drug. It is used to treat many kinds of cancer like Hodgkin's disease, leukemia, non-Hodgkin's lymphoma, neuroblastoma, sarcoma, and Wilms' tumor. It is also used to treat bladder cancer, breast cancer, lung cancer, ovarian cancer, stomach cancer, and thyroid cancer.    This medicine may be used for other purposes; ask your health care provider or pharmacist if you have questions.  What should I tell my health care provider before I take this medicine?  They need to know if you have any of these conditions:  -blood disorders  -heart disease, recent heart attack  -infection (especially a virus infection such as chickenpox, cold sores, or herpes)  -irregular heartbeat  -liver disease  -recent or ongoing radiation therapy  -an unusual or allergic reaction to doxorubicin, other chemotherapy agents, other medicines, foods, dyes, or preservatives  -pregnant or trying to get pregnant  -breast-feeding  How should I use this medicine?  This drug is given as an  infusion into a vein. It is administered in a hospital or clinic by a specially trained health care professional. If you have pain, swelling, burning or any unusual feeling around the site of your injection, tell your health care professional right away.  Talk to your pediatrician regarding the use of this medicine in children. Special care may be needed.  Overdosage: If you think you have taken too much of this medicine contact a poison control center or emergency room at once.  NOTE: This medicine is only for you. Do not share this medicine with others.  What if I miss a dose?  It is important not to miss your dose. Call your doctor or health care professional if you are unable to keep an appointment.  What may interact with this medicine?  Do not take this medicine with any of the following medications:  -cisapride  -droperidol  -halofantrine  -pimozide  -zidovudine  This medicine may also interact with the following medications:  -chloroquine  -chlorpromazine  -clarithromycin  -cyclophosphamide  -cyclosporine  -erythromycin  -medicines for depression, anxiety, or psychotic disturbances  -medicines for irregular heart beat like amiodarone, bepridil, dofetilide, encainide, flecainide, propafenone, quinidine  -medicines for seizures like ethotoin, fosphenytoin, phenytoin  -medicines for nausea, vomiting like dolasetron, ondansetron, palonosetron  -medicines to increase blood counts like filgrastim, pegfilgrastim, sargramostim  -methadone  -methotrexate  -pentamidine  -progesterone  -vaccines  -verapamil  Talk to your doctor or health care professional before taking any of these medicines:  -acetaminophen  -aspirin  -ibuprofen  -ketoprofen  -naproxen  This list may not describe all possible interactions. Give your health care provider a list of all the medicines, herbs, non-prescription drugs, or dietary supplements you use. Also tell them if you smoke, drink alcohol, or use illegal drugs. Some items may interact  with your medicine.  What should I watch for while using this medicine?  Your condition will be monitored carefully while you are receiving this medicine. You will need important blood work done while you are taking this medicine.  This drug may make you feel generally unwell. This is not uncommon, as chemotherapy can affect healthy cells as well as cancer cells. Report any side effects. Continue your course of treatment even though you feel ill unless your doctor tells you to stop.  Your urine may turn red for a few days after your dose. This is not blood. If your urine is dark or brown, call your doctor.  In some cases, you may be given additional medicines to help with side effects. Follow all directions for their use.  Call your doctor or health care professional for advice if you get a fever, chills or sore throat, or other symptoms of a cold or flu. Do not treat yourself. This drug decreases your body's ability   to fight infections. Try to avoid being around people who are sick.  This medicine may increase your risk to bruise or bleed. Call your doctor or health care professional if you notice any unusual bleeding.  Be careful brushing and flossing your teeth or using a toothpick because you may get an infection or bleed more easily. If you have any dental work done, tell your dentist you are receiving this medicine.  Avoid taking products that contain aspirin, acetaminophen, ibuprofen, naproxen, or ketoprofen unless instructed by your doctor. These medicines may hide a fever.  Men and women of childbearing age should use effective birth control methods while using taking this medicine. Do not become pregnant while taking this medicine. There is a potential for serious side effects to an unborn child. Talk to your health care professional or pharmacist for more information. Do not breast-feed an infant while taking this medicine.  Do not let others touch your urine or other body fluids for 5 days after each  treatment with this medicine. Caregivers should wear latex gloves to avoid touching body fluids during this time.  There is a maximum amount of this medicine you should receive throughout your life. The amount depends on the medical condition being treated and your overall health. Your doctor will watch how much of this medicine you receive in your lifetime. Tell your doctor if you have taken this medicine before.  What side effects may I notice from receiving this medicine?  Side effects that you should report to your doctor or health care professional as soon as possible:  -allergic reactions like skin rash, itching or hives, swelling of the face, lips, or tongue  -low blood counts - this medicine may decrease the number of white blood cells, red blood cells and platelets. You may be at increased risk for infections and bleeding.  -signs of infection - fever or chills, cough, sore throat, pain or difficulty passing urine  -signs of decreased platelets or bleeding - bruising, pinpoint red spots on the skin, black, tarry stools, blood in the urine  -signs of decreased red blood cells - unusually weak or tired, fainting spells, lightheadedness  -breathing problems  -chest pain  -fast, irregular heartbeat  -mouth sores  -nausea, vomiting  -pain, swelling, redness at site where injected  -pain, tingling, numbness in the hands or feet  -swelling of ankles, feet, or hands  -unusual bleeding or bruising  Side effects that usually do not require medical attention (report to your doctor or health care professional if they continue or are bothersome):  -diarrhea  -facial flushing  -hair loss  -loss of appetite  -missed menstrual periods  -nail discoloration or damage  -red or watery eyes  -red colored urine  -stomach upset  This list may not describe all possible side effects. Call your doctor for medical advice about side effects. You may report side effects to FDA at 1-800-FDA-1088.  Where should I keep my medicine?  This  drug is given in a hospital or clinic and will not be stored at home.  NOTE: This sheet is a summary. It may not cover all possible information. If you have questions about this medicine, talk to your doctor, pharmacist, or health care provider.      2016, Elsevier/Gold Standard. (2013-03-24 09:54:34)

## 2015-11-09 NOTE — Progress Notes (Signed)
Patient Care Team: No Pcp Per Patient as PCP - General (General Practice) Brien Few, MD as Consulting Physician (Obstetrics and Gynecology) Rolm Bookbinder, MD as Consulting Physician (General Surgery) Nicholas Lose, MD as Consulting Physician (Hematology and Oncology) Arloa Koh, MD as Consulting Physician (Radiation Oncology) Rockwell Germany, RN as Registered Nurse Mauro Kaufmann, RN as Registered Nurse Sylvan Cheese, NP as Nurse Practitioner (Nurse Practitioner)  DIAGNOSIS: Breast cancer of upper-inner quadrant of right female breast [facility])   Staging form: Breast, AJCC 7th Edition     Clinical stage from 08/17/2015: Stage IA (T1c, N0, M0) - Unsigned       Staging comments: Staged at breast conference on 9.7.16    SUMMARY OF ONCOLOGIC HISTORY:   Breast cancer of upper-inner quadrant of right female breast (Jessica Petty)   08/10/2015 Mammogram Right breast mass 1.2 cm irregular spiculated   08/11/2015 Initial Diagnosis 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, grade 2-3   08/29/2015 Procedure  genetic testing normal (Genes tested includeATM, BARD1, BRCA1, BRCA2, BRIP1, CDH1, CHEK2, FANCC, MLH1, MSH2, MSH6, NBN, PALB2, PMS2, PTEN, RAD51C, RAD51D, TP53, and XRCC2)   09/14/2015 Surgery Right Lumpectomy: IDC Grade 2/3, 1.2 cm, LVI Present, 0/1 LN T1CN0 (Stage 1A) Oncotype DX 44, 30% ROR   09/20/2015 Procedure genetic testing revealed no pathogenic mutations   11/09/2015 -  Chemotherapy adjuvant chemotherapy: Adriamycin and Cytoxan dose dense 4 followed by Abraxane weekly 12    CHIEF COMPLIANT: cycle 1 dose dense Adriamycin and Cytoxan  INTERVAL HISTORY: Jessica Petty is a 39 year old with above-mentioned history of right breast cancer and underwent lumpectomy and is here today to receive cycle 1 of dose dense Adriamycin and Cytoxan. She is very anxious to get started with chemotherapy.  REVIEW OF SYSTEMS:   Constitutional: Denies fevers, chills or abnormal  weight loss Eyes: Denies blurriness of vision Ears, nose, mouth, throat, and face: Denies mucositis or sore throat Respiratory: Denies cough, dyspnea or wheezes Cardiovascular: Denies palpitation, chest discomfort or lower extremity swelling Gastrointestinal:  Denies nausea, heartburn or change in bowel habits Skin: Denies abnormal skin rashes Lymphatics: Denies new lymphadenopathy or easy bruising Neurological:Denies numbness, tingling or new weaknesses Behavioral/Psych: Mood is stable, no new changes , slightly anxious Breast:  denies any pain or lumps or nodules in either breasts All other systems were reviewed with the patient and are negative.  I have reviewed the past medical history, past surgical history, social history and family history with the patient and they are unchanged from previous note.  ALLERGIES:  has No Known Allergies.  MEDICATIONS:  Current Outpatient Prescriptions  Medication Sig Dispense Refill  . ADDERALL XR 20 MG 24 hr capsule Take 20 mg by mouth.  0  . ALPRAZolam (XANAX) 1 MG tablet Take 1 mg by mouth 3 (three) times daily as needed.  5  . ARIPiprazole (ABILIFY) 10 MG tablet Take 10 mg by mouth at bedtime.  11  . dexamethasone (DECADRON) 4 MG tablet Take 1 tablets by mouth once a day on the day after chemotherapy and then take 1 tablets two times a day for 2 days. Take with food. 30 tablet 1  . HYDROcodone-acetaminophen (NORCO) 10-325 MG tablet Take 1 tablet by mouth every 6 (six) hours as needed. 20 tablet 0  . lidocaine-prilocaine (EMLA) cream Apply to affected area once 30 g 3  . LORazepam (ATIVAN) 0.5 MG tablet Take 1 tablet (0.5 mg total) by mouth at bedtime. 30 tablet 0  . ondansetron (ZOFRAN)  8 MG tablet Take 1 tablet (8 mg total) by mouth 2 (two) times daily as needed. Start on the third day after chemotherapy. 30 tablet 1  . oxyCODONE-acetaminophen (PERCOCET) 10-325 MG tablet Take 1 tablet by mouth every 6 (six) hours as needed for pain. 20 tablet 0    . prochlorperazine (COMPAZINE) 10 MG tablet Take 1 tablet (10 mg total) by mouth every 6 (six) hours as needed (Nausea or vomiting). 30 tablet 1  . Vitamin D, Ergocalciferol, (DRISDOL) 50000 UNITS CAPS capsule TAKE 1 CAPSULE BY MOUTH 2 TIMES WEEKLY AS DIRECTED  12   No current facility-administered medications for this visit.   Facility-Administered Medications Ordered in Other Visits  Medication Dose Route Frequency Provider Last Rate Last Dose  . cyclophosphamide (CYTOXAN) 1,140 mg in sodium chloride 0.9 % 250 mL chemo infusion  600 mg/m2 (Treatment Plan Actual) Intravenous Once Nicholas Lose, MD 614 mL/hr at 11/09/15 1234 1,140 mg at 11/09/15 1234  . heparin lock flush 100 unit/mL  500 Units Intracatheter Once PRN Nicholas Lose, MD      . pegfilgrastim (NEULASTA ONPRO KIT) injection 6 mg  6 mg Subcutaneous Once Nicholas Lose, MD      . sodium chloride 0.9 % injection 10 mL  10 mL Intracatheter PRN Nicholas Lose, MD        PHYSICAL EXAMINATION: ECOG PERFORMANCE STATUS: 1 - Symptomatic but completely ambulatory  Filed Vitals:   11/09/15 0959  BP: 135/81  Pulse: 99  Temp: 98 F (36.7 C)  Resp: 18   Filed Weights   11/09/15 0959  Weight: 187 lb (84.823 kg)    GENERAL:alert, no distress and comfortable SKIN: skin color, texture, turgor are normal, no rashes or significant lesions EYES: normal, Conjunctiva are pink and non-injected, sclera clear OROPHARYNX:no exudate, no erythema and lips, buccal mucosa, and tongue normal  NECK: supple, thyroid normal size, non-tender, without nodularity LYMPH:  no palpable lymphadenopathy in the cervical, axillary or inguinal LUNGS: clear to auscultation and percussion with normal breathing effort HEART: regular rate & rhythm and no murmurs and no lower extremity edema ABDOMEN:abdomen soft, non-tender and normal bowel sounds Musculoskeletal:no cyanosis of digits and no clubbing  NEURO: alert & oriented x 3 with fluent speech, no focal motor/sensory  deficits  LABORATORY DATA:  I have reviewed the data as listed   Chemistry      Component Value Date/Time   NA 139 11/09/2015 0940   K 4.0 11/09/2015 0940   CO2 27 11/09/2015 0940   BUN 11.7 11/09/2015 0940   CREATININE 0.8 11/09/2015 0940      Component Value Date/Time   CALCIUM 9.6 11/09/2015 0940   ALKPHOS 67 11/09/2015 0940   AST 19 11/09/2015 0940   ALT 23 11/09/2015 0940   BILITOT 0.52 11/09/2015 0940       Lab Results  Component Value Date   WBC 7.4 11/09/2015   HGB 13.7 11/09/2015   HCT 40.9 11/09/2015   MCV 82.3 11/09/2015   PLT 241 11/09/2015   NEUTROABS 5.4 11/09/2015   ASSESSMENT & PLAN:  Breast cancer of upper-inner quadrant of right female breast Right Lumpectomy: 09/14/2015 IDC Grade 2/3, 1.2 cm, LVI Present, 0/1 LN T1CN0 (Stage 1A) Oncotype DX 44, 30% ROR Oncotype DX counseling: I discussed the result of Oncotype DX scoring provided her with a copy of this report. Based upon her risk score of 44, with tamoxifen alone the risk of recurrence of 30%. This is very high risk and I recommended systemic chemotherapy.  Treatment plan: 1. Adjuvant chemotherapy with dose dense Adriamycin and Cytoxan every 2 weeks 4 followed by Abraxane weekly 12 2. Followed by radiation 3. Followed by antiestrogen therapy ------------------------------------------------------------------------------------------------------------------------------------------------------------ Current treatment: Cycle 1 day 1 dose dense Adriamycin and Cytoxan Chemotherapy monitoring: Echo 09/29/2015 EF 60-65% Antiemetics were reviewed Chemotherapy class completed Monitoring closely for chemotherapy toxicities  Return to clinic in 1 week for toxicity check   No orders of the defined types were placed in this encounter.   The patient has a good understanding of the overall plan. she agrees with it. she will call with any problems that may develop before the next visit here.   Rulon Eisenmenger, MD 11/09/2015

## 2015-11-09 NOTE — Assessment & Plan Note (Signed)
Right Lumpectomy: 09/14/2015 IDC Grade 2/3, 1.2 cm, LVI Present, 0/1 LN T1CN0 (Stage 1A) Oncotype DX 44, 30% ROR Oncotype DX counseling: I discussed the result of Oncotype DX scoring provided her with a copy of this report. Based upon her risk score of 44, with tamoxifen alone the risk of recurrence of 30%. This is very high risk and I recommended systemic chemotherapy.  Treatment plan: 1. Adjuvant chemotherapy with dose dense Adriamycin and Cytoxan every 2 weeks 4 followed by Abraxane weekly 12 2. Followed by radiation 3. Followed by antiestrogen therapy ------------------------------------------------------------------------------------------------------------------------------------------------------------ Current treatment: Cycle 1 day 1 dose dense Adriamycin and Cytoxan Chemotherapy monitoring: Echo 09/29/2015 EF 60-65% Antiemetics were reviewed Chemotherapy class is being done  Return to clinic in 1 week for toxicity check

## 2015-11-09 NOTE — Addendum Note (Signed)
Addended by: Prentiss Bells on: 11/09/2015 04:08 PM   Modules accepted: Medications

## 2015-11-10 ENCOUNTER — Telehealth: Payer: Self-pay

## 2015-11-10 NOTE — Telephone Encounter (Signed)
[facility] - Checking on patient after 1st chemo.  Pt to return call to clinic.

## 2015-11-16 ENCOUNTER — Other Ambulatory Visit (HOSPITAL_BASED_OUTPATIENT_CLINIC_OR_DEPARTMENT_OTHER): Payer: BLUE CROSS/BLUE SHIELD

## 2015-11-16 ENCOUNTER — Telehealth: Payer: Self-pay | Admitting: Hematology and Oncology

## 2015-11-16 ENCOUNTER — Encounter: Payer: Self-pay | Admitting: Hematology and Oncology

## 2015-11-16 ENCOUNTER — Ambulatory Visit (HOSPITAL_BASED_OUTPATIENT_CLINIC_OR_DEPARTMENT_OTHER): Payer: BLUE CROSS/BLUE SHIELD | Admitting: Hematology and Oncology

## 2015-11-16 VITALS — BP 131/86 | HR 101 | Temp 98.4°F | Resp 18 | Ht 61.0 in | Wt 187.1 lb

## 2015-11-16 DIAGNOSIS — C50211 Malignant neoplasm of upper-inner quadrant of right female breast: Secondary | ICD-10-CM | POA: Diagnosis not present

## 2015-11-16 DIAGNOSIS — T451X5A Adverse effect of antineoplastic and immunosuppressive drugs, initial encounter: Secondary | ICD-10-CM

## 2015-11-16 DIAGNOSIS — D701 Agranulocytosis secondary to cancer chemotherapy: Secondary | ICD-10-CM | POA: Insufficient documentation

## 2015-11-16 DIAGNOSIS — D709 Neutropenia, unspecified: Secondary | ICD-10-CM

## 2015-11-16 LAB — COMPREHENSIVE METABOLIC PANEL
ALT: 18 U/L (ref 0–55)
ANION GAP: 8 meq/L (ref 3–11)
AST: 12 U/L (ref 5–34)
Albumin: 3.6 g/dL (ref 3.5–5.0)
Alkaline Phosphatase: 91 U/L (ref 40–150)
BUN: 10.3 mg/dL (ref 7.0–26.0)
CALCIUM: 9.4 mg/dL (ref 8.4–10.4)
CHLORIDE: 103 meq/L (ref 98–109)
CO2: 26 meq/L (ref 22–29)
CREATININE: 0.7 mg/dL (ref 0.6–1.1)
EGFR: >90 mL/min/{1.73_m2} (ref >90)
Glucose: 103 mg/dl (ref 70–140)
POTASSIUM: 4.5 meq/L (ref 3.5–5.1)
Sodium: 137 mEq/L (ref 136–145)
Total Bilirubin: 0.51 mg/dL (ref 0.20–1.20)
Total Protein: 6.6 g/dL (ref 6.4–8.3)

## 2015-11-16 LAB — CBC WITH DIFFERENTIAL/PLATELET
BASO%: 0.8 % (ref 0.0–2.0)
BASOS ABS: 0 10*3/uL (ref 0.0–0.1)
EOS%: 5.6 % (ref 0.0–7.0)
Eosinophils Absolute: 0.1 10*3/uL (ref 0.0–0.5)
HCT: 40.1 % (ref 34.8–46.6)
HGB: 13.4 g/dL (ref 11.6–15.9)
LYMPH%: 36.3 % (ref 14.0–49.7)
MCH: 27.5 pg (ref 25.1–34.0)
MCHC: 33.5 g/dL (ref 31.5–36.0)
MCV: 82.2 fL (ref 79.5–101.0)
MONO#: 0.3 10*3/uL (ref 0.1–0.9)
MONO%: 17.7 % — ABNORMAL HIGH (ref 0.0–14.0)
NEUT#: 0.7 10*3/uL — ABNORMAL LOW (ref 1.5–6.5)
NEUT%: 39.6 % (ref 38.4–76.8)
PLATELETS: 161 10*3/uL (ref 145–400)
RBC: 4.89 10*6/uL (ref 3.70–5.45)
RDW: 12.8 % (ref 11.2–14.5)
WBC: 1.8 10*3/uL — ABNORMAL LOW (ref 3.9–10.3)
lymph#: 0.7 10*3/uL — ABNORMAL LOW (ref 0.9–3.3)

## 2015-11-16 NOTE — Telephone Encounter (Signed)
Appointments made and avs printed for patient °

## 2015-11-16 NOTE — Progress Notes (Signed)
Patient Care Team: No Pcp Per Patient as PCP - General (General Practice) Brien Few, MD as Consulting Physician (Obstetrics and Gynecology) Rolm Bookbinder, MD as Consulting Physician (General Surgery) Nicholas Lose, MD as Consulting Physician (Hematology and Oncology) Arloa Koh, MD as Consulting Physician (Radiation Oncology) Rockwell Germany, RN as Registered Nurse Mauro Kaufmann, RN as Registered Nurse Sylvan Cheese, NP as Nurse Practitioner (Nurse Practitioner)  DIAGNOSIS: Breast cancer of upper-inner quadrant of right female breast [facility])   Staging form: Breast, AJCC 7th Edition     Clinical stage from 08/17/2015: Stage IA (T1c, N0, M0) - Unsigned       Staging comments: Staged at breast conference on 9.7.16    SUMMARY OF ONCOLOGIC HISTORY:   Breast cancer of upper-inner quadrant of right female breast ([place])   08/10/2015 Mammogram Right breast mass 1.2 cm irregular spiculated   08/11/2015 Initial Diagnosis 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, grade 2-3   08/29/2015 Procedure  genetic testing normal (Genes tested includeATM, BARD1, BRCA1, BRCA2, BRIP1, CDH1, CHEK2, FANCC, MLH1, MSH2, MSH6, NBN, PALB2, PMS2, PTEN, RAD51C, RAD51D, TP53, and XRCC2)   09/14/2015 Surgery Right Lumpectomy: IDC Grade 2/3, 1.2 cm, LVI Present, 0/1 LN T1CN0 (Stage 1A) Oncotype DX 44, 30% ROR   09/20/2015 Procedure genetic testing revealed no pathogenic mutations   11/09/2015 -  Chemotherapy adjuvant chemotherapy: Adriamycin and Cytoxan dose dense 4 followed by Abraxane weekly 12    CHIEF COMPLIANT: cycle 1 day 8 dose dense Adriamycin and Cytoxan  INTERVAL HISTORY: Jessica Petty is a 39 year old with a homogeneous right breast cancer currently undergoing adjuvant chemotherapy with dose dense Adriamycin and Cytoxan. Today is cycle 1 day 8. She tolerated first cycle extremely well. She had mild abdominal discomfort but denied any nausea or vomiting. She was eating well.  The dexamethasone increase her appetite significantly. However she ate mostly fruits and vegetables. Denied any constipation or diarrhea. Denies any mouth sores.  REVIEW OF SYSTEMS:   Constitutional: Denies fevers, chills or abnormal weight loss Eyes: Denies blurriness of vision Ears, nose, mouth, throat, and face: Denies mucositis or sore throat Respiratory: Denies cough, dyspnea or wheezes Cardiovascular: Denies palpitation, chest discomfort or lower extremity swelling Gastrointestinal:  Denies nausea, heartburn or change in bowel habits Skin: Denies abnormal skin rashes Lymphatics: Denies new lymphadenopathy or easy bruising Neurological:Denies numbness, tingling or new weaknesses Behavioral/Psych: Mood is stable, no new changes  Breast:  denies any pain or lumps or nodules in either breasts All other systems were reviewed with the patient and are negative.  I have reviewed the past medical history, past surgical history, social history and family history with the patient and they are unchanged from previous note.  ALLERGIES:  has No Known Allergies.  MEDICATIONS:  Current Outpatient Prescriptions  Medication Sig Dispense Refill  . ADDERALL XR 20 MG 24 hr capsule Take 20 mg by mouth.  0  . ALPRAZolam (XANAX) 1 MG tablet Take 1 mg by mouth 3 (three) times daily as needed.  5  . ARIPiprazole (ABILIFY) 10 MG tablet Take 10 mg by mouth at bedtime.  11  . dexamethasone (DECADRON) 4 MG tablet Take 1 tablets by mouth once a day on the day after chemotherapy and then take 1 tablets two times a day for 2 days. Take with food. 30 tablet 1  . HYDROcodone-acetaminophen (NORCO) 10-325 MG tablet Take 1 tablet by mouth every 6 (six) hours as needed. 20 tablet 0  . lidocaine-prilocaine (EMLA) cream  Apply to affected area once 30 g 3  . LORazepam (ATIVAN) 0.5 MG tablet Take 1 tablet (0.5 mg total) by mouth at bedtime. 30 tablet 0  . ondansetron (ZOFRAN) 8 MG tablet Take 1 tablet (8 mg total) by mouth  2 (two) times daily as needed. Start on the third day after chemotherapy. 30 tablet 1  . oxyCODONE-acetaminophen (PERCOCET) 10-325 MG tablet Take 1 tablet by mouth every 6 (six) hours as needed for pain. 20 tablet 0  . prochlorperazine (COMPAZINE) 10 MG tablet Take 1 tablet (10 mg total) by mouth every 6 (six) hours as needed (Nausea or vomiting). 30 tablet 1  . Vitamin D, Ergocalciferol, (DRISDOL) 50000 UNITS CAPS capsule TAKE 1 CAPSULE BY MOUTH 2 TIMES WEEKLY AS DIRECTED  12   No current facility-administered medications for this visit.    PHYSICAL EXAMINATION: ECOG PERFORMANCE STATUS: 1 - Symptomatic but completely ambulatory  Filed Vitals:   11/16/15 0849  BP: 131/86  Pulse: 101  Temp: 98.4 F (36.9 C)  Resp: 18   Filed Weights   11/16/15 0849  Weight: 187 lb 1.6 oz (84.868 kg)    GENERAL:alert, no distress and comfortable SKIN: skin color, texture, turgor are normal, no rashes or significant lesions EYES: normal, Conjunctiva are pink and non-injected, sclera clear OROPHARYNX:no exudate, no erythema and lips, buccal mucosa, and tongue normal  NECK: supple, thyroid normal size, non-tender, without nodularity LYMPH:  no palpable lymphadenopathy in the cervical, axillary or inguinal LUNGS: clear to auscultation and percussion with normal breathing effort HEART: regular rate & rhythm and no murmurs and no lower extremity edema ABDOMEN:abdomen soft, non-tender and normal bowel sounds Musculoskeletal:no cyanosis of digits and no clubbing  NEURO: alert & oriented x 3 with fluent speech, no focal motor/sensory deficits  LABORATORY DATA:  I have reviewed the data as listed   Chemistry      Component Value Date/Time   NA 139 11/09/2015 0940   K 4.0 11/09/2015 0940   CO2 27 11/09/2015 0940   BUN 11.7 11/09/2015 0940   CREATININE 0.8 11/09/2015 0940      Component Value Date/Time   CALCIUM 9.6 11/09/2015 0940   ALKPHOS 67 11/09/2015 0940   AST 19 11/09/2015 0940   ALT 23  11/09/2015 0940   BILITOT 0.52 11/09/2015 0940       Lab Results  Component Value Date   WBC 1.8* 11/16/2015   HGB 13.4 11/16/2015   HCT 40.1 11/16/2015   MCV 82.2 11/16/2015   PLT 161 11/16/2015   NEUTROABS 0.7* 11/16/2015   ASSESSMENT & PLAN:  Breast cancer of upper-inner quadrant of right female breast Right Lumpectomy: 09/14/2015 IDC Grade 2/3, 1.2 cm, LVI Present, 0/1 LN T1CN0 (Stage 1A) Oncotype DX 44, 30% ROR Oncotype DX counseling: I discussed the result of Oncotype DX scoring provided her with a copy of this report. Based upon her risk score of 44, with tamoxifen alone the risk of recurrence of 30%. This is very high risk and I recommended systemic chemotherapy.  Treatment plan: 1. Adjuvant chemotherapy with dose dense Adriamycin and Cytoxan every 2 weeks 4 followed by Abraxane weekly 12 2. Followed by radiation 3. Followed by antiestrogen therapy ------------------------------------------------------------------------------------------------------------------------------------------------------------ Current treatment: Cycle 1 day 1 dose dense Adriamycin and Cytoxan Chemotherapy monitoring: Echo 09/29/2015 EF 60-65%  Chemotoxicities: Denied any major side effects to chemotherapy. Denied any nausea vomiting. 1. Increased appetite from dexamethasone: I decreased the dexamethasone for the next cycle to 1 tablet daily for 2 days after chemotherapy  Blood counts were reviewed and she is mildly neutropenic. This is expected for this time of the treatment. We will keep the dosage the same. Monitoring closely for chemotherapy toxicities  Return to clinic in 1 week for cycle 2    No orders of the defined types were placed in this encounter.   The patient has a good understanding of the overall plan. she agrees with it. she will call with any problems that may develop before the next visit here.   Rulon Eisenmenger, MD 11/16/2015

## 2015-11-16 NOTE — Assessment & Plan Note (Signed)
Right Lumpectomy: 09/14/2015 IDC Grade 2/3, 1.2 cm, LVI Present, 0/1 LN T1CN0 (Stage 1A) Oncotype DX 44, 30% ROR Oncotype DX counseling: I discussed the result of Oncotype DX scoring provided her with a copy of this report. Based upon her risk score of 44, with tamoxifen alone the risk of recurrence of 30%. This is very high risk and I recommended systemic chemotherapy.  Treatment plan: 1. Adjuvant chemotherapy with dose dense Adriamycin and Cytoxan every 2 weeks 4 followed by Abraxane weekly 12 2. Followed by radiation 3. Followed by antiestrogen therapy ------------------------------------------------------------------------------------------------------------------------------------------------------------ Current treatment: Cycle 1 day 1 dose dense Adriamycin and Cytoxan Chemotherapy monitoring: Echo 09/29/2015 EF 60-65%  Chemotoxicities: Denied any major side effects to chemotherapy. Denied any nausea vomiting. 1. Increased appetite from dexamethasone: I decreased the dexamethasone for the next cycle to 1 tablet daily for 2 days after chemotherapy Blood counts were reviewed and she is mildly neutropenic. This is expected for this time of the treatment. We will keep the dosage the same. Monitoring closely for chemotherapy toxicities  Return to clinic in 1 week for cycle 2

## 2015-11-22 NOTE — Assessment & Plan Note (Signed)
Right Lumpectomy: 09/14/2015 IDC Grade 2/3, 1.2 cm, LVI Present, 0/1 LN T1CN0 (Stage 1A) Oncotype DX 44, 30% ROR Oncotype DX counseling: I discussed the result of Oncotype DX scoring provided her with a copy of this report. Based upon her risk score of 44, with tamoxifen alone the risk of recurrence of 30%. This is very high risk and I recommended systemic chemotherapy.  Treatment plan: 1. Adjuvant chemotherapy with dose dense Adriamycin and Cytoxan every 2 weeks 4 followed by Abraxane weekly 12 2. Followed by radiation 3. Followed by antiestrogen therapy ------------------------------------------------------------------------------------------------------------------------------------------------------------ Current treatment: Cycle 2 day 1 dose dense Adriamycin and Cytoxan Chemotherapy monitoring: Echo 09/29/2015 EF 60-65%  Chemotoxicities: Denied any major side effects to chemotherapy. Denied any nausea vomiting. 1. Increased appetite from dexamethasone: I decreased the dexamethasone for the next cycle to 1 tablet daily for 2 days after chemotherapy  Monitoring closely for chemotherapy toxicities  Return to clinic in 2 week for cycle 3

## 2015-11-23 ENCOUNTER — Telehealth: Payer: Self-pay | Admitting: Hematology and Oncology

## 2015-11-23 ENCOUNTER — Other Ambulatory Visit (HOSPITAL_BASED_OUTPATIENT_CLINIC_OR_DEPARTMENT_OTHER): Payer: BLUE CROSS/BLUE SHIELD

## 2015-11-23 ENCOUNTER — Ambulatory Visit (HOSPITAL_BASED_OUTPATIENT_CLINIC_OR_DEPARTMENT_OTHER): Payer: BLUE CROSS/BLUE SHIELD | Admitting: Hematology and Oncology

## 2015-11-23 ENCOUNTER — Ambulatory Visit (HOSPITAL_BASED_OUTPATIENT_CLINIC_OR_DEPARTMENT_OTHER): Payer: BLUE CROSS/BLUE SHIELD

## 2015-11-23 ENCOUNTER — Encounter: Payer: Self-pay | Admitting: Hematology and Oncology

## 2015-11-23 VITALS — BP 133/81 | HR 102 | Resp 18 | Ht 61.0 in | Wt 185.4 lb

## 2015-11-23 DIAGNOSIS — Z5189 Encounter for other specified aftercare: Secondary | ICD-10-CM

## 2015-11-23 DIAGNOSIS — Z5111 Encounter for antineoplastic chemotherapy: Secondary | ICD-10-CM | POA: Diagnosis not present

## 2015-11-23 DIAGNOSIS — Z170 Estrogen receptor positive status [ER+]: Secondary | ICD-10-CM

## 2015-11-23 DIAGNOSIS — C50211 Malignant neoplasm of upper-inner quadrant of right female breast: Secondary | ICD-10-CM

## 2015-11-23 LAB — CBC WITH DIFFERENTIAL/PLATELET
BASO%: 0.6 % (ref 0.0–2.0)
Basophils Absolute: 0 10*3/uL (ref 0.0–0.1)
EOS%: 0.3 % (ref 0.0–7.0)
Eosinophils Absolute: 0 10*3/uL (ref 0.0–0.5)
HCT: 38.5 % (ref 34.8–46.6)
HGB: 13 g/dL (ref 11.6–15.9)
LYMPH%: 19.1 % (ref 14.0–49.7)
MCH: 27.7 pg (ref 25.1–34.0)
MCHC: 33.8 g/dL (ref 31.5–36.0)
MCV: 81.9 fL (ref 79.5–101.0)
MONO#: 0.5 10*3/uL (ref 0.1–0.9)
MONO%: 6.9 % (ref 0.0–14.0)
NEUT%: 73.1 % (ref 38.4–76.8)
NEUTROS ABS: 5 10*3/uL (ref 1.5–6.5)
PLATELETS: 201 10*3/uL (ref 145–400)
RBC: 4.7 10*6/uL (ref 3.70–5.45)
RDW: 12.7 % (ref 11.2–14.5)
WBC: 6.8 10*3/uL (ref 3.9–10.3)
lymph#: 1.3 10*3/uL (ref 0.9–3.3)

## 2015-11-23 LAB — COMPREHENSIVE METABOLIC PANEL
ALT: 21 U/L (ref 0–55)
AST: 16 U/L (ref 5–34)
Albumin: 3.6 g/dL (ref 3.5–5.0)
Alkaline Phosphatase: 78 U/L (ref 40–150)
Anion Gap: 7 mEq/L (ref 3–11)
BILIRUBIN TOTAL: 0.31 mg/dL (ref 0.20–1.20)
BUN: 8.1 mg/dL (ref 7.0–26.0)
CALCIUM: 9.4 mg/dL (ref 8.4–10.4)
CHLORIDE: 104 meq/L (ref 98–109)
CO2: 26 meq/L (ref 22–29)
Creatinine: 0.8 mg/dL (ref 0.6–1.1)
GLUCOSE: 103 mg/dL (ref 70–140)
POTASSIUM: 4 meq/L (ref 3.5–5.1)
Sodium: 138 mEq/L (ref 136–145)
Total Protein: 6.5 g/dL (ref 6.4–8.3)

## 2015-11-23 MED ORDER — HEPARIN SOD (PORK) LOCK FLUSH 100 UNIT/ML IV SOLN
500.0000 [IU] | Freq: Once | INTRAVENOUS | Status: AC | PRN
  Administered 2015-11-23: 500 [IU]
  Filled 2015-11-23: qty 5

## 2015-11-23 MED ORDER — SODIUM CHLORIDE 0.9 % IV SOLN
Freq: Once | INTRAVENOUS | Status: AC
  Administered 2015-11-23: 13:00:00 via INTRAVENOUS
  Filled 2015-11-23: qty 5

## 2015-11-23 MED ORDER — PALONOSETRON HCL INJECTION 0.25 MG/5ML
INTRAVENOUS | Status: AC
  Filled 2015-11-23: qty 5

## 2015-11-23 MED ORDER — DOXORUBICIN HCL CHEMO IV INJECTION 2 MG/ML
60.0000 mg/m2 | Freq: Once | INTRAVENOUS | Status: AC
  Administered 2015-11-23: 114 mg via INTRAVENOUS
  Filled 2015-11-23: qty 57

## 2015-11-23 MED ORDER — SODIUM CHLORIDE 0.9 % IV SOLN
Freq: Once | INTRAVENOUS | Status: AC
  Administered 2015-11-23: 13:00:00 via INTRAVENOUS

## 2015-11-23 MED ORDER — SODIUM CHLORIDE 0.9 % IJ SOLN
10.0000 mL | INTRAMUSCULAR | Status: DC | PRN
  Administered 2015-11-23: 10 mL
  Filled 2015-11-23: qty 10

## 2015-11-23 MED ORDER — PALONOSETRON HCL INJECTION 0.25 MG/5ML
0.2500 mg | Freq: Once | INTRAVENOUS | Status: AC
  Administered 2015-11-23: 0.25 mg via INTRAVENOUS

## 2015-11-23 MED ORDER — CYCLOPHOSPHAMIDE CHEMO INJECTION 1 GM
600.0000 mg/m2 | Freq: Once | INTRAMUSCULAR | Status: AC
  Administered 2015-11-23: 1140 mg via INTRAVENOUS
  Filled 2015-11-23: qty 57

## 2015-11-23 MED ORDER — PEGFILGRASTIM 6 MG/0.6ML ~~LOC~~ PSKT
6.0000 mg | PREFILLED_SYRINGE | Freq: Once | SUBCUTANEOUS | Status: AC
  Administered 2015-11-23: 6 mg via SUBCUTANEOUS
  Filled 2015-11-23: qty 0.6

## 2015-11-23 NOTE — Telephone Encounter (Signed)
Gave patient appt for 12/28 @ 10 labs, 10:15 MD. Per pof scheduled printed.

## 2015-11-23 NOTE — Progress Notes (Signed)
Patient Care Team: No Pcp Per Patient as PCP - General (General Practice) Brien Few, MD as Consulting Physician (Obstetrics and Gynecology) Rolm Bookbinder, MD as Consulting Physician (General Surgery) Nicholas Lose, MD as Consulting Physician (Hematology and Oncology) Arloa Koh, MD as Consulting Physician (Radiation Oncology) Rockwell Germany, RN as Registered Nurse Mauro Kaufmann, RN as Registered Nurse Sylvan Cheese, NP as Nurse Practitioner (Nurse Practitioner)  DIAGNOSIS: Breast cancer of upper-inner quadrant of right female breast [facility])   Staging form: Breast, AJCC 7th Edition     Clinical stage from 08/17/2015: Stage IA (T1c, N0, M0) - Unsigned       Staging comments: Staged at breast conference on 9.7.16    SUMMARY OF ONCOLOGIC HISTORY:   Breast cancer of upper-inner quadrant of right female breast ([place])   08/10/2015 Mammogram Right breast mass 1.2 cm irregular spiculated   08/11/2015 Initial Diagnosis 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, grade 2-3   08/29/2015 Procedure  genetic testing normal (Genes tested includeATM, BARD1, BRCA1, BRCA2, BRIP1, CDH1, CHEK2, FANCC, MLH1, MSH2, MSH6, NBN, PALB2, PMS2, PTEN, RAD51C, RAD51D, TP53, and XRCC2)   09/14/2015 Surgery Right Lumpectomy: IDC Grade 2/3, 1.2 cm, LVI Present, 0/1 LN T1CN0 (Stage 1A) Oncotype DX 44, 30% ROR   09/20/2015 Procedure genetic testing revealed no pathogenic mutations   11/09/2015 -  Chemotherapy adjuvant chemotherapy: Adriamycin and Cytoxan dose dense 4 followed by Abraxane weekly 12    CHIEF COMPLIANT: Cycle 2dose dense Adriamycin and cytoxan  INTERVAL HISTORY: Jessica Petty is a 39 year old with above-mentioned history of right breast cancer who underwent lumpectomy and is here to receive cycle 2 of adjuvant chemotherapy with dose dense Adriamycin and Cytoxan. She tolerated cycle one extremely well. Did not have any nausea or vomiting. For the past week she had no further  problems or concerns. Denies any diarrhea or constipation. Denies any fevers or chills.  REVIEW OF SYSTEMS:   Constitutional: Denies fevers, chills or abnormal weight loss Eyes: Denies blurriness of vision Ears, nose, mouth, throat, and face: Denies mucositis or sore throat Respiratory: Denies cough, dyspnea or wheezes Cardiovascular: Denies palpitation, chest discomfort or lower extremity swelling Gastrointestinal:  Denies nausea, heartburn or change in bowel habits Skin: Denies abnormal skin rashes Lymphatics: Denies new lymphadenopathy or easy bruising Neurological:Denies numbness, tingling or new weaknesses Behavioral/Psych: Mood is stable, no new changes  Breast:  denies any pain or lumps or nodules in either breasts All other systems were reviewed with the patient and are negative.  I have reviewed the past medical history, past surgical history, social history and family history with the patient and they are unchanged from previous note.  ALLERGIES:  has No Known Allergies.  MEDICATIONS:  Current Outpatient Prescriptions  Medication Sig Dispense Refill  . ADDERALL XR 20 MG 24 hr capsule Take 20 mg by mouth.  0  . ALPRAZolam (XANAX) 1 MG tablet Take 1 mg by mouth 3 (three) times daily as needed.  5  . ARIPiprazole (ABILIFY) 10 MG tablet Take 10 mg by mouth at bedtime.  11  . dexamethasone (DECADRON) 4 MG tablet Take 1 tablets by mouth once a day on the day after chemotherapy and then take 1 tablets two times a day for 2 days. Take with food. 30 tablet 1  . HYDROcodone-acetaminophen (NORCO) 10-325 MG tablet Take 1 tablet by mouth every 6 (six) hours as needed. 20 tablet 0  . lidocaine-prilocaine (EMLA) cream Apply to affected area once 30 g 3  .  LORazepam (ATIVAN) 0.5 MG tablet Take 1 tablet (0.5 mg total) by mouth at bedtime. 30 tablet 0  . ondansetron (ZOFRAN) 8 MG tablet Take 1 tablet (8 mg total) by mouth 2 (two) times daily as needed. Start on the third day after chemotherapy.  30 tablet 1  . oxyCODONE-acetaminophen (PERCOCET) 10-325 MG tablet Take 1 tablet by mouth every 6 (six) hours as needed for pain. 20 tablet 0  . prochlorperazine (COMPAZINE) 10 MG tablet Take 1 tablet (10 mg total) by mouth every 6 (six) hours as needed (Nausea or vomiting). 30 tablet 1  . Vitamin D, Ergocalciferol, (DRISDOL) 50000 UNITS CAPS capsule TAKE 1 CAPSULE BY MOUTH 2 TIMES WEEKLY AS DIRECTED  12   No current facility-administered medications for this visit.    PHYSICAL EXAMINATION: ECOG PERFORMANCE STATUS: 0 - Asymptomatic  Filed Vitals:   11/23/15 1150  BP: 133/81  Pulse: 102  Resp: 18   Filed Weights   11/23/15 1150  Weight: 185 lb 6.4 oz (84.097 kg)    GENERAL:alert, no distress and comfortable SKIN: skin color, texture, turgor are normal, no rashes or significant lesions EYES: normal, Conjunctiva are pink and non-injected, sclera clear OROPHARYNX:no exudate, no erythema and lips, buccal mucosa, and tongue normal  NECK: supple, thyroid normal size, non-tender, without nodularity LYMPH:  no palpable lymphadenopathy in the cervical, axillary or inguinal LUNGS: clear to auscultation and percussion with normal breathing effort HEART: regular rate & rhythm and no murmurs and no lower extremity edema ABDOMEN:abdomen soft, non-tender and normal bowel sounds Musculoskeletal:no cyanosis of digits and no clubbing  NEURO: alert & oriented x 3 with fluent speech, no focal motor/sensory deficits   LABORATORY DATA:  I have reviewed the data as listed   Chemistry      Component Value Date/Time   NA 137 11/16/2015 0839   K 4.5 11/16/2015 0839   CO2 26 11/16/2015 0839   BUN 10.3 11/16/2015 0839   CREATININE 0.7 11/16/2015 0839      Component Value Date/Time   CALCIUM 9.4 11/16/2015 0839   ALKPHOS 91 11/16/2015 0839   AST 12 11/16/2015 0839   ALT 18 11/16/2015 0839   BILITOT 0.51 11/16/2015 0839       Lab Results  Component Value Date   WBC 6.8 11/23/2015   HGB  13.0 11/23/2015   HCT 38.5 11/23/2015   MCV 81.9 11/23/2015   PLT 201 11/23/2015   NEUTROABS 5.0 11/23/2015   ASSESSMENT & PLAN:  Breast cancer of upper-inner quadrant of right female breast Right Lumpectomy: 09/14/2015 IDC Grade 2/3, 1.2 cm, LVI Present, 0/1 LN T1CN0 (Stage 1A) Oncotype DX 44, 30% ROR Oncotype DX counseling: I discussed the result of Oncotype DX scoring provided her with a copy of this report. Based upon her risk score of 44, with tamoxifen alone the risk of recurrence of 30%. This is very high risk and I recommended systemic chemotherapy.  Treatment plan: 1. Adjuvant chemotherapy with dose dense Adriamycin and Cytoxan every 2 weeks 4 followed by Abraxane weekly 12 2. Followed by radiation 3. Followed by antiestrogen therapy ------------------------------------------------------------------------------------------------------------------------------------------------------------ Current treatment: Cycle 2 day 1 dose dense Adriamycin and Cytoxan Chemotherapy monitoring: Echo 09/29/2015 EF 60-65%  Chemotoxicities: Denied any major side effects to chemotherapy. Denied any nausea vomiting. 1. Increased appetite from dexamethasone: I decreased the dexamethasone for the next cycle to 1 tablet daily for 2 days after chemotherapy 2. Neutropenia nadir count which is expected due to chemotherapy. Blood counts were reviewed from today and there  were normal.  Monitoring closely for chemotherapy toxicities  Return to clinic in 2 week for cycle 3  No orders of the defined types were placed in this encounter.   The patient has a good understanding of the overall plan. she agrees with it. she will call with any problems that may develop before the next visit here.   Rulon Eisenmenger, MD 11/23/2015

## 2015-12-06 NOTE — Assessment & Plan Note (Signed)
Right Lumpectomy: 09/14/2015 IDC Grade 2/3, 1.2 cm, LVI Present, 0/1 LN T1CN0 (Stage 1A) Oncotype DX 44, 30% ROR Oncotype DX counseling: I discussed the result of Oncotype DX scoring provided her with a copy of this report. Based upon her risk score of 44, with tamoxifen alone the risk of recurrence of 30%. This is very high risk and I recommended systemic chemotherapy.  Treatment plan: 1. Adjuvant chemotherapy with dose dense Adriamycin and Cytoxan every 2 weeks 4 followed by Abraxane weekly 12 2. Followed by radiation 3. Followed by antiestrogen therapy ------------------------------------------------------------------------------------------------------------------------------------------------------------ Current treatment: Cycle 3 day 1 dose dense Adriamycin and Cytoxan Chemotherapy monitoring: Echo 09/29/2015 EF 60-65%  Chemotoxicities: Denied any major side effects to chemotherapy. Denied any nausea vomiting. 1. Increased appetite from dexamethasone: I decreased the dexamethasone for the next cycle to 1 tablet daily for 2 days after chemotherapy 2. Neutropenia nadir count which is expected due to chemotherapy. Blood counts were reviewed from today and there were normal.  Monitoring closely for chemotherapy toxicities  Return to clinic in 2 week for cycle 4

## 2015-12-07 ENCOUNTER — Other Ambulatory Visit (HOSPITAL_BASED_OUTPATIENT_CLINIC_OR_DEPARTMENT_OTHER): Payer: BLUE CROSS/BLUE SHIELD

## 2015-12-07 ENCOUNTER — Ambulatory Visit (HOSPITAL_BASED_OUTPATIENT_CLINIC_OR_DEPARTMENT_OTHER): Payer: BLUE CROSS/BLUE SHIELD

## 2015-12-07 ENCOUNTER — Telehealth: Payer: Self-pay | Admitting: Hematology and Oncology

## 2015-12-07 ENCOUNTER — Encounter: Payer: Self-pay | Admitting: Hematology and Oncology

## 2015-12-07 ENCOUNTER — Ambulatory Visit (HOSPITAL_BASED_OUTPATIENT_CLINIC_OR_DEPARTMENT_OTHER): Payer: BLUE CROSS/BLUE SHIELD | Admitting: Hematology and Oncology

## 2015-12-07 VITALS — BP 136/87 | HR 87 | Temp 97.7°F | Resp 18 | Ht 61.0 in | Wt 187.4 lb

## 2015-12-07 DIAGNOSIS — Z5189 Encounter for other specified aftercare: Secondary | ICD-10-CM | POA: Diagnosis not present

## 2015-12-07 DIAGNOSIS — Z170 Estrogen receptor positive status [ER+]: Secondary | ICD-10-CM | POA: Diagnosis not present

## 2015-12-07 DIAGNOSIS — D6481 Anemia due to antineoplastic chemotherapy: Secondary | ICD-10-CM | POA: Diagnosis not present

## 2015-12-07 DIAGNOSIS — Z5111 Encounter for antineoplastic chemotherapy: Secondary | ICD-10-CM

## 2015-12-07 DIAGNOSIS — C50211 Malignant neoplasm of upper-inner quadrant of right female breast: Secondary | ICD-10-CM | POA: Diagnosis not present

## 2015-12-07 DIAGNOSIS — K1231 Oral mucositis (ulcerative) due to antineoplastic therapy: Secondary | ICD-10-CM | POA: Insufficient documentation

## 2015-12-07 DIAGNOSIS — D701 Agranulocytosis secondary to cancer chemotherapy: Secondary | ICD-10-CM | POA: Diagnosis not present

## 2015-12-07 LAB — CBC WITH DIFFERENTIAL/PLATELET
BASO%: 0.6 % (ref 0.0–2.0)
BASOS ABS: 0 10*3/uL (ref 0.0–0.1)
EOS ABS: 0 10*3/uL (ref 0.0–0.5)
EOS%: 0.1 % (ref 0.0–7.0)
HEMATOCRIT: 36.8 % (ref 34.8–46.6)
HGB: 12.3 g/dL (ref 11.6–15.9)
LYMPH#: 1 10*3/uL (ref 0.9–3.3)
LYMPH%: 13.9 % — ABNORMAL LOW (ref 14.0–49.7)
MCH: 27.1 pg (ref 25.1–34.0)
MCHC: 33.5 g/dL (ref 31.5–36.0)
MCV: 81 fL (ref 79.5–101.0)
MONO#: 0.5 10*3/uL (ref 0.1–0.9)
MONO%: 7.3 % (ref 0.0–14.0)
NEUT#: 5.6 10*3/uL (ref 1.5–6.5)
NEUT%: 78.1 % — AB (ref 38.4–76.8)
PLATELETS: 188 10*3/uL (ref 145–400)
RBC: 4.54 10*6/uL (ref 3.70–5.45)
RDW: 13.3 % (ref 11.2–14.5)
WBC: 7.1 10*3/uL (ref 3.9–10.3)

## 2015-12-07 LAB — COMPREHENSIVE METABOLIC PANEL
ALT: 16 U/L (ref 0–55)
AST: 14 U/L (ref 5–34)
Albumin: 3.6 g/dL (ref 3.5–5.0)
Alkaline Phosphatase: 79 U/L (ref 40–150)
Anion Gap: 9 mEq/L (ref 3–11)
BUN: 6.8 mg/dL — AB (ref 7.0–26.0)
CHLORIDE: 106 meq/L (ref 98–109)
CO2: 24 mEq/L (ref 22–29)
Calcium: 9.2 mg/dL (ref 8.4–10.4)
Creatinine: 0.7 mg/dL (ref 0.6–1.1)
EGFR: >90 mL/min/{1.73_m2} (ref >90)
GLUCOSE: 108 mg/dL (ref 70–140)
POTASSIUM: 4 meq/L (ref 3.5–5.1)
SODIUM: 140 meq/L (ref 136–145)
Total Bilirubin: <0.3 mg/dL (ref 0.20–1.20)
Total Protein: 6.5 g/dL (ref 6.4–8.3)

## 2015-12-07 MED ORDER — PALONOSETRON HCL INJECTION 0.25 MG/5ML
0.2500 mg | Freq: Once | INTRAVENOUS | Status: AC
  Administered 2015-12-07: 0.25 mg via INTRAVENOUS

## 2015-12-07 MED ORDER — PALONOSETRON HCL INJECTION 0.25 MG/5ML
INTRAVENOUS | Status: AC
  Filled 2015-12-07: qty 5

## 2015-12-07 MED ORDER — SODIUM CHLORIDE 0.9 % IJ SOLN
10.0000 mL | INTRAMUSCULAR | Status: DC | PRN
  Administered 2015-12-07: 10 mL
  Filled 2015-12-07: qty 10

## 2015-12-07 MED ORDER — SODIUM CHLORIDE 0.9 % IV SOLN
600.0000 mg/m2 | Freq: Once | INTRAVENOUS | Status: AC
  Administered 2015-12-07: 1140 mg via INTRAVENOUS
  Filled 2015-12-07: qty 57

## 2015-12-07 MED ORDER — DOXORUBICIN HCL CHEMO IV INJECTION 2 MG/ML
60.0000 mg/m2 | Freq: Once | INTRAVENOUS | Status: AC
  Administered 2015-12-07: 114 mg via INTRAVENOUS
  Filled 2015-12-07: qty 57

## 2015-12-07 MED ORDER — SODIUM CHLORIDE 0.9 % IV SOLN
Freq: Once | INTRAVENOUS | Status: AC
  Administered 2015-12-07: 11:00:00 via INTRAVENOUS
  Filled 2015-12-07: qty 5

## 2015-12-07 MED ORDER — HEPARIN SOD (PORK) LOCK FLUSH 100 UNIT/ML IV SOLN
500.0000 [IU] | Freq: Once | INTRAVENOUS | Status: AC | PRN
  Administered 2015-12-07: 500 [IU]
  Filled 2015-12-07: qty 5

## 2015-12-07 MED ORDER — SODIUM CHLORIDE 0.9 % IV SOLN
Freq: Once | INTRAVENOUS | Status: AC
  Administered 2015-12-07: 11:00:00 via INTRAVENOUS

## 2015-12-07 MED ORDER — PEGFILGRASTIM 6 MG/0.6ML ~~LOC~~ PSKT
6.0000 mg | PREFILLED_SYRINGE | Freq: Once | SUBCUTANEOUS | Status: AC
  Administered 2015-12-07: 6 mg via SUBCUTANEOUS
  Filled 2015-12-07: qty 0.6

## 2015-12-07 NOTE — Telephone Encounter (Signed)
Appointments made and avs pritned for patient °

## 2015-12-07 NOTE — Patient Instructions (Signed)
[facility] Discharge Instructions for Patients Receiving Chemotherapy  Today you received the following chemotherapy agents:Adriamycin and Cytoxan   To help prevent nausea and vomiting after your treatment, we encourage you to take your nausea medication as directed.    If you develop nausea and vomiting that is not controlled by your nausea medication, call the clinic.   BELOW ARE SYMPTOMS THAT SHOULD BE REPORTED IMMEDIATELY:  *FEVER GREATER THAN 100.5 F  *CHILLS WITH OR WITHOUT FEVER  NAUSEA AND VOMITING THAT IS NOT CONTROLLED WITH YOUR NAUSEA MEDICATION  *UNUSUAL SHORTNESS OF BREATH  *UNUSUAL BRUISING OR BLEEDING  TENDERNESS IN MOUTH AND THROAT WITH OR WITHOUT PRESENCE OF ULCERS  *URINARY PROBLEMS  *BOWEL PROBLEMS  UNUSUAL RASH Items with * indicate a potential emergency and should be followed up as soon as possible.  Feel free to call the clinic you have any questions or concerns. The clinic phone number is [phone].  Please show the CHEMO ALERT CARD at check-in to the Emergency Department and triage nurse.   

## 2015-12-07 NOTE — Progress Notes (Signed)
Patient Care Team: No Pcp Per Patient as PCP - General (General Practice) Brien Few, MD as Consulting Physician (Obstetrics and Gynecology) Rolm Bookbinder, MD as Consulting Physician (General Surgery) Nicholas Lose, MD as Consulting Physician (Hematology and Oncology) Arloa Koh, MD as Consulting Physician (Radiation Oncology) Rockwell Germany, RN as Registered Nurse Mauro Kaufmann, RN as Registered Nurse Sylvan Cheese, NP as Nurse Practitioner (Nurse Practitioner)  DIAGNOSIS: Breast cancer of upper-inner quadrant of right female breast [facility])   Staging form: Breast, AJCC 7th Edition     Clinical stage from 08/17/2015: Stage IA (T1c, N0, M0) - Unsigned       Staging comments: Staged at breast conference on 9.7.16  SUMMARY OF ONCOLOGIC HISTORY:   Breast cancer of upper-inner quadrant of right female breast ([place])   08/10/2015 Mammogram Right breast mass 1.2 cm irregular spiculated   08/11/2015 Initial Diagnosis 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, grade 2-3   08/29/2015 Procedure  genetic testing normal (Genes tested includeATM, BARD1, BRCA1, BRCA2, BRIP1, CDH1, CHEK2, FANCC, MLH1, MSH2, MSH6, NBN, PALB2, PMS2, PTEN, RAD51C, RAD51D, TP53, and XRCC2)   09/14/2015 Surgery Right Lumpectomy: IDC Grade 2/3, 1.2 cm, LVI Present, 0/1 LN T1CN0 (Stage 1A) Oncotype DX 44, 30% ROR   09/20/2015 Procedure genetic testing revealed no pathogenic mutations   11/09/2015 -  Chemotherapy adjuvant chemotherapy: Adriamycin and Cytoxan dose dense 4 followed by Abraxane weekly 12    CHIEF COMPLIANT: Cycle 3 dose dense Adriamycin and Cytoxan  INTERVAL HISTORY: Jessica Petty is a 39 year old with above-mentioned history of right breast cancer currently on adjuvant chemotherapy because of high risk Oncotype DX score. Today is cycle 3 of chemotherapy. After cycle 2 she had a lot of muscle and bone pain accompanied by very severe fatigue that lasted 1-2 days. Subsequently she  felt better. She also has mouth sore on her left cheek.   REVIEW OF SYSTEMS:   Constitutional: Denies fevers, chills or abnormal weight loss, alopecia  Eyes: Denies blurriness of vision Ears, nose, mouth, throat, and facInside the left cheek there is a mild sore Respiratory: Denies cough, dyspnea or wheezes Cardiovascular: Denies palpitation, chest discomfort Gastrointestinal:  Denies nausea, heartburn or change in bowel habits Skin: Denies abnormal skin rashes Lymphatics: Denies new lymphadenopathy or easy bruising Neurological:Denies numbness, tingling or new weaknesses Behavioral/Psych: Mood is stable, no new changes  Extremities: No lower extremity edema All other systems were reviewed with the patient and are negative.  I have reviewed the past medical history, past surgical history, social history and family history with the patient and they are unchanged from previous note.  ALLERGIES:  has No Known Allergies.  MEDICATIONS:  Current Outpatient Prescriptions  Medication Sig Dispense Refill  . ADDERALL XR 20 MG 24 hr capsule Take 20 mg by mouth.  0  . ALPRAZolam (XANAX) 1 MG tablet Take 1 mg by mouth 3 (three) times daily as needed.  5  . ARIPiprazole (ABILIFY) 10 MG tablet Take 10 mg by mouth at bedtime.  11  . dexamethasone (DECADRON) 4 MG tablet Take 1 tablets by mouth once a day on the day after chemotherapy and then take 1 tablets two times a day for 2 days. Take with food. 30 tablet 1  . HYDROcodone-acetaminophen (NORCO) 10-325 MG tablet Take 1 tablet by mouth every 6 (six) hours as needed. 20 tablet 0  . lidocaine-prilocaine (EMLA) cream Apply to affected area once 30 g 3  . LORazepam (ATIVAN) 0.5 MG tablet Take  1 tablet (0.5 mg total) by mouth at bedtime. 30 tablet 0  . ondansetron (ZOFRAN) 8 MG tablet Take 1 tablet (8 mg total) by mouth 2 (two) times daily as needed. Start on the third day after chemotherapy. 30 tablet 1  . oxyCODONE-acetaminophen (PERCOCET) 10-325 MG  tablet Take 1 tablet by mouth every 6 (six) hours as needed for pain. 20 tablet 0  . prochlorperazine (COMPAZINE) 10 MG tablet Take 1 tablet (10 mg total) by mouth every 6 (six) hours as needed (Nausea or vomiting). 30 tablet 1  . Vitamin D, Ergocalciferol, (DRISDOL) 50000 UNITS CAPS capsule TAKE 1 CAPSULE BY MOUTH 2 TIMES WEEKLY AS DIRECTED  12   No current facility-administered medications for this visit.    PHYSICAL EXAMINATION: ECOG PERFORMANCE STATUS: 1 - Symptomatic but completely ambulatory  Filed Vitals:   12/07/15 1027  BP: 136/87  Pulse: 87  Temp: 97.7 F (36.5 C)  Resp: 18   Filed Weights   12/07/15 1027  Weight: 187 lb 6.4 oz (85.004 kg)    GENERAL:alert, no distress and comfortable SKIN: skin color, texture, turgor are normal, no rashes or significant lesions EYES: normal, Conjunctiva are pink and non-injected, sclera clear OROPHARYNX:no exudate, no erythema and lips, buccal mucosa, and tongue normal  NECK: supple, thyroid normal size, non-tender, without nodularity LYMPH:  no palpable lymphadenopathy in the cervical, axillary or inguinal LUNGS: clear to auscultation and percussion with normal breathing effort HEART: regular rate & rhythm and no murmurs and no lower extremity edema ABDOMEN:abdomen soft, non-tender and normal bowel sounds MUSCULOSKELETAL:no cyanosis of digits and no clubbing  NEURO: alert & oriented x 3 with fluent speech, no focal motor/sensory deficits EXTREMITIES: No lower extremity edema  LABORATORY DATA:  I have reviewed the data as listed   Chemistry      Component Value Date/Time   NA 138 11/23/2015 1140   K 4.0 11/23/2015 1140   CO2 26 11/23/2015 1140   BUN 8.1 11/23/2015 1140   CREATININE 0.8 11/23/2015 1140      Component Value Date/Time   CALCIUM 9.4 11/23/2015 1140   ALKPHOS 78 11/23/2015 1140   AST 16 11/23/2015 1140   ALT 21 11/23/2015 1140   BILITOT 0.31 11/23/2015 1140       Lab Results  Component Value Date   WBC  7.1 12/07/2015   HGB 12.3 12/07/2015   HCT 36.8 12/07/2015   MCV 81.0 12/07/2015   PLT 188 12/07/2015   NEUTROABS 5.6 12/07/2015   ASSESSMENT & PLAN:  Breast cancer of upper-inner quadrant of right female breast Right Lumpectomy: 09/14/2015 IDC Grade 2/3, 1.2 cm, LVI Present, 0/1 LN T1CN0 (Stage 1A) Oncotype DX 44, 30% ROR Oncotype DX counseling: I discussed the result of Oncotype DX scoring provided her with a copy of this report. Based upon her risk score of 44, with tamoxifen alone the risk of recurrence of 30%. This is very high risk and I recommended systemic chemotherapy.  Treatment plan: 1. Adjuvant chemotherapy with dose dense Adriamycin and Cytoxan every 2 weeks 4 followed by Abraxane weekly 12 2. Followed by radiation 3. Followed by antiestrogen therapy ------------------------------------------------------------------------------------------------------------------------------------------------------------ Current treatment: Cycle 3 day 1 dose dense Adriamycin and Cytoxan Chemotherapy monitoring: Echo 09/29/2015 EF 60-65%  Chemotoxicities: Denied any major side effects to chemotherapy. Denied any nausea vomiting. 1. Increased appetite from dexamethasone: I decreased the dexamethasone for the next cycle to 1 tablet daily for 2 days after chemotherapy 2. Neutropenia nadir count which is expected due to chemotherapy. 3.  Alopecia  4. Mouth sore: Advised the patient to use salt water gargles  5. Fatigue after chemotherapy that last for 2-3 days    Blood counts were reviewed from today and there were normal.  patient's boss apparently has been insistent that she get a PET/CT scan. I provided her literature from [facility] and guidelines as to why she does not need a PET/CT scan.   Monitoring closely for chemotherapy toxicities  Return to clinic in 2 week for cycle 4  No orders of the defined types were placed in this encounter.   The patient has a good understanding of the overall  plan. she agrees with it. she will call with any problems that may develop before the next visit here.   Rulon Eisenmenger, MD 12/07/2015

## 2015-12-07 NOTE — Progress Notes (Signed)
Blood return noted before, every 3 cc and after Adriamycin push.  

## 2015-12-07 NOTE — Telephone Encounter (Signed)
Appointments made and avs printed for patient °

## 2015-12-20 NOTE — Assessment & Plan Note (Signed)
Right Lumpectomy: 09/14/2015 IDC Grade 2/3, 1.2 cm, LVI Present, 0/1 LN T1CN0 (Stage 1A) Oncotype DX 44, 30% ROR Oncotype DX counseling: I discussed the result of Oncotype DX scoring provided her with a copy of this report. Based upon her risk score of 44, with tamoxifen alone the risk of recurrence of 30%. This is very high risk and I recommended systemic chemotherapy.  Treatment plan: 1. Adjuvant chemotherapy with dose dense Adriamycin and Cytoxan every 2 weeks 4 followed by Abraxane weekly 12 2. Followed by radiation 3. Followed by antiestrogen therapy ------------------------------------------------------------------------------------------------------------------------------------------------------------ Current treatment: Cycle 4 day 1 dose dense Adriamycin and Cytoxan Chemotherapy monitoring: Echo 09/29/2015 EF 60-65%  Chemotoxicities: Denied any major side effects to chemotherapy. Denied any nausea vomiting. 1. Increased appetite from dexamethasone: I decreased the dexamethasone for the next cycle to 1 tablet daily for 2 days after chemotherapy 2. Neutropenia nadir count which is expected due to chemotherapy. 3. Alopecia  4. Mouth sore: Advised the patient to use salt water gargles  5. Fatigue after chemotherapy that last for 2-3 days   Blood counts were reviewed from today and there were normal. patient's boss apparently has been insistent that she get a PET/CT scan. I provided her literature from [facility] and guidelines as to why she does not need a PET/CT scan.   Monitoring closely for chemotherapy toxicities  Return to clinic in 2 week for cycle 1 of Abraxane

## 2015-12-21 ENCOUNTER — Telehealth: Payer: Self-pay | Admitting: Hematology and Oncology

## 2015-12-21 ENCOUNTER — Other Ambulatory Visit (HOSPITAL_BASED_OUTPATIENT_CLINIC_OR_DEPARTMENT_OTHER): Payer: BLUE CROSS/BLUE SHIELD

## 2015-12-21 ENCOUNTER — Ambulatory Visit (HOSPITAL_BASED_OUTPATIENT_CLINIC_OR_DEPARTMENT_OTHER): Payer: BLUE CROSS/BLUE SHIELD | Admitting: Hematology and Oncology

## 2015-12-21 ENCOUNTER — Ambulatory Visit (HOSPITAL_BASED_OUTPATIENT_CLINIC_OR_DEPARTMENT_OTHER): Payer: BLUE CROSS/BLUE SHIELD

## 2015-12-21 ENCOUNTER — Encounter: Payer: Self-pay | Admitting: *Deleted

## 2015-12-21 ENCOUNTER — Encounter: Payer: Self-pay | Admitting: Hematology and Oncology

## 2015-12-21 VITALS — BP 126/83 | HR 98 | Temp 98.1°F | Resp 18 | Ht 61.0 in | Wt 190.7 lb

## 2015-12-21 DIAGNOSIS — Z5111 Encounter for antineoplastic chemotherapy: Secondary | ICD-10-CM | POA: Diagnosis not present

## 2015-12-21 DIAGNOSIS — C50211 Malignant neoplasm of upper-inner quadrant of right female breast: Secondary | ICD-10-CM

## 2015-12-21 DIAGNOSIS — Z5189 Encounter for other specified aftercare: Secondary | ICD-10-CM | POA: Diagnosis not present

## 2015-12-21 LAB — CBC WITH DIFFERENTIAL/PLATELET
BASO%: 0.9 % (ref 0.0–2.0)
BASOS ABS: 0.1 10*3/uL (ref 0.0–0.1)
EOS ABS: 0 10*3/uL (ref 0.0–0.5)
EOS%: 0 % (ref 0.0–7.0)
HEMATOCRIT: 34.8 % (ref 34.8–46.6)
HEMOGLOBIN: 11.8 g/dL (ref 11.6–15.9)
LYMPH%: 10.9 % — ABNORMAL LOW (ref 14.0–49.7)
MCH: 27.4 pg (ref 25.1–34.0)
MCHC: 33.9 g/dL (ref 31.5–36.0)
MCV: 80.8 fL (ref 79.5–101.0)
MONO#: 0.6 10*3/uL (ref 0.1–0.9)
MONO%: 6.9 % (ref 0.0–14.0)
NEUT#: 7.2 10*3/uL — ABNORMAL HIGH (ref 1.5–6.5)
NEUT%: 81.3 % — AB (ref 38.4–76.8)
Platelets: 206 10*3/uL (ref 145–400)
RBC: 4.3 10*6/uL (ref 3.70–5.45)
RDW: 14.6 % — ABNORMAL HIGH (ref 11.2–14.5)
WBC: 8.9 10*3/uL (ref 3.9–10.3)
lymph#: 1 10*3/uL (ref 0.9–3.3)

## 2015-12-21 LAB — COMPREHENSIVE METABOLIC PANEL
ALBUMIN: 3.7 g/dL (ref 3.5–5.0)
ALK PHOS: 86 U/L (ref 40–150)
ALT: 17 U/L (ref 0–55)
AST: 15 U/L (ref 5–34)
Anion Gap: 8 mEq/L (ref 3–11)
BUN: 7.8 mg/dL (ref 7.0–26.0)
CALCIUM: 9.5 mg/dL (ref 8.4–10.4)
CHLORIDE: 106 meq/L (ref 98–109)
CO2: 23 mEq/L (ref 22–29)
Creatinine: 0.7 mg/dL (ref 0.6–1.1)
Glucose: 131 mg/dl (ref 70–140)
POTASSIUM: 4.1 meq/L (ref 3.5–5.1)
SODIUM: 138 meq/L (ref 136–145)
Total Bilirubin: <0.3 mg/dL (ref 0.20–1.20)
Total Protein: 6.7 g/dL (ref 6.4–8.3)

## 2015-12-21 MED ORDER — HEPARIN SOD (PORK) LOCK FLUSH 100 UNIT/ML IV SOLN
500.0000 [IU] | Freq: Once | INTRAVENOUS | Status: AC | PRN
  Administered 2015-12-21: 500 [IU]
  Filled 2015-12-21: qty 5

## 2015-12-21 MED ORDER — DOXORUBICIN HCL CHEMO IV INJECTION 2 MG/ML
60.0000 mg/m2 | Freq: Once | INTRAVENOUS | Status: AC
  Administered 2015-12-21: 114 mg via INTRAVENOUS
  Filled 2015-12-21: qty 57

## 2015-12-21 MED ORDER — SODIUM CHLORIDE 0.9 % IV SOLN
Freq: Once | INTRAVENOUS | Status: AC
  Administered 2015-12-21: 13:00:00 via INTRAVENOUS
  Filled 2015-12-21: qty 5

## 2015-12-21 MED ORDER — SODIUM CHLORIDE 0.9 % IJ SOLN
10.0000 mL | INTRAMUSCULAR | Status: DC | PRN
  Administered 2015-12-21: 10 mL
  Filled 2015-12-21: qty 10

## 2015-12-21 MED ORDER — CYCLOPHOSPHAMIDE CHEMO INJECTION 1 GM
600.0000 mg/m2 | Freq: Once | INTRAMUSCULAR | Status: AC
  Administered 2015-12-21: 1140 mg via INTRAVENOUS
  Filled 2015-12-21: qty 57

## 2015-12-21 MED ORDER — SODIUM CHLORIDE 0.9 % IV SOLN
Freq: Once | INTRAVENOUS | Status: AC
  Administered 2015-12-21: 13:00:00 via INTRAVENOUS

## 2015-12-21 MED ORDER — PALONOSETRON HCL INJECTION 0.25 MG/5ML
0.2500 mg | Freq: Once | INTRAVENOUS | Status: AC
  Administered 2015-12-21: 0.25 mg via INTRAVENOUS

## 2015-12-21 MED ORDER — PEGFILGRASTIM 6 MG/0.6ML ~~LOC~~ PSKT
6.0000 mg | PREFILLED_SYRINGE | Freq: Once | SUBCUTANEOUS | Status: AC
  Administered 2015-12-21: 6 mg via SUBCUTANEOUS
  Filled 2015-12-21: qty 0.6

## 2015-12-21 MED ORDER — PALONOSETRON HCL INJECTION 0.25 MG/5ML
INTRAVENOUS | Status: AC
  Filled 2015-12-21: qty 5

## 2015-12-21 NOTE — Addendum Note (Signed)
Addended by: Prentiss Bells on: 12/21/2015 06:13 PM   Modules accepted: Medications

## 2015-12-21 NOTE — Progress Notes (Signed)
Patient Care Team: No Pcp Per Patient as PCP - General (General Practice) Brien Few, MD as Consulting Physician (Obstetrics and Gynecology) Rolm Bookbinder, MD as Consulting Physician (General Surgery) Nicholas Lose, MD as Consulting Physician (Hematology and Oncology) Arloa Koh, MD as Consulting Physician (Radiation Oncology) Rockwell Germany, RN as Registered Nurse Mauro Kaufmann, RN as Registered Nurse Sylvan Cheese, NP as Nurse Practitioner (Nurse Practitioner)  DIAGNOSIS: Breast cancer of upper-inner quadrant of right female breast [facility])   Staging form: Breast, AJCC 7th Edition     Clinical stage from 08/17/2015: Stage IA (T1c, N0, M0) - Unsigned       Staging comments: Staged at breast conference on 9.7.16    SUMMARY OF ONCOLOGIC HISTORY:   Breast cancer of upper-inner quadrant of right female breast ([place])   08/10/2015 Mammogram Right breast mass 1.2 cm irregular spiculated   08/11/2015 Initial Diagnosis 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, grade 2-3   08/29/2015 Procedure  genetic testing normal (Genes tested includeATM, BARD1, BRCA1, BRCA2, BRIP1, CDH1, CHEK2, FANCC, MLH1, MSH2, MSH6, NBN, PALB2, PMS2, PTEN, RAD51C, RAD51D, TP53, and XRCC2)   09/14/2015 Surgery Right Lumpectomy: IDC Grade 2/3, 1.2 cm, LVI Present, 0/1 LN T1CN0 (Stage 1A) Oncotype DX 44, 30% ROR   09/20/2015 Procedure genetic testing revealed no pathogenic mutations   11/09/2015 -  Chemotherapy adjuvant chemotherapy: Adriamycin and Cytoxan dose dense 4 followed by Abraxane weekly 12    CHIEF COMPLIANT: cycle 4 of dose dense Adriamycin Cytoxan  INTERVAL HISTORY: Jessica Petty is a 40 year old with above-mentioned history of right breast cancer currently on adjuvant chemotherapy with dose dense Adriamycin Cytoxan between cycle 4 of treatment. After cycle 3 patient had profound fatigue lasted 2-3 days as well as a burning discomfort in both of her feet. It also coincided with  the coldness. The year and it is unclear if it is related to the cold weather or due to chemotherapy. She was not even able to walk on her feet when this happened. She is now able to walk and the pain symptoms have resolved. She also felt more nauseated this time around. However she has not taken her medications especially Zofran or Compazine.  REVIEW OF SYSTEMS:   Constitutional: Denies fevers, chills or abnormal weight loss Eyes: Denies blurriness of vision Ears, nose, mouth, throat, and face: Denies mucositis or sore throat Respiratory: Denies cough, dyspnea or wheezes Cardiovascular: Denies palpitation, chest discomfort Gastrointestinal:  Denies nausea, heartburn or change in bowel habits Skin: Denies abnormal skin rashes Lymphatics: Denies new lymphadenopathy or easy bruising Neurological:Denies numbness, tingling or new weaknesses Behavioral/Psych: Mood is stable, no new changes  Extremities: burning sensation in the feet  All other systems were reviewed with the patient and are negative.  I have reviewed the past medical history, past surgical history, social history and family history with the patient and they are unchanged from previous note.  ALLERGIES:  has No Known Allergies.  MEDICATIONS:  Current Outpatient Prescriptions  Medication Sig Dispense Refill  . ADDERALL XR 20 MG 24 hr capsule Take 20 mg by mouth.  0  . ALPRAZolam (XANAX) 1 MG tablet Take 1 mg by mouth 3 (three) times daily as needed.  5  . ARIPiprazole (ABILIFY) 10 MG tablet Take 10 mg by mouth at bedtime.  11  . dexamethasone (DECADRON) 4 MG tablet Take 1 tablets by mouth once a day on the day after chemotherapy and then take 1 tablets two times a day for  2 days. Take with food. 30 tablet 1  . HYDROcodone-acetaminophen (NORCO) 10-325 MG tablet Take 1 tablet by mouth every 6 (six) hours as needed. 20 tablet 0  . lidocaine-prilocaine (EMLA) cream Apply to affected area once 30 g 3  . LORazepam (ATIVAN) 0.5 MG  tablet Take 1 tablet (0.5 mg total) by mouth at bedtime. 30 tablet 0  . ondansetron (ZOFRAN) 8 MG tablet Take 1 tablet (8 mg total) by mouth 2 (two) times daily as needed. Start on the third day after chemotherapy. 30 tablet 1  . oxyCODONE-acetaminophen (PERCOCET) 10-325 MG tablet Take 1 tablet by mouth every 6 (six) hours as needed for pain. 20 tablet 0  . prochlorperazine (COMPAZINE) 10 MG tablet Take 1 tablet (10 mg total) by mouth every 6 (six) hours as needed (Nausea or vomiting). 30 tablet 1  . Vitamin D, Ergocalciferol, (DRISDOL) 50000 UNITS CAPS capsule TAKE 1 CAPSULE BY MOUTH 2 TIMES WEEKLY AS DIRECTED  12   No current facility-administered medications for this visit.    PHYSICAL EXAMINATION: ECOG PERFORMANCE STATUS: 1 - Symptomatic but completely ambulatory  Filed Vitals:   12/21/15 1154  BP: 126/83  Pulse: 98  Temp: 98.1 F (36.7 C)  Resp: 18   Filed Weights   12/21/15 1154  Weight: 190 lb 11.2 oz (86.501 kg)    GENERAL:alert, no distress and comfortable SKIN: skin color, texture, turgor are normal, no rashes or significant lesions EYES: normal, Conjunctiva are pink and non-injected, sclera clear OROPHARYNX:no exudate, no erythema and lips, buccal mucosa, and tongue normal  NECK: supple, thyroid normal size, non-tender, without nodularity LYMPH:  no palpable lymphadenopathy in the cervical, axillary or inguinal LUNGS: clear to auscultation and percussion with normal breathing effort HEART: regular rate & rhythm and no murmurs and no lower extremity edema ABDOMEN:abdomen soft, non-tender and normal bowel sounds MUSCULOSKELETAL:no cyanosis of digits and no clubbing  NEURO: alert & oriented x 3 with fluent speech, no focal motor/sensory deficits EXTREMITIES: No lower extremity edema  LABORATORY DATA:  I have reviewed the data as listed   Chemistry      Component Value Date/Time   NA 138 12/21/2015 1114   K 4.1 12/21/2015 1114   CO2 23 12/21/2015 1114   BUN 7.8  12/21/2015 1114   CREATININE 0.7 12/21/2015 1114      Component Value Date/Time   CALCIUM 9.5 12/21/2015 1114   ALKPHOS 86 12/21/2015 1114   AST 15 12/21/2015 1114   ALT 17 12/21/2015 1114   BILITOT <0.30 12/21/2015 1114       Lab Results  Component Value Date   WBC 8.9 12/21/2015   HGB 11.8 12/21/2015   HCT 34.8 12/21/2015   MCV 80.8 12/21/2015   PLT 206 12/21/2015   NEUTROABS 7.2* 12/21/2015     ASSESSMENT & PLAN:  Breast cancer of upper-inner quadrant of right female breast Right Lumpectomy: 09/14/2015 IDC Grade 2/3, 1.2 cm, LVI Present, 0/1 LN T1CN0 (Stage 1A) Oncotype DX 44, 30% ROR Oncotype DX counseling: I discussed the result of Oncotype DX scoring provided her with a copy of this report. Based upon her risk score of 44, with tamoxifen alone the risk of recurrence of 30%. This is very high risk and I recommended systemic chemotherapy.  Treatment plan: 1. Adjuvant chemotherapy with dose dense Adriamycin and Cytoxan every 2 weeks 4 followed by Abraxane weekly 12 2. Followed by radiation 3. Followed by antiestrogen therapy ------------------------------------------------------------------------------------------------------------------------------------------------------------ Current treatment: Cycle 4 day 1 dose dense Adriamycin and Cytoxan  Chemotherapy monitoring: Echo 09/29/2015 EF 60-65%  Chemotoxicities: Denied any major side effects to chemotherapy. Denied any nausea vomiting. 1. Increased appetite from dexamethasone: I decreased the dexamethasone for the next cycle to 1 tablet daily for 2 days after chemotherapy 2. Neutropenia nadir count which is expected due to chemotherapy. 3. Alopecia  4. Mouth sore: Advised the patient to use salt water gargles  5. Fatigue after chemotherapy that last for 2-3 days  6. Burning in the feet: I suspect it may be because of the cold weather 7. Nausea: Instructed her to take antinausea medications as prescribed.  Blood  counts were reviewed from todaylovastatin. patient's boss apparently has been insistent that she get a PET/CT scan. I provided her literature from [facility] and guidelines as to why she does not need a PET/CT scan.   Monitoring closely for chemotherapy toxicities  Return to clinic in 2 week for cycle 1 of Abraxane   No orders of the defined types were placed in this encounter.   The patient has a good understanding of the overall plan. she agrees with it. she will call with any problems that may develop before the next visit here.   Rulon Eisenmenger, MD 12/21/2015

## 2015-12-21 NOTE — Patient Instructions (Signed)
[facility] Discharge Instructions for Patients Receiving Chemotherapy  Today you received the following chemotherapy agents Adriamycin and Cytoxan  To help prevent nausea and vomiting after your treatment, we encourage you to take your nausea medication Compazine 10 mg every 6 hours as needed   If you develop nausea and vomiting that is not controlled by your nausea medication, call the clinic.   BELOW ARE SYMPTOMS THAT SHOULD BE REPORTED IMMEDIATELY:  *FEVER GREATER THAN 100.5 F  *CHILLS WITH OR WITHOUT FEVER  NAUSEA AND VOMITING THAT IS NOT CONTROLLED WITH YOUR NAUSEA MEDICATION  *UNUSUAL SHORTNESS OF BREATH  *UNUSUAL BRUISING OR BLEEDING  TENDERNESS IN MOUTH AND THROAT WITH OR WITHOUT PRESENCE OF ULCERS  *URINARY PROBLEMS  *BOWEL PROBLEMS  UNUSUAL RASH Items with * indicate a potential emergency and should be followed up as soon as possible.  Feel free to call the clinic you have any questions or concerns. The clinic phone number is [phone].  Please show the CHEMO ALERT CARD at check-in to the Emergency Department and triage nurse.   

## 2015-12-21 NOTE — Telephone Encounter (Signed)
Appointments made and avs printed for patient °

## 2015-12-22 ENCOUNTER — Other Ambulatory Visit: Payer: Self-pay | Admitting: Hematology and Oncology

## 2016-01-03 NOTE — Assessment & Plan Note (Signed)
Right Lumpectomy: 09/14/2015 IDC Grade 2/3, 1.2 cm, LVI Present, 0/1 LN T1CN0 (Stage 1A) Oncotype DX 44, 30% ROR Oncotype DX counseling: I discussed the result of Oncotype DX scoring provided her with a copy of this report. Based upon her risk score of 44, with tamoxifen alone the risk of recurrence of 30%. This is very high risk and I recommended systemic chemotherapy.  Treatment plan: 1. Adjuvant chemotherapy with dose dense Adriamycin and Cytoxan every 2 weeks 4 followed by Abraxane weekly 12 2. Followed by radiation 3. Followed by antiestrogen therapy ------------------------------------------------------------------------------------------------------------------------------------------------------------ Current treatment: Completed Cycle 4 dose dense Adriamycin and Cytoxan , Today is cycle 1 Abraxane Chemotherapy monitoring: Echo 09/29/2015 EF 60-65%  Chemotoxicities: Denied any major side effects to chemotherapy. Denied any nausea vomiting. 1. Increased appetite from dexamethasone: I decreased the dexamethasone for the next cycle to 1 tablet daily for 2 days after chemotherapy 2. Neutropenia nadir count which is expected due to chemotherapy. 3. Alopecia  4. Mouth sore: Advised the patient to use salt water gargles  5. Fatigue after chemotherapy that last for 2-3 days  6. Burning in the feet: I suspect it may be because of the cold weather 7. Nausea: Instructed her to take antinausea medications as prescribed.  Blood counts were reviewed from today.  Monitoring closely for chemotherapy toxicities  Return to clinic in 2 week for cycle 3 of Abraxane

## 2016-01-04 ENCOUNTER — Ambulatory Visit (HOSPITAL_BASED_OUTPATIENT_CLINIC_OR_DEPARTMENT_OTHER): Payer: BLUE CROSS/BLUE SHIELD | Admitting: Hematology and Oncology

## 2016-01-04 ENCOUNTER — Telehealth: Payer: Self-pay | Admitting: Hematology and Oncology

## 2016-01-04 ENCOUNTER — Other Ambulatory Visit (HOSPITAL_BASED_OUTPATIENT_CLINIC_OR_DEPARTMENT_OTHER): Payer: BLUE CROSS/BLUE SHIELD

## 2016-01-04 ENCOUNTER — Encounter: Payer: Self-pay | Admitting: Hematology and Oncology

## 2016-01-04 ENCOUNTER — Ambulatory Visit (HOSPITAL_BASED_OUTPATIENT_CLINIC_OR_DEPARTMENT_OTHER): Payer: BLUE CROSS/BLUE SHIELD

## 2016-01-04 ENCOUNTER — Encounter: Payer: Self-pay | Admitting: *Deleted

## 2016-01-04 VITALS — BP 145/92 | HR 103 | Temp 98.3°F | Resp 18 | Ht 61.0 in | Wt 188.2 lb

## 2016-01-04 DIAGNOSIS — Z5111 Encounter for antineoplastic chemotherapy: Secondary | ICD-10-CM

## 2016-01-04 DIAGNOSIS — C50211 Malignant neoplasm of upper-inner quadrant of right female breast: Secondary | ICD-10-CM | POA: Diagnosis not present

## 2016-01-04 LAB — CBC WITH DIFFERENTIAL/PLATELET
BASO%: 0.3 % (ref 0.0–2.0)
Basophils Absolute: 0 10*3/uL (ref 0.0–0.1)
EOS%: 0 % (ref 0.0–7.0)
Eosinophils Absolute: 0 10*3/uL (ref 0.0–0.5)
HCT: 32 % — ABNORMAL LOW (ref 34.8–46.6)
HGB: 10.9 g/dL — ABNORMAL LOW (ref 11.6–15.9)
LYMPH%: 9.6 % — AB (ref 14.0–49.7)
MCH: 28 pg (ref 25.1–34.0)
MCHC: 34.1 g/dL (ref 31.5–36.0)
MCV: 82.3 fL (ref 79.5–101.0)
MONO#: 0.7 10*3/uL (ref 0.1–0.9)
MONO%: 7.9 % (ref 0.0–14.0)
NEUT%: 82.2 % — AB (ref 38.4–76.8)
NEUTROS ABS: 7.1 10*3/uL — AB (ref 1.5–6.5)
Platelets: 172 10*3/uL (ref 145–400)
RBC: 3.89 10*6/uL (ref 3.70–5.45)
RDW: 15.9 % — ABNORMAL HIGH (ref 11.2–14.5)
WBC: 8.7 10*3/uL (ref 3.9–10.3)
lymph#: 0.8 10*3/uL — ABNORMAL LOW (ref 0.9–3.3)

## 2016-01-04 LAB — COMPREHENSIVE METABOLIC PANEL
ALT: 20 U/L (ref 0–55)
ANION GAP: 9 meq/L (ref 3–11)
AST: 18 U/L (ref 5–34)
Albumin: 3.8 g/dL (ref 3.5–5.0)
Alkaline Phosphatase: 90 U/L (ref 40–150)
BILIRUBIN TOTAL: 0.43 mg/dL (ref 0.20–1.20)
BUN: 9.4 mg/dL (ref 7.0–26.0)
CHLORIDE: 105 meq/L (ref 98–109)
CO2: 25 meq/L (ref 22–29)
Calcium: 9.4 mg/dL (ref 8.4–10.4)
Creatinine: 0.8 mg/dL (ref 0.6–1.1)
GLUCOSE: 115 mg/dL (ref 70–140)
Potassium: 3.8 mEq/L (ref 3.5–5.1)
SODIUM: 139 meq/L (ref 136–145)
TOTAL PROTEIN: 6.6 g/dL (ref 6.4–8.3)

## 2016-01-04 MED ORDER — PACLITAXEL PROTEIN-BOUND CHEMO INJECTION 100 MG
80.0000 mg/m2 | Freq: Once | INTRAVENOUS | Status: AC
  Administered 2016-01-04: 150 mg via INTRAVENOUS
  Filled 2016-01-04: qty 30

## 2016-01-04 MED ORDER — LIDOCAINE-PRILOCAINE 2.5-2.5 % EX CREA: TOPICAL_CREAM | CUTANEOUS | Status: DC

## 2016-01-04 MED ORDER — HEPARIN SOD (PORK) LOCK FLUSH 100 UNIT/ML IV SOLN
500.0000 [IU] | Freq: Once | INTRAVENOUS | Status: AC | PRN
  Administered 2016-01-04: 500 [IU]
  Filled 2016-01-04: qty 5

## 2016-01-04 MED ORDER — PALONOSETRON HCL INJECTION 0.25 MG/5ML
INTRAVENOUS | Status: AC
  Filled 2016-01-04: qty 5

## 2016-01-04 MED ORDER — SODIUM CHLORIDE 0.9 % IV SOLN
Freq: Once | INTRAVENOUS | Status: AC
  Administered 2016-01-04: 16:00:00 via INTRAVENOUS

## 2016-01-04 MED ORDER — PALONOSETRON HCL INJECTION 0.25 MG/5ML
0.2500 mg | Freq: Once | INTRAVENOUS | Status: AC
  Administered 2016-01-04: 0.25 mg via INTRAVENOUS

## 2016-01-04 MED ORDER — SODIUM CHLORIDE 0.9 % IJ SOLN
10.0000 mL | INTRAMUSCULAR | Status: DC | PRN
  Administered 2016-01-04: 10 mL
  Filled 2016-01-04: qty 10

## 2016-01-04 NOTE — Progress Notes (Signed)
Patient Care Team: No Pcp Per Patient as PCP - General (General Practice) Brien Few, MD as Consulting Physician (Obstetrics and Gynecology) Rolm Bookbinder, MD as Consulting Physician (General Surgery) Nicholas Lose, MD as Consulting Physician (Hematology and Oncology) Arloa Koh, MD as Consulting Physician (Radiation Oncology) Rockwell Germany, RN as Registered Nurse Mauro Kaufmann, RN as Registered Nurse Sylvan Cheese, NP as Nurse Practitioner (Nurse Practitioner)  DIAGNOSIS: Breast cancer of upper-inner quadrant of right female breast [facility])   Staging form: Breast, AJCC 7th Edition     Clinical stage from 08/17/2015: Stage IA (T1c, N0, M0) - Unsigned       Staging comments: Staged at breast conference on 9.7.16  SUMMARY OF ONCOLOGIC HISTORY:   Breast cancer of upper-inner quadrant of right female breast ([place])   08/10/2015 Mammogram Right breast mass 1.2 cm irregular spiculated   08/11/2015 Initial Diagnosis 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, grade 2-3   08/29/2015 Procedure  genetic testing normal (Genes tested includeATM, BARD1, BRCA1, BRCA2, BRIP1, CDH1, CHEK2, FANCC, MLH1, MSH2, MSH6, NBN, PALB2, PMS2, PTEN, RAD51C, RAD51D, TP53, and XRCC2)   09/14/2015 Surgery Right Lumpectomy: IDC Grade 2/3, 1.2 cm, LVI Present, 0/1 LN T1CN0 (Stage 1A) Oncotype DX 44, 30% ROR   09/20/2015 Procedure genetic testing revealed no pathogenic mutations   11/09/2015 -  Chemotherapy adjuvant chemotherapy: Adriamycin and Cytoxan dose dense 4 followed by Abraxane weekly 12    CHIEF COMPLIANT: Adjuvant chemotherapy Abraxane week 1/12  INTERVAL HISTORY: Jessica Petty is a 40 year old with above-mentioned history right breast cancer currently and adjuvant chemotherapy. She finished 4 cycles of Adriamycin and Cytoxan. She had profound fatigue after the last few chemotherapy treatments to the point that she was sleeping almost 36 hours after each of these treatments. She  has recovered well from then and she has been back to her normal self. She continues to feel shortness of breath to severe exertion like climbing flight of stairs but able to work full time and be able to participate in her daughter's activities including her recent 17th birthday.  REVIEW OF SYSTEMS:   Constitutional: Denies fevers, chills or abnormal weight loss, severe fatigue Eyes: Denies blurriness of vision Ears, nose, mouth, throat, and face: Denies mucositis or sore throat Respiratory: Denies cough, dyspnea or wheezes Cardiovascular: Denies palpitation, chest discomfort Gastrointestinal:  Denies nausea, heartburn or change in bowel habits Skin: Denies abnormal skin rashes Lymphatics: Denies new lymphadenopathy or easy bruising Neurological:Denies numbness, tingling or new weaknesses Behavioral/Psych: Mood is stable, no new changes  Extremities: No lower extremity edema Breast:  denies any pain or lumps or nodules in either breasts All other systems were reviewed with the patient and are negative.  I have reviewed the past medical history, past surgical history, social history and family history with the patient and they are unchanged from previous note.  ALLERGIES:  has No Known Allergies.  MEDICATIONS:  Current Outpatient Prescriptions  Medication Sig Dispense Refill  . ADDERALL XR 20 MG 24 hr capsule Take 20 mg by mouth.  0  . ALPRAZolam (XANAX) 1 MG tablet Take 1 mg by mouth 3 (three) times daily as needed.  5  . ARIPiprazole (ABILIFY) 10 MG tablet Take 10 mg by mouth at bedtime.  11  . dexamethasone (DECADRON) 4 MG tablet Take 1 tablets by mouth once a day on the day after chemotherapy and then take 1 tablets two times a day for 2 days. Take with food. 30 tablet 1  .  HYDROcodone-acetaminophen (NORCO) 10-325 MG tablet Take 1 tablet by mouth every 6 (six) hours as needed. 20 tablet 0  . lidocaine-prilocaine (EMLA) cream Apply to affected area once 30 g 3  . LORazepam (ATIVAN)  0.5 MG tablet Take 1 tablet (0.5 mg total) by mouth at bedtime. 30 tablet 0  . ondansetron (ZOFRAN) 8 MG tablet Take 1 tablet (8 mg total) by mouth 2 (two) times daily as needed. Start on the third day after chemotherapy. 30 tablet 1  . oxyCODONE-acetaminophen (PERCOCET) 10-325 MG tablet Take 1 tablet by mouth every 6 (six) hours as needed for pain. 20 tablet 0  . prochlorperazine (COMPAZINE) 10 MG tablet Take 1 tablet (10 mg total) by mouth every 6 (six) hours as needed (Nausea or vomiting). 30 tablet 1  . Vitamin D, Ergocalciferol, (DRISDOL) 50000 UNITS CAPS capsule TAKE 1 CAPSULE BY MOUTH 2 TIMES WEEKLY AS DIRECTED  12   No current facility-administered medications for this visit.    PHYSICAL EXAMINATION: ECOG PERFORMANCE STATUS: 1 - Symptomatic but completely ambulatory  Filed Vitals:   01/04/16 1425 01/04/16 1426  BP: 139/95 145/92  Pulse: 103   Temp: 98.3 F (36.8 C)   Resp: 18    Filed Weights   01/04/16 1425  Weight: 188 lb 3.2 oz (85.367 kg)    GENERAL:alert, no distress and comfortable SKIN: skin color, texture, turgor are normal, no rashes or significant lesions EYES: normal, Conjunctiva are pink and non-injected, sclera clear OROPHARYNX:no exudate, no erythema and lips, buccal mucosa, and tongue normal  NECK: supple, thyroid normal size, non-tender, without nodularity LYMPH:  no palpable lymphadenopathy in the cervical, axillary or inguinal LUNGS: clear to auscultation and percussion with normal breathing effort HEART: regular rate & rhythm and no murmurs and no lower extremity edema ABDOMEN:abdomen soft, non-tender and normal bowel sounds MUSCULOSKELETAL:no cyanosis of digits and no clubbing  NEURO: alert & oriented x 3 with fluent speech, no focal motor/sensory deficits EXTREMITIES: No lower extremity edema  LABORATORY DATA:  I have reviewed the data as listed   Chemistry      Component Value Date/Time   NA 139 01/04/2016 1356   K 3.8 01/04/2016 1356   CO2  25 01/04/2016 1356   BUN 9.4 01/04/2016 1356   CREATININE 0.8 01/04/2016 1356      Component Value Date/Time   CALCIUM 9.4 01/04/2016 1356   ALKPHOS 90 01/04/2016 1356   AST 18 01/04/2016 1356   ALT 20 01/04/2016 1356   BILITOT 0.43 01/04/2016 1356       Lab Results  Component Value Date   WBC 8.7 01/04/2016   HGB 10.9* 01/04/2016   HCT 32.0* 01/04/2016   MCV 82.3 01/04/2016   PLT 172 01/04/2016   NEUTROABS 7.1* 01/04/2016     ASSESSMENT & PLAN:  Breast cancer of upper-inner quadrant of right female breast Right Lumpectomy: 09/14/2015 IDC Grade 2/3, 1.2 cm, LVI Present, 0/1 LN T1CN0 (Stage 1A) Oncotype DX 44, 30% ROR Oncotype DX counseling: I discussed the result of Oncotype DX scoring provided her with a copy of this report. Based upon her risk score of 44, with tamoxifen alone the risk of recurrence of 30%. This is very high risk and I recommended systemic chemotherapy.  Treatment plan: 1. Adjuvant chemotherapy with dose dense Adriamycin and Cytoxan every 2 weeks 4 followed by Abraxane weekly 12 2. Followed by radiation 3. Followed by antiestrogen therapy ------------------------------------------------------------------------------------------------------------------------------------------------------------ Current treatment: Completed Cycle 4 dose dense Adriamycin and Cytoxan , Today is cycle 1  Abraxane Chemotherapy monitoring: Echo 09/29/2015 EF 60-65%  Chemotoxicities: Denied any major side effects to chemotherapy. Denied any nausea vomiting. 1. Increased appetite from dexamethasone: I decreased the dexamethasone for the next cycle to 1 tablet daily for 2 days after chemotherapy 2. Neutropenia nadir count which is expected due to chemotherapy. 3. Alopecia  4. Mouth sore: Advised the patient to use salt water gargles  5. Fatigue after chemotherapy that last for 2-3 days  6. Burning in the feet: I suspect it may be because of the cold weather 7. Nausea:  Instructed her to take antinausea medications as prescribed.  Blood counts were reviewed from today.  Monitoring closely for chemotherapy toxicities  Return to clinic in 2 week for cycle 3 of Abraxane    No orders of the defined types were placed in this encounter.   The patient has a good understanding of the overall plan. she agrees with it. she will call with any problems that may develop before the next visit here.   Rulon Eisenmenger, MD 01/04/2016

## 2016-01-04 NOTE — Telephone Encounter (Signed)
Appointments made and avs will be printed in chemo  °

## 2016-01-04 NOTE — Patient Instructions (Signed)

## 2016-01-04 NOTE — Progress Notes (Signed)
Unable to get in to room prior to MD.  No assessment performed.

## 2016-01-11 ENCOUNTER — Other Ambulatory Visit (HOSPITAL_BASED_OUTPATIENT_CLINIC_OR_DEPARTMENT_OTHER): Payer: BLUE CROSS/BLUE SHIELD

## 2016-01-11 ENCOUNTER — Ambulatory Visit (HOSPITAL_BASED_OUTPATIENT_CLINIC_OR_DEPARTMENT_OTHER): Payer: BLUE CROSS/BLUE SHIELD

## 2016-01-11 VITALS — BP 126/70 | HR 92 | Temp 98.0°F | Resp 20

## 2016-01-11 DIAGNOSIS — C50211 Malignant neoplasm of upper-inner quadrant of right female breast: Secondary | ICD-10-CM | POA: Diagnosis not present

## 2016-01-11 DIAGNOSIS — Z5111 Encounter for antineoplastic chemotherapy: Secondary | ICD-10-CM

## 2016-01-11 LAB — COMPREHENSIVE METABOLIC PANEL
ALT: 41 U/L (ref 0–55)
AST: 25 U/L (ref 5–34)
Albumin: 3.7 g/dL (ref 3.5–5.0)
Alkaline Phosphatase: 60 U/L (ref 40–150)
Anion Gap: 8 mEq/L (ref 3–11)
BUN: 12.7 mg/dL (ref 7.0–26.0)
CALCIUM: 9.8 mg/dL (ref 8.4–10.4)
CHLORIDE: 107 meq/L (ref 98–109)
CO2: 25 mEq/L (ref 22–29)
Creatinine: 0.7 mg/dL (ref 0.6–1.1)
GLUCOSE: 104 mg/dL (ref 70–140)
POTASSIUM: 4.4 meq/L (ref 3.5–5.1)
SODIUM: 139 meq/L (ref 136–145)
Total Bilirubin: 0.37 mg/dL (ref 0.20–1.20)
Total Protein: 6.4 g/dL (ref 6.4–8.3)

## 2016-01-11 LAB — CBC WITH DIFFERENTIAL/PLATELET
BASO%: 1.4 % (ref 0.0–2.0)
Basophils Absolute: 0.1 10*3/uL (ref 0.0–0.1)
EOS ABS: 0 10*3/uL (ref 0.0–0.5)
EOS%: 0.2 % (ref 0.0–7.0)
HCT: 31.6 % — ABNORMAL LOW (ref 34.8–46.6)
HEMOGLOBIN: 10.9 g/dL — AB (ref 11.6–15.9)
LYMPH%: 14.3 % (ref 14.0–49.7)
MCH: 28.6 pg (ref 25.1–34.0)
MCHC: 34.5 g/dL (ref 31.5–36.0)
MCV: 82.7 fL (ref 79.5–101.0)
MONO#: 0.7 10*3/uL (ref 0.1–0.9)
MONO%: 12 % (ref 0.0–14.0)
NEUT%: 72.1 % (ref 38.4–76.8)
NEUTROS ABS: 4 10*3/uL (ref 1.5–6.5)
Platelets: 297 10*3/uL (ref 145–400)
RBC: 3.82 10*6/uL (ref 3.70–5.45)
RDW: 17 % — AB (ref 11.2–14.5)
WBC: 5.6 10*3/uL (ref 3.9–10.3)
lymph#: 0.8 10*3/uL — ABNORMAL LOW (ref 0.9–3.3)

## 2016-01-11 MED ORDER — PALONOSETRON HCL INJECTION 0.25 MG/5ML
INTRAVENOUS | Status: AC
  Filled 2016-01-11: qty 5

## 2016-01-11 MED ORDER — SODIUM CHLORIDE 0.9 % IJ SOLN
10.0000 mL | INTRAMUSCULAR | Status: DC | PRN
  Administered 2016-01-11: 10 mL
  Filled 2016-01-11: qty 10

## 2016-01-11 MED ORDER — HEPARIN SOD (PORK) LOCK FLUSH 100 UNIT/ML IV SOLN
500.0000 [IU] | Freq: Once | INTRAVENOUS | Status: AC | PRN
  Administered 2016-01-11: 500 [IU]
  Filled 2016-01-11: qty 5

## 2016-01-11 MED ORDER — PACLITAXEL PROTEIN-BOUND CHEMO INJECTION 100 MG
80.0000 mg/m2 | Freq: Once | INTRAVENOUS | Status: AC
  Administered 2016-01-11: 150 mg via INTRAVENOUS
  Filled 2016-01-11: qty 30

## 2016-01-11 MED ORDER — SODIUM CHLORIDE 0.9 % IV SOLN
Freq: Once | INTRAVENOUS | Status: AC
  Administered 2016-01-11: 09:00:00 via INTRAVENOUS

## 2016-01-11 MED ORDER — PALONOSETRON HCL INJECTION 0.25 MG/5ML
0.2500 mg | Freq: Once | INTRAVENOUS | Status: AC
  Administered 2016-01-11: 0.25 mg via INTRAVENOUS

## 2016-01-11 NOTE — Patient Instructions (Signed)

## 2016-01-17 NOTE — Assessment & Plan Note (Signed)
Right Lumpectomy: 09/14/2015 IDC Grade 2/3, 1.2 cm, LVI Present, 0/1 LN T1CN0 (Stage 1A) Oncotype DX 44, 30% ROR Oncotype DX counseling: I discussed the result of Oncotype DX scoring provided her with a copy of this report. Based upon her risk score of 44, with tamoxifen alone the risk of recurrence of 30%. This is very high risk and I recommended systemic chemotherapy.  Treatment plan: 1. Adjuvant chemotherapy with dose dense Adriamycin and Cytoxan every 2 weeks 4 followed by Abraxane weekly 12 2. Followed by radiation 3. Followed by antiestrogen therapy ------------------------------------------------------------------------------------------------------------------------------------------------------------ Current treatment: Completed Cycle 4 dose dense Adriamycin and Cytoxan , Today is cycle 3 Abraxane Chemotherapy monitoring: Echo 09/29/2015 EF 60-65%  Chemotoxicities: Denied any major side effects to chemotherapy. Denied any nausea vomiting. 1. Increased appetite from dexamethasone: I decreased the dexamethasone for the next cycle to 1 tablet daily for 2 days after chemotherapy 2. Neutropenia nadir count which is expected due to chemotherapy. 3. Alopecia  4. Mouth sore: Advised the patient to use salt water gargles  5. Fatigue after chemotherapy that last for 2-3 days  6. Burning in the feet: I suspect it may be because of the cold weather 7. Nausea: Instructed her to take antinausea medications as prescribed.  Blood counts were reviewed from today.  Monitoring closely for chemotherapy toxicities  Return to clinic in 2 week for cycle 5 of Abraxane

## 2016-01-18 ENCOUNTER — Other Ambulatory Visit (HOSPITAL_BASED_OUTPATIENT_CLINIC_OR_DEPARTMENT_OTHER): Payer: BLUE CROSS/BLUE SHIELD

## 2016-01-18 ENCOUNTER — Ambulatory Visit: Payer: BLUE CROSS/BLUE SHIELD

## 2016-01-18 ENCOUNTER — Encounter: Payer: Self-pay | Admitting: Hematology and Oncology

## 2016-01-18 ENCOUNTER — Other Ambulatory Visit: Payer: BLUE CROSS/BLUE SHIELD

## 2016-01-18 ENCOUNTER — Ambulatory Visit (HOSPITAL_BASED_OUTPATIENT_CLINIC_OR_DEPARTMENT_OTHER): Payer: BLUE CROSS/BLUE SHIELD

## 2016-01-18 ENCOUNTER — Encounter: Payer: Self-pay | Admitting: *Deleted

## 2016-01-18 ENCOUNTER — Ambulatory Visit (HOSPITAL_BASED_OUTPATIENT_CLINIC_OR_DEPARTMENT_OTHER): Payer: BLUE CROSS/BLUE SHIELD | Admitting: Hematology and Oncology

## 2016-01-18 VITALS — BP 138/80 | HR 102 | Temp 97.7°F | Resp 18 | Ht 61.0 in | Wt 189.8 lb

## 2016-01-18 DIAGNOSIS — D701 Agranulocytosis secondary to cancer chemotherapy: Secondary | ICD-10-CM | POA: Diagnosis not present

## 2016-01-18 DIAGNOSIS — Z5111 Encounter for antineoplastic chemotherapy: Secondary | ICD-10-CM | POA: Diagnosis not present

## 2016-01-18 DIAGNOSIS — C50211 Malignant neoplasm of upper-inner quadrant of right female breast: Secondary | ICD-10-CM

## 2016-01-18 DIAGNOSIS — Z170 Estrogen receptor positive status [ER+]: Secondary | ICD-10-CM | POA: Diagnosis not present

## 2016-01-18 DIAGNOSIS — T451X5A Adverse effect of antineoplastic and immunosuppressive drugs, initial encounter: Secondary | ICD-10-CM

## 2016-01-18 LAB — CBC WITH DIFFERENTIAL/PLATELET
BASO%: 1 % (ref 0.0–2.0)
Basophils Absolute: 0.1 10*3/uL (ref 0.0–0.1)
EOS ABS: 0 10*3/uL (ref 0.0–0.5)
EOS%: 0.4 % (ref 0.0–7.0)
HCT: 32.5 % — ABNORMAL LOW (ref 34.8–46.6)
HGB: 11.2 g/dL — ABNORMAL LOW (ref 11.6–15.9)
LYMPH%: 15.1 % (ref 14.0–49.7)
MCH: 28.8 pg (ref 25.1–34.0)
MCHC: 34.5 g/dL (ref 31.5–36.0)
MCV: 83.5 fL (ref 79.5–101.0)
MONO#: 0.6 10*3/uL (ref 0.1–0.9)
MONO%: 11.5 % (ref 0.0–14.0)
NEUT%: 72 % (ref 38.4–76.8)
NEUTROS ABS: 3.4 10*3/uL (ref 1.5–6.5)
Platelets: 242 10*3/uL (ref 145–400)
RBC: 3.89 10*6/uL (ref 3.70–5.45)
RDW: 16.4 % — ABNORMAL HIGH (ref 11.2–14.5)
WBC: 4.8 10*3/uL (ref 3.9–10.3)
lymph#: 0.7 10*3/uL — ABNORMAL LOW (ref 0.9–3.3)

## 2016-01-18 LAB — COMPREHENSIVE METABOLIC PANEL
ALT: 45 U/L (ref 0–55)
AST: 22 U/L (ref 5–34)
Albumin: 4 g/dL (ref 3.5–5.0)
Alkaline Phosphatase: 74 U/L (ref 40–150)
Anion Gap: 10 mEq/L (ref 3–11)
BUN: 10.4 mg/dL (ref 7.0–26.0)
CHLORIDE: 106 meq/L (ref 98–109)
CO2: 25 meq/L (ref 22–29)
CREATININE: 0.7 mg/dL (ref 0.6–1.1)
Calcium: 9.6 mg/dL (ref 8.4–10.4)
EGFR: >90 mL/min/{1.73_m2} (ref >90)
Glucose: 105 mg/dl (ref 70–140)
Potassium: 4 mEq/L (ref 3.5–5.1)
SODIUM: 141 meq/L (ref 136–145)
Total Bilirubin: 0.63 mg/dL (ref 0.20–1.20)
Total Protein: 6.7 g/dL (ref 6.4–8.3)

## 2016-01-18 MED ORDER — PALONOSETRON HCL INJECTION 0.25 MG/5ML
INTRAVENOUS | Status: AC
  Filled 2016-01-18: qty 5

## 2016-01-18 MED ORDER — SODIUM CHLORIDE 0.9 % IJ SOLN
10.0000 mL | INTRAMUSCULAR | Status: DC | PRN
  Administered 2016-01-18: 10 mL
  Filled 2016-01-18: qty 10

## 2016-01-18 MED ORDER — PACLITAXEL PROTEIN-BOUND CHEMO INJECTION 100 MG
80.0000 mg/m2 | Freq: Once | INTRAVENOUS | Status: AC
  Administered 2016-01-18: 150 mg via INTRAVENOUS
  Filled 2016-01-18: qty 30

## 2016-01-18 MED ORDER — PALONOSETRON HCL INJECTION 0.25 MG/5ML
0.2500 mg | Freq: Once | INTRAVENOUS | Status: AC
  Administered 2016-01-18: 0.25 mg via INTRAVENOUS

## 2016-01-18 MED ORDER — SODIUM CHLORIDE 0.9 % IV SOLN
Freq: Once | INTRAVENOUS | Status: AC
  Administered 2016-01-18: 13:00:00 via INTRAVENOUS

## 2016-01-18 MED ORDER — HEPARIN SOD (PORK) LOCK FLUSH 100 UNIT/ML IV SOLN
500.0000 [IU] | Freq: Once | INTRAVENOUS | Status: AC | PRN
  Administered 2016-01-18: 500 [IU]
  Filled 2016-01-18: qty 5

## 2016-01-18 NOTE — Patient Instructions (Signed)
[facility] Discharge Instructions for Patients Receiving Chemotherapy  Today you received the following chemotherapy agents Abraxane To help prevent nausea and vomiting after your treatment, we encourage you to take your nausea medication as prescribed.   If you develop nausea and vomiting that is not controlled by your nausea medication, call the clinic.   BELOW ARE SYMPTOMS THAT SHOULD BE REPORTED IMMEDIATELY:  *FEVER GREATER THAN 100.5 F  *CHILLS WITH OR WITHOUT FEVER  NAUSEA AND VOMITING THAT IS NOT CONTROLLED WITH YOUR NAUSEA MEDICATION  *UNUSUAL SHORTNESS OF BREATH  *UNUSUAL BRUISING OR BLEEDING  TENDERNESS IN MOUTH AND THROAT WITH OR WITHOUT PRESENCE OF ULCERS  *URINARY PROBLEMS  *BOWEL PROBLEMS  UNUSUAL RASH Items with * indicate a potential emergency and should be followed up as soon as possible.  Feel free to call the clinic you have any questions or concerns. The clinic phone number is [phone].  Please show the CHEMO ALERT CARD at check-in to the Emergency Department and triage nurse.   

## 2016-01-18 NOTE — Progress Notes (Signed)
Patient Care Team: No Pcp Per Patient as PCP - General (General Practice) Brien Few, MD as Consulting Physician (Obstetrics and Gynecology) Rolm Bookbinder, MD as Consulting Physician (General Surgery) Nicholas Lose, MD as Consulting Physician (Hematology and Oncology) Arloa Koh, MD as Consulting Physician (Radiation Oncology) Rockwell Germany, RN as Registered Nurse Mauro Kaufmann, RN as Registered Nurse Sylvan Cheese, NP as Nurse Practitioner (Nurse Practitioner)  DIAGNOSIS: Breast cancer of upper-inner quadrant of right female breast [facility])   Staging form: Breast, AJCC 7th Edition     Clinical stage from 08/17/2015: Stage IA (T1c, N0, M0) - Unsigned       Staging comments: Staged at breast conference on 9.7.16  SUMMARY OF ONCOLOGIC HISTORY:   Breast cancer of upper-inner quadrant of right female breast ([place])   08/10/2015 Mammogram Right breast mass 1.2 cm irregular spiculated   08/11/2015 Initial Diagnosis 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, grade 2-3   08/29/2015 Procedure  genetic testing normal (Genes tested includeATM, BARD1, BRCA1, BRCA2, BRIP1, CDH1, CHEK2, FANCC, MLH1, MSH2, MSH6, NBN, PALB2, PMS2, PTEN, RAD51C, RAD51D, TP53, and XRCC2)   09/14/2015 Surgery Right Lumpectomy: IDC Grade 2/3, 1.2 cm, LVI Present, 0/1 LN T1CN0 (Stage 1A) Oncotype DX 44, 30% ROR   09/20/2015 Procedure genetic testing revealed no pathogenic mutations   11/09/2015 -  Chemotherapy adjuvant chemotherapy: Adriamycin and Cytoxan dose dense 4 followed by Abraxane weekly 12    CHIEF COMPLIANT:  Cycle 3 of Abraxane  INTERVAL HISTORY: Jessica Petty is a  40 year old with above-mentioned history of right breast cancer treated with lumpectomy and is currently on adjuvant chemotherapy. Today cycle 3 of Abraxane. Overall she is tolerating Abraxane fairly well. She complains of chronic mild fatigue. This has not been limiting her activities. She denies any further issues  with nausea or vomiting. She  Does not have any further burning on her feet.  REVIEW OF SYSTEMS:   Constitutional: Denies fevers, chills or abnormal weight loss , fatigue Eyes: Denies blurriness of vision Ears, nose, mouth, throat, and face: Denies mucositis or sore throat Respiratory: Denies cough, dyspnea or wheezes Cardiovascular: Denies palpitation, chest discomfort Gastrointestinal:  Denies nausea, heartburn or change in bowel habits Skin: Denies abnormal skin rashes Lymphatics: Denies new lymphadenopathy or easy bruising Neurological:Denies numbness, tingling or new weaknesses Behavioral/Psych: Mood is stable, no new changes  Extremities: No lower extremity edema Breast:  denies any pain or lumps or nodules in either breasts All other systems were reviewed with the patient and are negative.  I have reviewed the past medical history, past surgical history, social history and family history with the patient and they are unchanged from previous note.  ALLERGIES:  has No Known Allergies.  MEDICATIONS:  Current Outpatient Prescriptions  Medication Sig Dispense Refill  . ADDERALL XR 20 MG 24 hr capsule Take 20 mg by mouth.  0  . ALPRAZolam (XANAX) 1 MG tablet Take 1 mg by mouth 3 (three) times daily as needed.  5  . ARIPiprazole (ABILIFY) 10 MG tablet Take 10 mg by mouth at bedtime.  11  . dexamethasone (DECADRON) 4 MG tablet Take 1 tablets by mouth once a day on the day after chemotherapy and then take 1 tablets two times a day for 2 days. Take with food. 30 tablet 1  . HYDROcodone-acetaminophen (NORCO) 10-325 MG tablet Take 1 tablet by mouth every 6 (six) hours as needed. 20 tablet 0  . lidocaine-prilocaine (EMLA) cream Apply to affected area once 30  g 3  . LORazepam (ATIVAN) 0.5 MG tablet Take 1 tablet (0.5 mg total) by mouth at bedtime. 30 tablet 0  . ondansetron (ZOFRAN) 8 MG tablet Take 1 tablet (8 mg total) by mouth 2 (two) times daily as needed. Start on the third day after  chemotherapy. 30 tablet 1  . oxyCODONE-acetaminophen (PERCOCET) 10-325 MG tablet Take 1 tablet by mouth every 6 (six) hours as needed for pain. 20 tablet 0  . prochlorperazine (COMPAZINE) 10 MG tablet Take 1 tablet (10 mg total) by mouth every 6 (six) hours as needed (Nausea or vomiting). 30 tablet 1  . Vitamin D, Ergocalciferol, (DRISDOL) 50000 UNITS CAPS capsule TAKE 1 CAPSULE BY MOUTH 2 TIMES WEEKLY AS DIRECTED  12   No current facility-administered medications for this visit.   Facility-Administered Medications Ordered in Other Visits  Medication Dose Route Frequency Provider Last Rate Last Dose  . heparin lock flush 100 unit/mL  500 Units Intracatheter Once PRN Nicholas Lose, MD      . PACLitaxel-protein bound (ABRAXANE) chemo infusion 150 mg  80 mg/m2 (Treatment Plan Actual) Intravenous Once Nicholas Lose, MD      . sodium chloride 0.9 % injection 10 mL  10 mL Intracatheter PRN Nicholas Lose, MD   10 mL at 01/11/16 1103  . sodium chloride 0.9 % injection 10 mL  10 mL Intracatheter PRN Nicholas Lose, MD        PHYSICAL EXAMINATION: ECOG PERFORMANCE STATUS: 1 - Symptomatic but completely ambulatory  Filed Vitals:   01/18/16 1105  BP: 138/80  Pulse: 102  Temp: 97.7 F (36.5 C)  Resp: 18   Filed Weights   01/18/16 1105  Weight: 189 lb 12.8 oz (86.093 kg)    GENERAL:alert, no distress and comfortable SKIN: skin color, texture, turgor are normal, no rashes or significant lesions EYES: normal, Conjunctiva are pink and non-injected, sclera clear OROPHARYNX:no exudate, no erythema and lips, buccal mucosa, and tongue normal  NECK: supple, thyroid normal size, non-tender, without nodularity LYMPH:  no palpable lymphadenopathy in the cervical, axillary or inguinal LUNGS: clear to auscultation and percussion with normal breathing effort HEART: regular rate & rhythm and no murmurs and no lower extremity edema ABDOMEN:abdomen soft, non-tender and normal bowel sounds MUSCULOSKELETAL:no  cyanosis of digits and no clubbing  NEURO: alert & oriented x 3 with fluent speech, no focal motor/sensory deficits EXTREMITIES: No lower extremity edema   LABORATORY DATA:  I have reviewed the data as listed   Chemistry      Component Value Date/Time   NA 141 01/18/2016 1054   K 4.0 01/18/2016 1054   CO2 25 01/18/2016 1054   BUN 10.4 01/18/2016 1054   CREATININE 0.7 01/18/2016 1054      Component Value Date/Time   CALCIUM 9.6 01/18/2016 1054   ALKPHOS 74 01/18/2016 1054   AST 22 01/18/2016 1054   ALT 45 01/18/2016 1054   BILITOT 0.63 01/18/2016 1054       Lab Results  Component Value Date   WBC 4.8 01/18/2016   HGB 11.2* 01/18/2016   HCT 32.5* 01/18/2016   MCV 83.5 01/18/2016   PLT 242 01/18/2016   NEUTROABS 3.4 01/18/2016     ASSESSMENT & PLAN:  Breast cancer of upper-inner quadrant of right female breast Right Lumpectomy: 09/14/2015 IDC Grade 2/3, 1.2 cm, LVI Present, 0/1 LN T1CN0 (Stage 1A) Oncotype DX 44, 30% ROR Oncotype DX counseling: I discussed the result of Oncotype DX scoring provided her with a copy of this report.  Based upon her risk score of 44, with tamoxifen alone the risk of recurrence of 30%. This is very high risk and I recommended systemic chemotherapy.  Treatment plan: 1. Adjuvant chemotherapy with dose dense Adriamycin and Cytoxan every 2 weeks 4 followed by Abraxane weekly 12 2. Followed by radiation 3. Followed by antiestrogen therapy ------------------------------------------------------------------------------------------------------------------------------------------------------------ Current treatment: Completed Cycle 4 dose dense Adriamycin and Cytoxan , Today is cycle 3 Abraxane Chemotherapy monitoring: Echo 09/29/2015 EF 60-65%  Chemotoxicities: Denied any major side effects to chemotherapy. Denied any nausea vomiting. 1. Increased appetite from dexamethasone: I decreased the dexamethasone for the next cycle to 1 tablet daily for 2  days after chemotherapy 2. Neutropenia nadir count which is expected due to chemotherapy. 3. Alopecia  4. Mouth sore: Advised the patient to use salt water gargles  5. Fatigue after chemotherapy that last for 2-3 days  6. Burning in the feet: I suspect it may be because of the cold weather 7. Nausea: Instructed her to take antinausea medications as prescribed.  Blood counts were reviewed from today.  Monitoring closely for chemotherapy toxicities  Return to clinic in 2 week for cycle 5 of Abraxane   No orders of the defined types were placed in this encounter.   The patient has a good understanding of the overall plan. she agrees with it. she will call with any problems that may develop before the next visit here.   Rulon Eisenmenger, MD 01/18/2016

## 2016-01-25 ENCOUNTER — Ambulatory Visit (HOSPITAL_BASED_OUTPATIENT_CLINIC_OR_DEPARTMENT_OTHER): Payer: BLUE CROSS/BLUE SHIELD

## 2016-01-25 ENCOUNTER — Other Ambulatory Visit (HOSPITAL_BASED_OUTPATIENT_CLINIC_OR_DEPARTMENT_OTHER): Payer: BLUE CROSS/BLUE SHIELD

## 2016-01-25 VITALS — BP 113/63 | HR 98 | Temp 98.3°F | Resp 18

## 2016-01-25 DIAGNOSIS — Z5111 Encounter for antineoplastic chemotherapy: Secondary | ICD-10-CM | POA: Diagnosis not present

## 2016-01-25 DIAGNOSIS — C50211 Malignant neoplasm of upper-inner quadrant of right female breast: Secondary | ICD-10-CM

## 2016-01-25 LAB — COMPREHENSIVE METABOLIC PANEL
ALT: 40 U/L (ref 0–55)
AST: 25 U/L (ref 5–34)
Albumin: 3.8 g/dL (ref 3.5–5.0)
Alkaline Phosphatase: 79 U/L (ref 40–150)
Anion Gap: 10 mEq/L (ref 3–11)
BILIRUBIN TOTAL: 0.6 mg/dL (ref 0.20–1.20)
BUN: 9.7 mg/dL (ref 7.0–26.0)
CO2: 26 meq/L (ref 22–29)
CREATININE: 0.8 mg/dL (ref 0.6–1.1)
Calcium: 9.5 mg/dL (ref 8.4–10.4)
Chloride: 104 mEq/L (ref 98–109)
EGFR: >90 mL/min/{1.73_m2} (ref >90)
GLUCOSE: 103 mg/dL (ref 70–140)
Potassium: 4.2 mEq/L (ref 3.5–5.1)
SODIUM: 140 meq/L (ref 136–145)
TOTAL PROTEIN: 6.6 g/dL (ref 6.4–8.3)

## 2016-01-25 LAB — CBC WITH DIFFERENTIAL/PLATELET
BASO%: 0.7 % (ref 0.0–2.0)
Basophils Absolute: 0 10*3/uL (ref 0.0–0.1)
EOS%: 3.1 % (ref 0.0–7.0)
Eosinophils Absolute: 0.2 10*3/uL (ref 0.0–0.5)
HCT: 33.6 % — ABNORMAL LOW (ref 34.8–46.6)
HGB: 11.5 g/dL — ABNORMAL LOW (ref 11.6–15.9)
LYMPH%: 16.9 % (ref 14.0–49.7)
MCH: 29.1 pg (ref 25.1–34.0)
MCHC: 34.2 g/dL (ref 31.5–36.0)
MCV: 85.1 fL (ref 79.5–101.0)
MONO#: 0.5 10*3/uL (ref 0.1–0.9)
MONO%: 9.2 % (ref 0.0–14.0)
NEUT%: 70.1 % (ref 38.4–76.8)
NEUTROS ABS: 3.8 10*3/uL (ref 1.5–6.5)
Platelets: 263 10*3/uL (ref 145–400)
RBC: 3.95 10*6/uL (ref 3.70–5.45)
RDW: 15.8 % — AB (ref 11.2–14.5)
WBC: 5.4 10*3/uL (ref 3.9–10.3)
lymph#: 0.9 10*3/uL (ref 0.9–3.3)

## 2016-01-25 MED ORDER — HEPARIN SOD (PORK) LOCK FLUSH 100 UNIT/ML IV SOLN
500.0000 [IU] | Freq: Once | INTRAVENOUS | Status: AC | PRN
  Administered 2016-01-25: 500 [IU]
  Filled 2016-01-25: qty 5

## 2016-01-25 MED ORDER — PALONOSETRON HCL INJECTION 0.25 MG/5ML
INTRAVENOUS | Status: AC
  Filled 2016-01-25: qty 5

## 2016-01-25 MED ORDER — SODIUM CHLORIDE 0.9 % IV SOLN
Freq: Once | INTRAVENOUS | Status: AC
  Administered 2016-01-25: 09:00:00 via INTRAVENOUS

## 2016-01-25 MED ORDER — PALONOSETRON HCL INJECTION 0.25 MG/5ML
0.2500 mg | Freq: Once | INTRAVENOUS | Status: AC
  Administered 2016-01-25: 0.25 mg via INTRAVENOUS

## 2016-01-25 MED ORDER — SODIUM CHLORIDE 0.9 % IJ SOLN
10.0000 mL | INTRAMUSCULAR | Status: DC | PRN
  Administered 2016-01-25: 10 mL
  Filled 2016-01-25: qty 10

## 2016-01-25 MED ORDER — PACLITAXEL PROTEIN-BOUND CHEMO INJECTION 100 MG
80.0000 mg/m2 | Freq: Once | INTRAVENOUS | Status: AC
Start: 2016-01-25 — End: 2016-01-25
  Administered 2016-01-25: 150 mg via INTRAVENOUS
  Filled 2016-01-25: qty 30

## 2016-01-25 NOTE — Patient Instructions (Signed)
[facility] Discharge Instructions for Patients Receiving Chemotherapy  Today you received the following chemotherapy agents Abraxane To help prevent nausea and vomiting after your treatment, we encourage you to take your nausea medication as prescribed.   If you develop nausea and vomiting that is not controlled by your nausea medication, call the clinic.   BELOW ARE SYMPTOMS THAT SHOULD BE REPORTED IMMEDIATELY:  *FEVER GREATER THAN 100.5 F  *CHILLS WITH OR WITHOUT FEVER  NAUSEA AND VOMITING THAT IS NOT CONTROLLED WITH YOUR NAUSEA MEDICATION  *UNUSUAL SHORTNESS OF BREATH  *UNUSUAL BRUISING OR BLEEDING  TENDERNESS IN MOUTH AND THROAT WITH OR WITHOUT PRESENCE OF ULCERS  *URINARY PROBLEMS  *BOWEL PROBLEMS  UNUSUAL RASH Items with * indicate a potential emergency and should be followed up as soon as possible.  Feel free to call the clinic you have any questions or concerns. The clinic phone number is [phone].  Please show the CHEMO ALERT CARD at check-in to the Emergency Department and triage nurse.   

## 2016-01-31 NOTE — Assessment & Plan Note (Signed)
Right Lumpectomy: 09/14/2015 IDC Grade 2/3, 1.2 cm, LVI Present, 0/1 LN T1CN0 (Stage 1A) Oncotype DX 44, 30% ROR Oncotype DX counseling: I discussed the result of Oncotype DX scoring provided her with a copy of this report. Based upon her risk score of 44, with tamoxifen alone the risk of recurrence of 30%. This is very high risk and I recommended systemic chemotherapy.  Treatment plan: 1. Adjuvant chemotherapy with dose dense Adriamycin and Cytoxan every 2 weeks 4 followed by Abraxane weekly 12 2. Followed by radiation 3. Followed by antiestrogen therapy ------------------------------------------------------------------------------------------------------------------------------------------------------------ Current treatment: Completed Cycle 4 dose dense Adriamycin and Cytoxan , Today is cycle 3 Abraxane Chemotherapy monitoring: Echo 09/29/2015 EF 60-65%  Chemotoxicities: Denied any major side effects to chemotherapy. Denied any nausea vomiting. 1. Increased appetite from dexamethasone: I decreased the dexamethasone for the next cycle to 1 tablet daily for 2 days after chemotherapy 2. Neutropenia nadir count which is expected due to chemotherapy. 3. Alopecia  4. Mouth sore: Advised the patient to use salt water gargles  5. Fatigue after chemotherapy that last for 2-3 days  6. Burning in the feet: I suspect it may be because of the cold weather 7. Nausea: Instructed her to take antinausea medications as prescribed.  Blood counts were reviewed from today.  Monitoring closely for chemotherapy toxicities  Return to clinic in 2 week for cycle 7 of Abraxane

## 2016-02-01 ENCOUNTER — Encounter: Payer: Self-pay | Admitting: Hematology and Oncology

## 2016-02-01 ENCOUNTER — Other Ambulatory Visit (HOSPITAL_BASED_OUTPATIENT_CLINIC_OR_DEPARTMENT_OTHER): Payer: BLUE CROSS/BLUE SHIELD

## 2016-02-01 ENCOUNTER — Telehealth: Payer: Self-pay | Admitting: Hematology and Oncology

## 2016-02-01 ENCOUNTER — Ambulatory Visit: Payer: BLUE CROSS/BLUE SHIELD

## 2016-02-01 ENCOUNTER — Ambulatory Visit (HOSPITAL_BASED_OUTPATIENT_CLINIC_OR_DEPARTMENT_OTHER): Payer: BLUE CROSS/BLUE SHIELD

## 2016-02-01 ENCOUNTER — Ambulatory Visit (HOSPITAL_BASED_OUTPATIENT_CLINIC_OR_DEPARTMENT_OTHER): Payer: BLUE CROSS/BLUE SHIELD | Admitting: Hematology and Oncology

## 2016-02-01 ENCOUNTER — Other Ambulatory Visit: Payer: BLUE CROSS/BLUE SHIELD

## 2016-02-01 VITALS — BP 126/72 | HR 93 | Temp 97.9°F | Resp 18 | Wt 193.7 lb

## 2016-02-01 DIAGNOSIS — C50211 Malignant neoplasm of upper-inner quadrant of right female breast: Secondary | ICD-10-CM

## 2016-02-01 DIAGNOSIS — D701 Agranulocytosis secondary to cancer chemotherapy: Secondary | ICD-10-CM

## 2016-02-01 DIAGNOSIS — Z452 Encounter for adjustment and management of vascular access device: Secondary | ICD-10-CM

## 2016-02-01 DIAGNOSIS — Z5111 Encounter for antineoplastic chemotherapy: Secondary | ICD-10-CM | POA: Diagnosis not present

## 2016-02-01 LAB — COMPREHENSIVE METABOLIC PANEL
ALT: 38 U/L (ref 0–55)
ANION GAP: 9 meq/L (ref 3–11)
AST: 21 U/L (ref 5–34)
Albumin: 3.9 g/dL (ref 3.5–5.0)
Alkaline Phosphatase: 71 U/L (ref 40–150)
BUN: 12.5 mg/dL (ref 7.0–26.0)
CALCIUM: 9.3 mg/dL (ref 8.4–10.4)
CHLORIDE: 105 meq/L (ref 98–109)
CO2: 27 meq/L (ref 22–29)
Creatinine: 0.7 mg/dL (ref 0.6–1.1)
Glucose: 103 mg/dl (ref 70–140)
POTASSIUM: 4.2 meq/L (ref 3.5–5.1)
Sodium: 141 mEq/L (ref 136–145)
Total Bilirubin: 0.57 mg/dL (ref 0.20–1.20)
Total Protein: 6.6 g/dL (ref 6.4–8.3)

## 2016-02-01 LAB — CBC WITH DIFFERENTIAL/PLATELET
BASO%: 1.3 % (ref 0.0–2.0)
BASOS ABS: 0.1 10*3/uL (ref 0.0–0.1)
EOS ABS: 0.1 10*3/uL (ref 0.0–0.5)
EOS%: 2.6 % (ref 0.0–7.0)
HEMATOCRIT: 34.8 % (ref 34.8–46.6)
HEMOGLOBIN: 11.5 g/dL — AB (ref 11.6–15.9)
LYMPH#: 0.9 10*3/uL (ref 0.9–3.3)
LYMPH%: 15.7 % (ref 14.0–49.7)
MCH: 28.4 pg (ref 25.1–34.0)
MCHC: 33 g/dL (ref 31.5–36.0)
MCV: 85.8 fL (ref 79.5–101.0)
MONO#: 0.4 10*3/uL (ref 0.1–0.9)
MONO%: 7.5 % (ref 0.0–14.0)
NEUT#: 3.9 10*3/uL (ref 1.5–6.5)
NEUT%: 72.9 % (ref 38.4–76.8)
Platelets: 247 10*3/uL (ref 145–400)
RBC: 4.05 10*6/uL (ref 3.70–5.45)
RDW: 16.3 % — AB (ref 11.2–14.5)
WBC: 5.4 10*3/uL (ref 3.9–10.3)

## 2016-02-01 MED ORDER — ALTEPLASE 2 MG IJ SOLR
2.0000 mg | Freq: Once | INTRAMUSCULAR | Status: AC | PRN
  Administered 2016-02-01: 2 mg
  Filled 2016-02-01: qty 2

## 2016-02-01 MED ORDER — PALONOSETRON HCL INJECTION 0.25 MG/5ML
INTRAVENOUS | Status: AC
  Filled 2016-02-01: qty 5

## 2016-02-01 MED ORDER — SODIUM CHLORIDE 0.9 % IJ SOLN
10.0000 mL | INTRAMUSCULAR | Status: DC | PRN
  Administered 2016-02-01: 10 mL
  Filled 2016-02-01: qty 10

## 2016-02-01 MED ORDER — PACLITAXEL PROTEIN-BOUND CHEMO INJECTION 100 MG
80.0000 mg/m2 | Freq: Once | INTRAVENOUS | Status: AC
  Administered 2016-02-01: 150 mg via INTRAVENOUS
  Filled 2016-02-01: qty 30

## 2016-02-01 MED ORDER — SODIUM CHLORIDE 0.9 % IV SOLN
Freq: Once | INTRAVENOUS | Status: AC
  Administered 2016-02-01: 11:00:00 via INTRAVENOUS

## 2016-02-01 MED ORDER — HEPARIN SOD (PORK) LOCK FLUSH 100 UNIT/ML IV SOLN
500.0000 [IU] | Freq: Once | INTRAVENOUS | Status: AC | PRN
  Administered 2016-02-01: 500 [IU]
  Filled 2016-02-01: qty 5

## 2016-02-01 MED ORDER — PALONOSETRON HCL INJECTION 0.25 MG/5ML
0.2500 mg | Freq: Once | INTRAVENOUS | Status: AC
  Administered 2016-02-01: 0.25 mg via INTRAVENOUS

## 2016-02-01 NOTE — Progress Notes (Signed)
TPA instilled at 922.  Checked for blood return at 945  Unable to get in to exam room prior to MD.  No assessment performed.

## 2016-02-01 NOTE — Progress Notes (Signed)
Patient Care Team: No Pcp Per Patient as PCP - General (General Practice) Brien Few, MD as Consulting Physician (Obstetrics and Gynecology) Rolm Bookbinder, MD as Consulting Physician (General Surgery) Nicholas Lose, MD as Consulting Physician (Hematology and Oncology) Arloa Koh, MD as Consulting Physician (Radiation Oncology) Rockwell Germany, RN as Registered Nurse Mauro Kaufmann, RN as Registered Nurse Sylvan Cheese, NP as Nurse Practitioner (Nurse Practitioner)  DIAGNOSIS: Breast cancer of upper-inner quadrant of right female breast [facility])   Staging form: Breast, AJCC 7th Edition     Clinical stage from 08/17/2015: Stage IA (T1c, N0, M0) - Unsigned       Staging comments: Staged at breast conference on 9.7.16  SUMMARY OF ONCOLOGIC HISTORY:   Breast cancer of upper-inner quadrant of right female breast ([place])   08/10/2015 Mammogram Right breast mass 1.2 cm irregular spiculated   08/11/2015 Initial Diagnosis 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, grade 2-3   08/29/2015 Procedure  genetic testing normal (Genes tested includeATM, BARD1, BRCA1, BRCA2, BRIP1, CDH1, CHEK2, FANCC, MLH1, MSH2, MSH6, NBN, PALB2, PMS2, PTEN, RAD51C, RAD51D, TP53, and XRCC2)   09/14/2015 Surgery Right Lumpectomy: IDC Grade 2/3, 1.2 cm, LVI Present, 0/1 LN T1CN0 (Stage 1A) Oncotype DX 44, 30% ROR   09/20/2015 Procedure genetic testing revealed no pathogenic mutations   11/09/2015 -  Chemotherapy adjuvant chemotherapy: Adriamycin and Cytoxan dose dense 4 followed by Abraxane weekly 12    CHIEF COMPLIANT: Cycle 5 of Abraxane  INTERVAL HISTORY: Jessica Petty is a 40 year old with above-mentioned history of right breast cancer currently on adjuvant chemotherapy with Abraxane. She reports more fatigue than previously. She denies any neuropathy. Denies any nausea or vomiting. Her face appears to be coming back. She tells me that her child is sick at home with flulike  symptoms.  REVIEW OF SYSTEMS:   Constitutional: Denies fevers, chills or abnormal weight loss Eyes: Denies blurriness of vision Ears, nose, mouth, throat, and face: Denies mucositis or sore throat Respiratory: Denies cough, dyspnea or wheezes Cardiovascular: Denies palpitation, chest discomfort Gastrointestinal:  Denies nausea, heartburn or change in bowel habits Skin: Denies abnormal skin rashes Lymphatics: Denies new lymphadenopathy or easy bruising Neurological:Denies numbness, tingling or new weaknesses Behavioral/Psych: Mood is stable, no new changes  Extremities: No lower extremity edema Breast:  denies any pain or lumps or nodules in either breasts All other systems were reviewed with the patient and are negative.  I have reviewed the past medical history, past surgical history, social history and family history with the patient and they are unchanged from previous note.  ALLERGIES:  has No Known Allergies.  MEDICATIONS:  Current Outpatient Prescriptions  Medication Sig Dispense Refill  . ADDERALL XR 20 MG 24 hr capsule Take 20 mg by mouth.  0  . ALPRAZolam (XANAX) 1 MG tablet Take 1 mg by mouth 3 (three) times daily as needed.  5  . ARIPiprazole (ABILIFY) 10 MG tablet Take 10 mg by mouth at bedtime.  11  . dexamethasone (DECADRON) 4 MG tablet Take 1 tablets by mouth once a day on the day after chemotherapy and then take 1 tablets two times a day for 2 days. Take with food. 30 tablet 1  . HYDROcodone-acetaminophen (NORCO) 10-325 MG tablet Take 1 tablet by mouth every 6 (six) hours as needed. 20 tablet 0  . lidocaine-prilocaine (EMLA) cream Apply to affected area once 30 g 3  . LORazepam (ATIVAN) 0.5 MG tablet Take 1 tablet (0.5 mg total) by mouth  at bedtime. 30 tablet 0  . ondansetron (ZOFRAN) 8 MG tablet Take 1 tablet (8 mg total) by mouth 2 (two) times daily as needed. Start on the third day after chemotherapy. 30 tablet 1  . oxyCODONE-acetaminophen (PERCOCET) 10-325 MG  tablet Take 1 tablet by mouth every 6 (six) hours as needed for pain. 20 tablet 0  . prochlorperazine (COMPAZINE) 10 MG tablet Take 1 tablet (10 mg total) by mouth every 6 (six) hours as needed (Nausea or vomiting). 30 tablet 1  . Vitamin D, Ergocalciferol, (DRISDOL) 50000 UNITS CAPS capsule TAKE 1 CAPSULE BY MOUTH 2 TIMES WEEKLY AS DIRECTED  12   No current facility-administered medications for this visit.   Facility-Administered Medications Ordered in Other Visits  Medication Dose Route Frequency Provider Last Rate Last Dose  . sodium chloride 0.9 % injection 10 mL  10 mL Intracatheter PRN Nicholas Lose, MD   10 mL at 01/11/16 1103    PHYSICAL EXAMINATION: ECOG PERFORMANCE STATUS: 1 - Symptomatic but completely ambulatory  Filed Vitals:   02/01/16 0926  BP: 126/72  Pulse: 93  Temp: 97.9 F (36.6 C)  Resp: 18   Filed Weights   02/01/16 0926  Weight: 193 lb 11.2 oz (87.862 kg)    GENERAL:alert, no distress and comfortable SKIN: skin color, texture, turgor are normal, no rashes or significant lesions EYES: normal, Conjunctiva are pink and non-injected, sclera clear OROPHARYNX:no exudate, no erythema and lips, buccal mucosa, and tongue normal  NECK: supple, thyroid normal size, non-tender, without nodularity LYMPH:  no palpable lymphadenopathy in the cervical, axillary or inguinal LUNGS: clear to auscultation and percussion with normal breathing effort HEART: regular rate & rhythm and no murmurs and no lower extremity edema ABDOMEN:abdomen soft, non-tender and normal bowel sounds MUSCULOSKELETAL:no cyanosis of digits and no clubbing  NEURO: alert & oriented x 3 with fluent speech, no focal motor/sensory deficits EXTREMITIES: No lower extremity edema  LABORATORY DATA:  I have reviewed the data as listed   Chemistry      Component Value Date/Time   NA 141 02/01/2016 0840   K 4.2 02/01/2016 0840   CO2 27 02/01/2016 0840   BUN 12.5 02/01/2016 0840   CREATININE 0.7 02/01/2016  0840      Component Value Date/Time   CALCIUM 9.3 02/01/2016 0840   ALKPHOS 71 02/01/2016 0840   AST 21 02/01/2016 0840   ALT 38 02/01/2016 0840   BILITOT 0.57 02/01/2016 0840       Lab Results  Component Value Date   WBC 5.4 02/01/2016   HGB 11.5* 02/01/2016   HCT 34.8 02/01/2016   MCV 85.8 02/01/2016   PLT 247 02/01/2016   NEUTROABS 3.9 02/01/2016   ASSESSMENT & PLAN:  Breast cancer of upper-inner quadrant of right female breast Right Lumpectomy: 09/14/2015 IDC Grade 2/3, 1.2 cm, LVI Present, 0/1 LN T1CN0 (Stage 1A) Oncotype DX 44, 30% ROR Oncotype DX counseling: I discussed the result of Oncotype DX scoring provided her with a copy of this report. Based upon her risk score of 44, with tamoxifen alone the risk of recurrence of 30%. This is very high risk and I recommended systemic chemotherapy.  Treatment plan: 1. Adjuvant chemotherapy with dose dense Adriamycin and Cytoxan every 2 weeks 4 followed by Abraxane weekly 12 2. Followed by radiation 3. Followed by antiestrogen therapy ---------------------------------------------------------------------------------------------------------------------------------------------------------- Current treatment: Completed Cycle 4 dose dense Adriamycin and Cytoxan , Today is cycle 5 as well however you in his care. We did not rigidity or cervical  Abraxane Chemotherapy monitoring: Echo 09/29/2015 EF 60-65%  Chemotoxicities: Denied any major side effects to chemotherapy. Denied any nausea vomiting. 1. Increased appetite from dexamethasone: I decreased the dexamethasone for the next cycle to 1 tablet daily for 2 days after chemotherapy 2. Neutropenia nadir count which is expected due to chemotherapy. 3. Alopecia  4. Mouth sore: Resolved  5. Fatigue after chemotherapy that last for 5-7 days  6. Burning in the feet: I suspect it may be because of the cold weather versus Abraxane-related neuropathy 7. Nausea: Instructed her to take  antinausea medications as prescribed.  Blood counts were reviewed from today.  Monitoring closely for chemotherapy toxicities  Return to clinic in 2 week for cycle 7 of Abraxane   No orders of the defined types were placed in this encounter.   The patient has a good understanding of the overall plan. she agrees with it. she will call with any problems that may develop before the next visit here.   Rulon Eisenmenger, MD 02/01/2016

## 2016-02-01 NOTE — Telephone Encounter (Signed)
appt made and avs printed °

## 2016-02-01 NOTE — Progress Notes (Signed)
Port A Cath accessed, flushed easily with no blood return.  Attempted several positions with flush.  Instilled Alteplase 0912 to assist in getting blood return.  Patient sent to lab to have labs drawn and then to see Dr Lindi Adie.

## 2016-02-08 ENCOUNTER — Other Ambulatory Visit (HOSPITAL_BASED_OUTPATIENT_CLINIC_OR_DEPARTMENT_OTHER): Payer: BLUE CROSS/BLUE SHIELD

## 2016-02-08 ENCOUNTER — Ambulatory Visit (HOSPITAL_BASED_OUTPATIENT_CLINIC_OR_DEPARTMENT_OTHER): Payer: BLUE CROSS/BLUE SHIELD

## 2016-02-08 VITALS — BP 119/79 | HR 95 | Temp 98.1°F | Resp 18

## 2016-02-08 DIAGNOSIS — C50211 Malignant neoplasm of upper-inner quadrant of right female breast: Secondary | ICD-10-CM

## 2016-02-08 DIAGNOSIS — Z5111 Encounter for antineoplastic chemotherapy: Secondary | ICD-10-CM

## 2016-02-08 LAB — COMPREHENSIVE METABOLIC PANEL
ALT: 39 U/L (ref 0–55)
AST: 27 U/L (ref 5–34)
Albumin: 3.8 g/dL (ref 3.5–5.0)
Alkaline Phosphatase: 72 U/L (ref 40–150)
Anion Gap: 8 mEq/L (ref 3–11)
BUN: 11.5 mg/dL (ref 7.0–26.0)
CALCIUM: 9.5 mg/dL (ref 8.4–10.4)
CHLORIDE: 105 meq/L (ref 98–109)
CO2: 27 mEq/L (ref 22–29)
CREATININE: 0.8 mg/dL (ref 0.6–1.1)
EGFR: >90 mL/min/{1.73_m2} (ref >90)
GLUCOSE: 92 mg/dL (ref 70–140)
POTASSIUM: 4.4 meq/L (ref 3.5–5.1)
SODIUM: 140 meq/L (ref 136–145)
Total Bilirubin: 0.4 mg/dL (ref 0.20–1.20)
Total Protein: 6.4 g/dL (ref 6.4–8.3)

## 2016-02-08 LAB — CBC WITH DIFFERENTIAL/PLATELET
BASO%: 1 % (ref 0.0–2.0)
BASOS ABS: 0 10*3/uL (ref 0.0–0.1)
EOS%: 2.5 % (ref 0.0–7.0)
Eosinophils Absolute: 0.1 10*3/uL (ref 0.0–0.5)
HEMATOCRIT: 33.5 % — AB (ref 34.8–46.6)
HEMOGLOBIN: 11.4 g/dL — AB (ref 11.6–15.9)
LYMPH#: 0.7 10*3/uL — AB (ref 0.9–3.3)
LYMPH%: 15.6 % (ref 14.0–49.7)
MCH: 29.5 pg (ref 25.1–34.0)
MCHC: 34 g/dL (ref 31.5–36.0)
MCV: 86.8 fL (ref 79.5–101.0)
MONO#: 0.4 10*3/uL (ref 0.1–0.9)
MONO%: 9.2 % (ref 0.0–14.0)
NEUT#: 3.1 10*3/uL (ref 1.5–6.5)
NEUT%: 71.7 % (ref 38.4–76.8)
Platelets: 246 10*3/uL (ref 145–400)
RBC: 3.85 10*6/uL (ref 3.70–5.45)
RDW: 15.3 % — AB (ref 11.2–14.5)
WBC: 4.3 10*3/uL (ref 3.9–10.3)

## 2016-02-08 MED ORDER — PALONOSETRON HCL INJECTION 0.25 MG/5ML
INTRAVENOUS | Status: AC
  Filled 2016-02-08: qty 5

## 2016-02-08 MED ORDER — PALONOSETRON HCL INJECTION 0.25 MG/5ML
0.2500 mg | Freq: Once | INTRAVENOUS | Status: AC
  Administered 2016-02-08: 0.25 mg via INTRAVENOUS

## 2016-02-08 MED ORDER — PACLITAXEL PROTEIN-BOUND CHEMO INJECTION 100 MG
80.0000 mg/m2 | Freq: Once | INTRAVENOUS | Status: AC
  Administered 2016-02-08: 150 mg via INTRAVENOUS
  Filled 2016-02-08: qty 30

## 2016-02-08 MED ORDER — SODIUM CHLORIDE 0.9 % IV SOLN
Freq: Once | INTRAVENOUS | Status: AC
  Administered 2016-02-08: 09:00:00 via INTRAVENOUS

## 2016-02-08 MED ORDER — HEPARIN SOD (PORK) LOCK FLUSH 100 UNIT/ML IV SOLN
500.0000 [IU] | Freq: Once | INTRAVENOUS | Status: AC | PRN
  Administered 2016-02-08: 500 [IU]
  Filled 2016-02-08: qty 5

## 2016-02-08 MED ORDER — SODIUM CHLORIDE 0.9 % IJ SOLN
10.0000 mL | INTRAMUSCULAR | Status: DC | PRN
  Administered 2016-02-08: 10 mL
  Filled 2016-02-08: qty 10

## 2016-02-08 NOTE — Patient Instructions (Signed)
[facility] Discharge Instructions for Patients Receiving Chemotherapy  Today you received the following chemotherapy agents Abraxane To help prevent nausea and vomiting after your treatment, we encourage you to take your nausea medication as prescribed.   If you develop nausea and vomiting that is not controlled by your nausea medication, call the clinic.   BELOW ARE SYMPTOMS THAT SHOULD BE REPORTED IMMEDIATELY:  *FEVER GREATER THAN 100.5 F  *CHILLS WITH OR WITHOUT FEVER  NAUSEA AND VOMITING THAT IS NOT CONTROLLED WITH YOUR NAUSEA MEDICATION  *UNUSUAL SHORTNESS OF BREATH  *UNUSUAL BRUISING OR BLEEDING  TENDERNESS IN MOUTH AND THROAT WITH OR WITHOUT PRESENCE OF ULCERS  *URINARY PROBLEMS  *BOWEL PROBLEMS  UNUSUAL RASH Items with * indicate a potential emergency and should be followed up as soon as possible.  Feel free to call the clinic you have any questions or concerns. The clinic phone number is [phone].  Please show the CHEMO ALERT CARD at check-in to the Emergency Department and triage nurse.   

## 2016-02-08 NOTE — Progress Notes (Signed)
Left at 1035

## 2016-02-14 ENCOUNTER — Encounter: Payer: Self-pay | Admitting: Hematology and Oncology

## 2016-02-14 NOTE — Assessment & Plan Note (Signed)
Right Lumpectomy: 09/14/2015 IDC Grade 2/3, 1.2 cm, LVI Present, 0/1 LN T1CN0 (Stage 1A) Oncotype DX 44, 30% ROR Oncotype DX counseling: I discussed the result of Oncotype DX scoring provided her with a copy of this report. Based upon her risk score of 44, with tamoxifen alone the risk of recurrence of 30%. This is very high risk and I recommended systemic chemotherapy.  Treatment plan: 1. Adjuvant chemotherapy with dose dense Adriamycin and Cytoxan every 2 weeks 4 followed by Abraxane weekly 12 2. Followed by radiation 3. Followed by antiestrogen therapy ---------------------------------------------------------------------------------------------------------------------------------------------------------- Current treatment: Completed Cycle 4 dose dense Adriamycin and Cytoxan , Today is cycle 7 Abraxane Chemotherapy monitoring: Echo 09/29/2015 EF 60-65%  Chemotoxicities: Denied any major side effects to chemotherapy. Denied any nausea vomiting. 1. Increased appetite from dexamethasone: I decreased the dexamethasone for the next cycle to 1 tablet daily for 2 days after chemotherapy 2. Neutropenia nadir count which is expected due to chemotherapy. 3. Alopecia  4. Mouth sore: Resolved  5. Fatigue after chemotherapy that last for 5-7 days  6. Burning in the feet: I suspect it may be because of the cold weather versus Abraxane-related neuropathy 7. Nausea: Instructed her to take antinausea medications as prescribed.  Blood counts were reviewed from today.  Monitoring closely for chemotherapy toxicities  Return to clinic in 2 week for cycle 9 of Abraxane

## 2016-02-15 ENCOUNTER — Ambulatory Visit: Payer: BLUE CROSS/BLUE SHIELD

## 2016-02-15 ENCOUNTER — Other Ambulatory Visit: Payer: BLUE CROSS/BLUE SHIELD

## 2016-02-15 ENCOUNTER — Other Ambulatory Visit: Payer: Self-pay

## 2016-02-15 ENCOUNTER — Telehealth: Payer: Self-pay | Admitting: Hematology and Oncology

## 2016-02-15 ENCOUNTER — Encounter: Payer: Self-pay | Admitting: *Deleted

## 2016-02-15 ENCOUNTER — Ambulatory Visit: Payer: BLUE CROSS/BLUE SHIELD | Admitting: Hematology and Oncology

## 2016-02-15 ENCOUNTER — Telehealth: Payer: Self-pay | Admitting: *Deleted

## 2016-02-15 NOTE — Telephone Encounter (Signed)
Left message for patient to inform her of r/s appt on 3/14 at 2 pm

## 2016-02-15 NOTE — Telephone Encounter (Signed)
Spoke with patient to check in since she cancelled her appointment today for chemo.  She states everyone at her house has the flu.  She was in the ED last night with her fiance who was sick and needed IVF's.  Encouraged her to call with any needs or concerns.  She is aware of her appointment next week 3/15.

## 2016-02-20 ENCOUNTER — Telehealth: Payer: Self-pay | Admitting: Hematology and Oncology

## 2016-02-20 NOTE — Telephone Encounter (Signed)
Moved 3/15 lab to 3/14 to coordinate with f/u. F/u was scheduled for 3/14 due to [facility] full 3/15. Treatments remains 3/15. Left message for patient re adding lab to 3/14 f/u and new time for 3/14 lab/fu at 1:30 pm.

## 2016-02-21 ENCOUNTER — Other Ambulatory Visit (HOSPITAL_BASED_OUTPATIENT_CLINIC_OR_DEPARTMENT_OTHER): Payer: BLUE CROSS/BLUE SHIELD

## 2016-02-21 ENCOUNTER — Ambulatory Visit (HOSPITAL_BASED_OUTPATIENT_CLINIC_OR_DEPARTMENT_OTHER): Payer: BLUE CROSS/BLUE SHIELD | Admitting: Hematology and Oncology

## 2016-02-21 ENCOUNTER — Telehealth: Payer: Self-pay | Admitting: Hematology and Oncology

## 2016-02-21 ENCOUNTER — Encounter: Payer: Self-pay | Admitting: Hematology and Oncology

## 2016-02-21 VITALS — BP 131/79 | HR 104 | Temp 97.5°F | Resp 18 | Ht 61.0 in | Wt 194.0 lb

## 2016-02-21 DIAGNOSIS — C50211 Malignant neoplasm of upper-inner quadrant of right female breast: Secondary | ICD-10-CM | POA: Diagnosis not present

## 2016-02-21 LAB — COMPREHENSIVE METABOLIC PANEL
ALBUMIN: 3.9 g/dL (ref 3.5–5.0)
ALK PHOS: 75 U/L (ref 40–150)
ALT: 33 U/L (ref 0–55)
ANION GAP: 9 meq/L (ref 3–11)
AST: 22 U/L (ref 5–34)
BUN: 7.6 mg/dL (ref 7.0–26.0)
CALCIUM: 9.5 mg/dL (ref 8.4–10.4)
CO2: 26 mEq/L (ref 22–29)
CREATININE: 0.8 mg/dL (ref 0.6–1.1)
Chloride: 105 mEq/L (ref 98–109)
EGFR: >90 mL/min/{1.73_m2} (ref >90)
Glucose: 101 mg/dl (ref 70–140)
POTASSIUM: 3.9 meq/L (ref 3.5–5.1)
Sodium: 140 mEq/L (ref 136–145)
Total Bilirubin: 0.63 mg/dL (ref 0.20–1.20)
Total Protein: 6.6 g/dL (ref 6.4–8.3)

## 2016-02-21 LAB — CBC WITH DIFFERENTIAL/PLATELET
BASO%: 0.6 % (ref 0.0–2.0)
BASOS ABS: 0 10*3/uL (ref 0.0–0.1)
EOS%: 1.4 % (ref 0.0–7.0)
Eosinophils Absolute: 0.1 10*3/uL (ref 0.0–0.5)
HEMATOCRIT: 36.2 % (ref 34.8–46.6)
HEMOGLOBIN: 12.2 g/dL (ref 11.6–15.9)
LYMPH#: 1 10*3/uL (ref 0.9–3.3)
LYMPH%: 15.7 % (ref 14.0–49.7)
MCH: 28.9 pg (ref 25.1–34.0)
MCHC: 33.8 g/dL (ref 31.5–36.0)
MCV: 85.6 fL (ref 79.5–101.0)
MONO#: 0.5 10*3/uL (ref 0.1–0.9)
MONO%: 8.6 % (ref 0.0–14.0)
NEUT#: 4.6 10*3/uL (ref 1.5–6.5)
NEUT%: 73.7 % (ref 38.4–76.8)
PLATELETS: 271 10*3/uL (ref 145–400)
RBC: 4.23 10*6/uL (ref 3.70–5.45)
RDW: 14 % (ref 11.2–14.5)
WBC: 6.2 10*3/uL (ref 3.9–10.3)

## 2016-02-21 NOTE — Telephone Encounter (Signed)
Gave patient avs report and appointments for March and April.  °

## 2016-02-21 NOTE — Progress Notes (Signed)
Patient Care Team: No Pcp Per Patient as PCP - General (General Practice) Brien Few, MD as Consulting Physician (Obstetrics and Gynecology) Rolm Bookbinder, MD as Consulting Physician (General Surgery) Nicholas Lose, MD as Consulting Physician (Hematology and Oncology) Arloa Koh, MD as Consulting Physician (Radiation Oncology) Rockwell Germany, RN as Registered Nurse Mauro Kaufmann, RN as Registered Nurse Sylvan Cheese, NP as Nurse Practitioner (Nurse Practitioner)  DIAGNOSIS: Breast cancer of upper-inner quadrant of right female breast [facility])   Staging form: Breast, AJCC 7th Edition     Clinical stage from 08/17/2015: Stage IA (T1c, N0, M0) - Unsigned       Staging comments: Staged at breast conference on 9.7.16  SUMMARY OF ONCOLOGIC HISTORY:   Breast cancer of upper-inner quadrant of right female breast ([place])   08/10/2015 Mammogram Right breast mass 1.2 cm irregular spiculated   08/11/2015 Initial Diagnosis 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, grade 2-3   08/29/2015 Procedure  genetic testing normal (Genes tested includeATM, BARD1, BRCA1, BRCA2, BRIP1, CDH1, CHEK2, FANCC, MLH1, MSH2, MSH6, NBN, PALB2, PMS2, PTEN, RAD51C, RAD51D, TP53, and XRCC2)   09/14/2015 Surgery Right Lumpectomy: IDC Grade 2/3, 1.2 cm, LVI Present, 0/1 LN T1CN0 (Stage 1A) Oncotype DX 44, 30% ROR   09/20/2015 Procedure genetic testing revealed no pathogenic mutations   11/09/2015 -  Chemotherapy adjuvant chemotherapy: Adriamycin and Cytoxan dose dense 4 followed by Abraxane weekly 12    CHIEF COMPLIANT:  Abraxane cycle 7  INTERVAL HISTORY: Jessica Petty is a  40 year old with above-mentioned history of right breast cancer was currently on adjuvant chemotherapy and is on weekly Abraxane. She missed last week's treatment because of flulike symptoms. She feels better today. She denies any nausea vomiting. Her taste is fully recovered. She does not have the abdominal pain or  diarrhea or constipation. Denies any fevers or chills.  REVIEW OF SYSTEMS:   Constitutional: Denies fevers, chills or abnormal weight loss Eyes: Denies blurriness of vision Ears, nose, mouth, throat, and face: Denies mucositis or sore throat Respiratory: Denies cough, dyspnea or wheezes Cardiovascular: Denies palpitation, chest discomfort Gastrointestinal:  Denies nausea, heartburn or change in bowel habits Skin: Denies abnormal skin rashes Lymphatics: Denies new lymphadenopathy or easy bruising Neurological:Denies numbness, tingling or new weaknesses Behavioral/Psych: Mood is stable, no new changes  Extremities: No lower extremity edema Breast:  denies any pain or lumps or nodules in either breasts All other systems were reviewed with the patient and are negative.  I have reviewed the past medical history, past surgical history, social history and family history with the patient and they are unchanged from previous note.  ALLERGIES:  has No Known Allergies.  MEDICATIONS:  Current Outpatient Prescriptions  Medication Sig Dispense Refill  . ADDERALL XR 20 MG 24 hr capsule Take 20 mg by mouth.  0  . ALPRAZolam (XANAX) 1 MG tablet Take 1 mg by mouth 3 (three) times daily as needed.  5  . ARIPiprazole (ABILIFY) 10 MG tablet Take 10 mg by mouth at bedtime.  11  . dexamethasone (DECADRON) 4 MG tablet Take 1 tablets by mouth once a day on the day after chemotherapy and then take 1 tablets two times a day for 2 days. Take with food. 30 tablet 1  . HYDROcodone-acetaminophen (NORCO) 10-325 MG tablet Take 1 tablet by mouth every 6 (six) hours as needed. 20 tablet 0  . lidocaine-prilocaine (EMLA) cream Apply to affected area once 30 g 3  . LORazepam (ATIVAN) 0.5 MG  tablet Take 1 tablet (0.5 mg total) by mouth at bedtime. 30 tablet 0  . ondansetron (ZOFRAN) 8 MG tablet Take 1 tablet (8 mg total) by mouth 2 (two) times daily as needed. Start on the third day after chemotherapy. 30 tablet 1  .  oxyCODONE-acetaminophen (PERCOCET) 10-325 MG tablet Take 1 tablet by mouth every 6 (six) hours as needed for pain. 20 tablet 0  . prochlorperazine (COMPAZINE) 10 MG tablet Take 1 tablet (10 mg total) by mouth every 6 (six) hours as needed (Nausea or vomiting). 30 tablet 1  . Vitamin D, Ergocalciferol, (DRISDOL) 50000 UNITS CAPS capsule TAKE 1 CAPSULE BY MOUTH 2 TIMES WEEKLY AS DIRECTED  12   No current facility-administered medications for this visit.   Facility-Administered Medications Ordered in Other Visits  Medication Dose Route Frequency Provider Last Rate Last Dose  . sodium chloride 0.9 % injection 10 mL  10 mL Intracatheter PRN Nicholas Lose, MD   10 mL at 01/11/16 1103    PHYSICAL EXAMINATION: ECOG PERFORMANCE STATUS: 1 - Symptomatic but completely ambulatory  Filed Vitals:   02/21/16 1401  BP: 131/79  Pulse: 104  Temp: 97.5 F (36.4 C)  Resp: 18   Filed Weights   02/21/16 1401  Weight: 194 lb (87.998 kg)    GENERAL:alert, no distress and comfortable SKIN: skin color, texture, turgor are normal, no rashes or significant lesions EYES: normal, Conjunctiva are pink and non-injected, sclera clear OROPHARYNX:no exudate, no erythema and lips, buccal mucosa, and tongue normal  NECK: supple, thyroid normal size, non-tender, without nodularity LYMPH:  no palpable lymphadenopathy in the cervical, axillary or inguinal LUNGS: clear to auscultation and percussion with normal breathing effort HEART: regular rate & rhythm and no murmurs and no lower extremity edema ABDOMEN:abdomen soft, non-tender and normal bowel sounds MUSCULOSKELETAL:no cyanosis of digits and no clubbing  NEURO: alert & oriented x 3 with fluent speech, no focal motor/sensory deficits EXTREMITIES: No lower extremity edema LABORATORY DATA:  I have reviewed the data as listed   Chemistry      Component Value Date/Time   NA 140 02/08/2016 0825   K 4.4 02/08/2016 0825   CO2 27 02/08/2016 0825   BUN 11.5  02/08/2016 0825   CREATININE 0.8 02/08/2016 0825      Component Value Date/Time   CALCIUM 9.5 02/08/2016 0825   ALKPHOS 72 02/08/2016 0825   AST 27 02/08/2016 0825   ALT 39 02/08/2016 0825   BILITOT 0.40 02/08/2016 0825       Lab Results  Component Value Date   WBC 6.2 02/21/2016   HGB 12.2 02/21/2016   HCT 36.2 02/21/2016   MCV 85.6 02/21/2016   PLT 271 02/21/2016   NEUTROABS 4.6 02/21/2016   ASSESSMENT & PLAN:  Breast cancer of upper-inner quadrant of right female breast Right Lumpectomy: 09/14/2015 IDC Grade 2/3, 1.2 cm, LVI Present, 0/1 LN T1CN0 (Stage 1A) Oncotype DX 44, 30% ROR Oncotype DX counseling: I discussed the result of Oncotype DX scoring provided her with a copy of this report. Based upon her risk score of 44, with tamoxifen alone the risk of recurrence of 30%. This is very high risk and I recommended systemic chemotherapy.  Treatment plan: 1. Adjuvant chemotherapy with dose dense Adriamycin and Cytoxan every 2 weeks 4 followed by Abraxane weekly 12 2. Followed by radiation 3. Followed by antiestrogen therapy ---------------------------------------------------------------------------------------------------------------------------------------------------------- Current treatment: Completed Cycle 4 dose dense Adriamycin and Cytoxan , Today is cycle 7 Abraxane Chemotherapy monitoring: Echo 09/29/2015 EF 60-65%  Chemotoxicities: Denied any major side effects to chemotherapy. Denied any nausea vomiting. 1. Increased appetite from dexamethasone: I decreased the dexamethasone for the next cycle to 1 tablet daily for 2 days after chemotherapy 2. Neutropenia nadir count which is expected due to chemotherapy. 3. Alopecia  4. Mouth sore: Resolved  5. Fatigue after chemotherapy that last for 5-7 days  6. Burning in the feet: I suspect it may be because of the cold weather versus Abraxane-related neuropathy 7. Nausea: Instructed her to take antinausea medications as  prescribed.  Blood counts were reviewed from today.  Anemia has resolved.  She will receive chemotherapy tomorrow 02/22/2016.  Monitoring closely for chemotherapy toxicities  Return to clinic in 2 week for cycle 9 of Abraxane   No orders of the defined types were placed in this encounter.   The patient has a good understanding of the overall plan. she agrees with it. she will call with any problems that may develop before the next visit here.   Rulon Eisenmenger, MD 02/21/2016

## 2016-02-21 NOTE — Assessment & Plan Note (Signed)
Right Lumpectomy: 09/14/2015 IDC Grade 2/3, 1.2 cm, LVI Present, 0/1 LN T1CN0 (Stage 1A) Oncotype DX 44, 30% ROR Oncotype DX counseling: I discussed the result of Oncotype DX scoring provided her with a copy of this report. Based upon her risk score of 44, with tamoxifen alone the risk of recurrence of 30%. This is very high risk and I recommended systemic chemotherapy.  Treatment plan: 1. Adjuvant chemotherapy with dose dense Adriamycin and Cytoxan every 2 weeks 4 followed by Abraxane weekly 12 2. Followed by radiation 3. Followed by antiestrogen therapy ---------------------------------------------------------------------------------------------------------------------------------------------------------- Current treatment: Completed Cycle 4 dose dense Adriamycin and Cytoxan , Today is cycle 7 Abraxane Chemotherapy monitoring: Echo 09/29/2015 EF 60-65%  Chemotoxicities: Denied any major side effects to chemotherapy. Denied any nausea vomiting. 1. Increased appetite from dexamethasone: I decreased the dexamethasone for the next cycle to 1 tablet daily for 2 days after chemotherapy 2. Neutropenia nadir count which is expected due to chemotherapy. 3. Alopecia  4. Mouth sore: Resolved  5. Fatigue after chemotherapy that last for 5-7 days  6. Burning in the feet: I suspect it may be because of the cold weather versus Abraxane-related neuropathy 7. Nausea: Instructed her to take antinausea medications as prescribed.  Blood counts were reviewed from today.  Anemia has resolved.  She will receive chemotherapy tomorrow 02/22/2016.  Monitoring closely for chemotherapy toxicities  Return to clinic in 2 week for cycle 9 of Abraxane

## 2016-02-22 ENCOUNTER — Encounter: Payer: Self-pay | Admitting: *Deleted

## 2016-02-22 ENCOUNTER — Ambulatory Visit (HOSPITAL_BASED_OUTPATIENT_CLINIC_OR_DEPARTMENT_OTHER): Payer: BLUE CROSS/BLUE SHIELD

## 2016-02-22 ENCOUNTER — Other Ambulatory Visit: Payer: BLUE CROSS/BLUE SHIELD

## 2016-02-22 VITALS — BP 139/75 | HR 92 | Temp 98.3°F | Resp 18

## 2016-02-22 DIAGNOSIS — Z5111 Encounter for antineoplastic chemotherapy: Secondary | ICD-10-CM

## 2016-02-22 DIAGNOSIS — C50211 Malignant neoplasm of upper-inner quadrant of right female breast: Secondary | ICD-10-CM

## 2016-02-22 MED ORDER — PALONOSETRON HCL INJECTION 0.25 MG/5ML
0.2500 mg | Freq: Once | INTRAVENOUS | Status: AC
  Administered 2016-02-22: 0.25 mg via INTRAVENOUS

## 2016-02-22 MED ORDER — PALONOSETRON HCL INJECTION 0.25 MG/5ML
INTRAVENOUS | Status: AC
Start: 2016-02-22 — End: 2016-02-22
  Filled 2016-02-22: qty 5

## 2016-02-22 MED ORDER — SODIUM CHLORIDE 0.9 % IV SOLN
Freq: Once | INTRAVENOUS | Status: AC
  Administered 2016-02-22: 09:00:00 via INTRAVENOUS

## 2016-02-22 MED ORDER — HEPARIN SOD (PORK) LOCK FLUSH 100 UNIT/ML IV SOLN
500.0000 [IU] | Freq: Once | INTRAVENOUS | Status: AC | PRN
  Administered 2016-02-22: 500 [IU]
  Filled 2016-02-22: qty 5

## 2016-02-22 MED ORDER — SODIUM CHLORIDE 0.9 % IJ SOLN
10.0000 mL | INTRAMUSCULAR | Status: DC | PRN
  Administered 2016-02-22: 10 mL
  Filled 2016-02-22: qty 10

## 2016-02-22 MED ORDER — PACLITAXEL PROTEIN-BOUND CHEMO INJECTION 100 MG
80.0000 mg/m2 | Freq: Once | INTRAVENOUS | Status: AC
  Administered 2016-02-22: 150 mg via INTRAVENOUS
  Filled 2016-02-22: qty 30

## 2016-02-22 NOTE — Patient Instructions (Signed)
[facility] Discharge Instructions for Patients Receiving Chemotherapy  Today you received the following chemotherapy agents Abraxane. To help prevent nausea and vomiting after your treatment, we encourage you to take your nausea medication as directed - NO ZOFRAN FOR 3 DAYS, TAKE YOUR COMPAZINE FOR NAUSEA.  If you develop nausea and vomiting that is not controlled by your nausea medication, call the clinic.   BELOW ARE SYMPTOMS THAT SHOULD BE REPORTED IMMEDIATELY:  *FEVER GREATER THAN 100.5 F  *CHILLS WITH OR WITHOUT FEVER  NAUSEA AND VOMITING THAT IS NOT CONTROLLED WITH YOUR NAUSEA MEDICATION  *UNUSUAL SHORTNESS OF BREATH  *UNUSUAL BRUISING OR BLEEDING  TENDERNESS IN MOUTH AND THROAT WITH OR WITHOUT PRESENCE OF ULCERS  *URINARY PROBLEMS  *BOWEL PROBLEMS  UNUSUAL RASH Items with * indicate a potential emergency and should be followed up as soon as possible.  Feel free to call the clinic you have any questions or concerns. The clinic phone number is [phone].  Please show the [place] at check-in to the Emergency Department and triage nurse.

## 2016-02-27 ENCOUNTER — Telehealth: Payer: Self-pay

## 2016-02-27 NOTE — Telephone Encounter (Signed)
Call reports received from [facility] 02/26/16 11:21 am 2:27 pm.    Writer called patient to follow up.  Patient reports she went to an urgent care where she was tested for strep and flu.  Strep came back negative.  Flu came back positive.  Pt was given azithromycin by urgent care MD.  She is taking oscillococcinum for flu (won't take tamiflu - reports it makes her really sick).  Pt denies any temps.  Writer advised pt to push fluids and rest and writer will check with Dr. Lindi Adie about chemo on 3/22 and call her back.  Pt voiced understanding.

## 2016-02-27 NOTE — Telephone Encounter (Signed)
-----   Message from Nicholas Lose, MD sent at 02/27/2016  7:24 AM EDT ----- Regarding: FW: please call patient Can one of you please call her and check to see if shes doing ok. Thanks VG ----- Message -----    From: Heath Lark, MD    Sent: 02/26/2016   2:40 PM      To: Nicholas Lose, MD Subject: please call patient                            Patient was at urgent care recently, diagnosed with imfluenza and placed of zithromax and tamiflu  She has mild low grade fever but since she is not neutropenic, I recommend she stays at home. Please follow

## 2016-02-27 NOTE — Telephone Encounter (Signed)
Let pt know Dr. Lindi Adie feels she can have chemo on the 22nd as long as she is not running a temperature.  Pt voiced understanding.

## 2016-02-29 ENCOUNTER — Ambulatory Visit: Payer: BLUE CROSS/BLUE SHIELD

## 2016-02-29 ENCOUNTER — Ambulatory Visit (HOSPITAL_COMMUNITY)
Admission: RE | Admit: 2016-02-29 | Discharge: 2016-02-29 | Disposition: A | Discharge status: Home / Self Care | Payer: BLUE CROSS/BLUE SHIELD | Source: Ambulatory Visit | Attending: Nurse Practitioner | Admitting: Nurse Practitioner

## 2016-02-29 ENCOUNTER — Ambulatory Visit (HOSPITAL_BASED_OUTPATIENT_CLINIC_OR_DEPARTMENT_OTHER): Payer: BLUE CROSS/BLUE SHIELD | Admitting: Nurse Practitioner

## 2016-02-29 ENCOUNTER — Other Ambulatory Visit (HOSPITAL_BASED_OUTPATIENT_CLINIC_OR_DEPARTMENT_OTHER): Payer: BLUE CROSS/BLUE SHIELD

## 2016-02-29 VITALS — BP 138/65 | HR 102 | Temp 98.5°F | Resp 18

## 2016-02-29 DIAGNOSIS — J111 Influenza due to unidentified influenza virus with other respiratory manifestations: Secondary | ICD-10-CM

## 2016-02-29 DIAGNOSIS — C50211 Malignant neoplasm of upper-inner quadrant of right female breast: Secondary | ICD-10-CM

## 2016-02-29 DIAGNOSIS — R059 Cough, unspecified: Secondary | ICD-10-CM

## 2016-02-29 DIAGNOSIS — R05 Cough: Secondary | ICD-10-CM

## 2016-02-29 LAB — CBC WITH DIFFERENTIAL/PLATELET
BASO%: 0.7 % (ref 0.0–2.0)
Basophils Absolute: 0 10*3/uL (ref 0.0–0.1)
EOS%: 0.8 % (ref 0.0–7.0)
Eosinophils Absolute: 0 10*3/uL (ref 0.0–0.5)
HEMATOCRIT: 36.1 % (ref 34.8–46.6)
HEMOGLOBIN: 12.2 g/dL (ref 11.6–15.9)
LYMPH#: 0.6 10*3/uL — AB (ref 0.9–3.3)
LYMPH%: 19.2 % (ref 14.0–49.7)
MCH: 28.6 pg (ref 25.1–34.0)
MCHC: 33.9 g/dL (ref 31.5–36.0)
MCV: 84.3 fL (ref 79.5–101.0)
MONO#: 0.3 10*3/uL (ref 0.1–0.9)
MONO%: 10 % (ref 0.0–14.0)
NEUT%: 69.3 % (ref 38.4–76.8)
NEUTROS ABS: 2 10*3/uL (ref 1.5–6.5)
PLATELETS: 230 10*3/uL (ref 145–400)
RBC: 4.28 10*6/uL (ref 3.70–5.45)
RDW: 13.7 % (ref 11.2–14.5)
WBC: 2.9 10*3/uL — AB (ref 3.9–10.3)

## 2016-02-29 LAB — COMPREHENSIVE METABOLIC PANEL
ALBUMIN: 3.6 g/dL (ref 3.5–5.0)
ALK PHOS: 63 U/L (ref 40–150)
ALT: 31 U/L (ref 0–55)
ANION GAP: 9 meq/L (ref 3–11)
AST: 25 U/L (ref 5–34)
BUN: 7.7 mg/dL (ref 7.0–26.0)
CALCIUM: 9.2 mg/dL (ref 8.4–10.4)
CHLORIDE: 103 meq/L (ref 98–109)
CO2: 25 mEq/L (ref 22–29)
CREATININE: 0.8 mg/dL (ref 0.6–1.1)
EGFR: >90 mL/min/{1.73_m2} (ref >90)
Glucose: 130 mg/dl (ref 70–140)
Potassium: 3.7 mEq/L (ref 3.5–5.1)
Sodium: 137 mEq/L (ref 136–145)
Total Bilirubin: 0.4 mg/dL (ref 0.20–1.20)
Total Protein: 6.6 g/dL (ref 6.4–8.3)

## 2016-02-29 MED ORDER — LEVOFLOXACIN 500 MG PO TABS: 500.0000 mg | ORAL_TABLET | Freq: Every day | ORAL | Status: DC

## 2016-02-29 NOTE — Progress Notes (Signed)
TC to pt- reviewed with Dr. Lindi Adie pt would like to travel to [place] for the [employer] next week. Per Dr. Lindi Adie if pt is feeling well enough to travel he advises she may go. Pt is aware.

## 2016-02-29 NOTE — Progress Notes (Signed)
Pt arrived to infusion room for chemo w/ mask on.  States started feeling "horrible" this past Friday.   Has ongoing cough, congestion and sore throat.  Denies  Known fever for several days.   Reports confirmed Flu on 3/19 at [facility].   Did not take Tamiflu because it causes nausea.  Has been taking a homeopathic remedy.  Was also started on Z pack although she was told she was negative for Strep.   Informed Dr. Lindi Adie pt still feels bad today w/ cough, congestion and sore throat.  He ordered to hold chemo today and pt to see Selena Lesser, NP.   Pt placed on [place] schedule and taken over to Private Exam room in [facility] for assessment.

## 2016-03-01 ENCOUNTER — Encounter: Payer: Self-pay | Admitting: Nurse Practitioner

## 2016-03-01 DIAGNOSIS — J111 Influenza due to unidentified influenza virus with other respiratory manifestations: Secondary | ICD-10-CM | POA: Insufficient documentation

## 2016-03-01 NOTE — Progress Notes (Signed)
[facility]   HPI: Jessica Petty 40 y.o. female diagnosed with breast cancer.  Currently undergoing Abraxane chemotherapy regimen.   Patient states she was diagnosed with influenza at a local urgent care this past weekend.  She was given Tamiflu and a Zithromax pack.  She states that she vomited when she took the Tamiflu; is not taking any more of the Tamiflu.  She continues with intermittent fevers, nasal congestion, and a very congested cough.  She is complaining of increased fatigue as well.  Exam today revealed.  Patient with increased nasal congestion, erythema to the posterior oropharynx, bilateral lungs essentially clear; with slight diminished basis and a very congested cough.  Vital signs were stable and patient was afebrile today.  Patient states she has completed 4 of the 5 days of Zithromax; and still has a congested cough.  Concern that patient may have developed a secondary bronchitis infection; and will prescribe Levaquin antibiotics.  Patient was advised to continue pushing fluids.  She was advised to call/return to go directly to the emergency department for any worsening symptoms whatsoever.   HPI  Review of Systems  Constitutional: Positive for fever, chills and malaise/fatigue.  HENT: Positive for congestion and sore throat.   Respiratory: Positive for cough. Negative for shortness of breath and wheezing.   All other systems reviewed and are negative.   Past Medical History  Diagnosis Date  . Breast cancer ([place])   . Anxiety   . ADHD (attention deficit hyperactivity disorder)     Past Surgical History  Procedure Laterality Date  . Wisdom tooth extraction    . Radioactive seed guided mastectomy with axillary sentinel lymph node biopsy Right 09/14/2015    Procedure: RADIOACTIVE SEED GUIDED PARTIAL MASTECTOMY WITH AXILLARY SENTINEL LYMPH NODE BIOPSY;  Surgeon: Rolm Bookbinder, MD;  Location: [facility];  Service: General;   Laterality: Right;  . Re-excision of breast lumpectomy Right 10/12/2015    Procedure: RE-EXCISION MARGIN RIGHT BREAST;  Surgeon: Rolm Bookbinder, MD;  Location: [facility];  Service: General;  Laterality: Right;  . Portacath placement N/A 10/12/2015    Procedure: INSERTION PORT-A-CATH WITH ULTRASOUND;  Surgeon: Rolm Bookbinder, MD;  Location: [facility];  Service: General;  Laterality: N/A;    has Breast cancer of upper-inner quadrant of right female breast ([place]); Family history of ovarian cancer; Genetic testing; and Influenza on her problem list.    is allergic to tamiflu.    Medication List       This list is accurate as of: 02/29/16 11:59 PM.  Always use your most recent med list.               ADDERALL XR 20 MG 24 hr capsule  Generic drug:  amphetamine-dextroamphetamine  Take 20 mg by mouth.     ALPRAZolam 1 MG tablet  Commonly known as:  XANAX  Take 1 mg by mouth 3 (three) times daily as needed.     ARIPiprazole 10 MG tablet  Commonly known as:  ABILIFY  Take 10 mg by mouth at bedtime.     azithromycin 500 MG tablet  Commonly known as:  ZITHROMAX  Take by mouth daily.     dexamethasone 4 MG tablet  Commonly known as:  DECADRON  Take 1 tablets by mouth once a day on the day after chemotherapy and then take 1 tablets two times a day for 2 days. Take with food.     HYDROcodone-acetaminophen 10-325 MG tablet  Commonly known as:  NORCO  Take 1 tablet by mouth every 6 (six) hours as needed.     levofloxacin 500 MG tablet  Commonly known as:  LEVAQUIN  Take 1 tablet (500 mg total) by mouth daily.     lidocaine-prilocaine cream  Commonly known as:  EMLA  Apply to affected area once     LORazepam 0.5 MG tablet  Commonly known as:  ATIVAN  Take 1 tablet (0.5 mg total) by mouth at bedtime.     ondansetron 8 MG tablet  Commonly known as:  ZOFRAN  Take 1 tablet (8 mg total) by mouth 2 (two) times daily as needed. Start on the third  day after chemotherapy.     oxyCODONE-acetaminophen 10-325 MG tablet  Commonly known as:  PERCOCET  Take 1 tablet by mouth every 6 (six) hours as needed for pain.     prochlorperazine 10 MG tablet  Commonly known as:  COMPAZINE  Take 1 tablet (10 mg total) by mouth every 6 (six) hours as needed (Nausea or vomiting).     Vitamin D (Ergocalciferol) 50000 units Caps capsule  Commonly known as:  DRISDOL  TAKE 1 CAPSULE BY MOUTH 2 TIMES WEEKLY AS DIRECTED         PHYSICAL EXAMINATION  Oncology Vitals 02/29/2016 02/29/2016  Height - -  Weight - -  Weight (lbs) - -  BMI (kg/m2) - -  Temp 98.5 98.5  Pulse 102 102  Resp 18 18  SpO2 100 100  BSA (m2) - -   BP Readings from Last 2 Encounters:  02/29/16 138/65  02/29/16 138/65    Physical Exam  Constitutional: She is oriented to person, place, and time and well-developed, well-nourished, and in no distress.  HENT:  Head: Normocephalic and atraumatic.  Nasal congestion noted.  Posterior oropharynx with erythema but no exudate.  Eyes: Conjunctivae and EOM are normal. Pupils are equal, round, and reactive to light. Right eye exhibits no discharge. Left eye exhibits no discharge. No scleral icterus.  Neck: Normal range of motion. Neck supple. No JVD present. No tracheal deviation present. No thyromegaly present.  Pulmonary/Chest: Effort normal. No respiratory distress. She has no wheezes. She has no rales. She exhibits no tenderness.  Patient with congested cough; but no obvious shortness of breath.  Abdominal: Soft. Bowel sounds are normal. She exhibits no distension and no mass. There is no tenderness. There is no rebound and no guarding.  Musculoskeletal: Normal range of motion. She exhibits no edema or tenderness.  Lymphadenopathy:    She has no cervical adenopathy.  Neurological: She is alert and oriented to person, place, and time. Gait normal.  Skin: Skin is warm and dry. No rash noted. No erythema. No pallor.  Psychiatric:  Affect normal.    LABORATORY DATA:. Appointment on 02/29/2016  Component Date Value Ref Range Status  . WBC 02/29/2016 2.9* 3.9 - 10.3 10e3/uL Final  . NEUT# 02/29/2016 2.0  1.5 - 6.5 10e3/uL Final  . HGB 02/29/2016 12.2  11.6 - 15.9 g/dL Final  . HCT 02/29/2016 36.1  34.8 - 46.6 % Final  . Platelets 02/29/2016 230  145 - 400 10e3/uL Final  . MCV 02/29/2016 84.3  79.5 - 101.0 fL Final  . MCH 02/29/2016 28.6  25.1 - 34.0 pg Final  . MCHC 02/29/2016 33.9  31.5 - 36.0 g/dL Final  . RBC 02/29/2016 4.28  3.70 - 5.45 10e6/uL Final  . RDW 02/29/2016 13.7  11.2 - 14.5 % Final  .  lymph# 02/29/2016 0.6* 0.9 - 3.3 10e3/uL Final  . MONO# 02/29/2016 0.3  0.1 - 0.9 10e3/uL Final  . Eosinophils Absolute 02/29/2016 0.0  0.0 - 0.5 10e3/uL Final  . Basophils Absolute 02/29/2016 0.0  0.0 - 0.1 10e3/uL Final  . NEUT% 02/29/2016 69.3  38.4 - 76.8 % Final  . LYMPH% 02/29/2016 19.2  14.0 - 49.7 % Final  . MONO% 02/29/2016 10.0  0.0 - 14.0 % Final  . EOS% 02/29/2016 0.8  0.0 - 7.0 % Final  . BASO% 02/29/2016 0.7  0.0 - 2.0 % Final  . Sodium 02/29/2016 137  136 - 145 mEq/L Final  . Potassium 02/29/2016 3.7  3.5 - 5.1 mEq/L Final  . Chloride 02/29/2016 103  98 - 109 mEq/L Final  . CO2 02/29/2016 25  22 - 29 mEq/L Final  . Glucose 02/29/2016 130  70 - 140 mg/dl Final   Glucose reference range is for nonfasting patients. Fasting glucose reference range is 70- 100.  Marland Kitchen BUN 02/29/2016 7.7  7.0 - 26.0 mg/dL Final  . Creatinine 02/29/2016 0.8  0.6 - 1.1 mg/dL Final  . Total Bilirubin 02/29/2016 0.40  0.20 - 1.20 mg/dL Final  . Alkaline Phosphatase 02/29/2016 63  40 - 150 U/L Final  . AST 02/29/2016 25  5 - 34 U/L Final  . ALT 02/29/2016 31  0 - 55 U/L Final  . Total Protein 02/29/2016 6.6  6.4 - 8.3 g/dL Final  . Albumin 02/29/2016 3.6  3.5 - 5.0 g/dL Final  . Calcium 02/29/2016 9.2  8.4 - 10.4 mg/dL Final  . Anion Gap 02/29/2016 9  3 - 11 mEq/L Final  . EGFR 02/29/2016 >90  >90 ml/min/1.73 m2 Final   eGFR  is calculated using the CKD-EPI Creatinine Equation (2009)     RADIOGRAPHIC STUDIES: Dg Chest 2 View  02/29/2016  CLINICAL DATA:  Cough for 4 days, history of breast carcinoma EXAM: CHEST  2 VIEW COMPARISON:  10/12/2015 FINDINGS: Cardiac shadow is within normal limits. Right chest wall port is again seen and stable. The lungs are well aerated bilaterally. No focal infiltrate or sizable effusion is seen. No bony abnormality is noted. IMPRESSION: No acute abnormality noted. Electronically Signed   By: Inez Catalina M.D.   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  However, patient was diagnosed with influenza this past weekend; and continues to feel very poorly.  See further notes for details.  Will hold chemotherapy today; and patient will return on 03/07/2016 for labs, visit, and chemotherapy.  Note: Patient asked advice regarding a planned trip for next Thursday, 03/08/2016.  She has chemotherapy scheduled for 03/07/2016.  Patient states that her employer gifted her with a trip to [place] for the final 4 in [employer] basketball games.  She has a hotel room a flight in the games [place] prepaid.  Reviewed this request with Dr. Lindi Adie; and he confirmed that patient should attempt to make this trip if she feels well enough.    Influenza Patient states she was diagnosed with influenza at a local urgent care this past weekend.  She was given Tamiflu and a Zithromax pack.  She states that she vomited when she took the Tamiflu; is not taking any more of the Tamiflu.  She continues with intermittent fevers, nasal congestion, and a very congested cough.  She  is complaining of increased fatigue as well.  Exam today revealed.  Patient with increased nasal congestion, erythema to the posterior oropharynx, bilateral lungs essentially clear; with slight  diminished basis and a very congested cough.  Vital signs were stable and patient was afebrile today.  Chest x-ray obtained today revealed no acute abnormalities.  Patient states she has completed 4 of the 5 days of Zithromax; and still has a congested cough.  Concern that patient may have developed a secondary bronchitis infection; and will prescribe Levaquin antibiotics.  Patient was advised to continue pushing fluids.  She was advised to call/return to go directly to the emergency department for any worsening symptoms whatsoever.     Patient stated understanding of all instructions; and was in agreement with this plan of care. The patient knows to call the clinic with any problems, questions or concerns.   Review/collaboration with Dr. Lindi Adie regarding all aspects of patient's visit today.   Total time spent with patient was 25 minutes;  with greater than 75 percent of that time spent in face to face counseling regarding patient's symptoms,  and coordination of care and follow up.  Disclaimer:This dictation was prepared with Dragon/digital dictation along with [employer]. Any transcriptional errors that result from this process are unintentional.  Drue Second, NP 03/01/2016

## 2016-03-01 NOTE — Assessment & Plan Note (Addendum)
Patient states she was diagnosed with influenza at a local urgent care this past weekend.  She was given Tamiflu and a Zithromax pack.  She states that she vomited when she took the Tamiflu; is not taking any more of the Tamiflu.  She continues with intermittent fevers, nasal congestion, and a very congested cough.  She is complaining of increased fatigue as well.  Exam today revealed.  Patient with increased nasal congestion, erythema to the posterior oropharynx, bilateral lungs essentially clear; with slight diminished basis and a very congested cough.  Vital signs were stable and patient was afebrile today.  Chest x-ray obtained today revealed no acute abnormalities.  Patient states she has completed 4 of the 5 days of Zithromax; and still has a congested cough.  Concern that patient may have developed a secondary bronchitis infection; and will prescribe Levaquin antibiotics.  Patient was advised to continue pushing fluids.  She was advised to call/return to go directly to the emergency department for any worsening symptoms whatsoever.

## 2016-03-01 NOTE — Assessment & Plan Note (Addendum)
Patient presented to the [facility] today to receive her next cycle of Abraxane chemotherapy.  However, patient was diagnosed with influenza this past weekend; and continues to feel very poorly.  See further notes for details.  Will hold chemotherapy today; and patient will return on 03/07/2016 for labs, visit, and chemotherapy.  Note: Patient asked advice regarding a planned trip for next Thursday, 03/08/2016.  She has chemotherapy scheduled for 03/07/2016.  Patient states that her employer gifted her with a trip to [place] for the final 4 in [place] basketball games.  She has a hotel room a flight in the [place] prepaid.  Reviewed this request with Dr. Lindi Adie; and he confirmed that patient should attempt to make this trip if she feels well enough.

## 2016-03-06 NOTE — Assessment & Plan Note (Signed)
Right Lumpectomy: 09/14/2015 IDC Grade 2/3, 1.2 cm, LVI Present, 0/1 LN T1CN0 (Stage 1A) Oncotype DX 44, 30% ROR Oncotype DX counseling: I discussed the result of Oncotype DX scoring provided her with a copy of this report. Based upon her risk score of 44, with tamoxifen alone the risk of recurrence of 30%. This is very high risk and I recommended systemic chemotherapy.  Treatment plan: 1. Adjuvant chemotherapy with dose dense Adriamycin and Cytoxan every 2 weeks 4 followed by Abraxane weekly 12 2. Followed by radiation 3. Followed by antiestrogen therapy ---------------------------------------------------------------------------------------------------------------------------------------------------------- Current treatment: Completed Cycle 4 dose dense Adriamycin and Cytoxan , Today is cycle 9 Abraxane Chemotherapy monitoring: Echo 09/29/2015 EF 60-65%  Chemotoxicities: Denied any major side effects to chemotherapy. Denied any nausea vomiting. 1. Increased appetite from dexamethasone: I decreased the dexamethasone for the next cycle to 1 tablet daily for 2 days after chemotherapy 2. Neutropenia nadir count which is expected due to chemotherapy. 3. Alopecia  4. Mouth sore: Resolved  5. Fatigue after chemotherapy that last for 5-7 days  6. Burning in the feet: I suspect it may be because of the cold weather versus Abraxane-related neuropathy 7. Nausea: Instructed her to take antinausea medications as prescribed.  Blood counts were reviewed from today. Anemia has resolved. She will receive chemotherapy tomorrow 02/22/2016.  Monitoring closely for chemotherapy toxicities  Return to clinic in 3 week for cycle 12 of Abraxane

## 2016-03-07 ENCOUNTER — Encounter: Payer: Self-pay | Admitting: *Deleted

## 2016-03-07 ENCOUNTER — Other Ambulatory Visit (HOSPITAL_BASED_OUTPATIENT_CLINIC_OR_DEPARTMENT_OTHER): Payer: BLUE CROSS/BLUE SHIELD

## 2016-03-07 ENCOUNTER — Encounter: Payer: Self-pay | Admitting: Hematology and Oncology

## 2016-03-07 ENCOUNTER — Ambulatory Visit (HOSPITAL_BASED_OUTPATIENT_CLINIC_OR_DEPARTMENT_OTHER): Payer: BLUE CROSS/BLUE SHIELD | Admitting: Hematology and Oncology

## 2016-03-07 ENCOUNTER — Ambulatory Visit: Payer: BLUE CROSS/BLUE SHIELD

## 2016-03-07 VITALS — BP 124/74 | HR 101 | Temp 97.8°F | Resp 18 | Wt 188.9 lb

## 2016-03-07 DIAGNOSIS — G620 Drug-induced polyneuropathy: Secondary | ICD-10-CM | POA: Diagnosis not present

## 2016-03-07 DIAGNOSIS — C50211 Malignant neoplasm of upper-inner quadrant of right female breast: Secondary | ICD-10-CM

## 2016-03-07 DIAGNOSIS — D649 Anemia, unspecified: Secondary | ICD-10-CM | POA: Diagnosis not present

## 2016-03-07 LAB — CBC WITH DIFFERENTIAL/PLATELET
BASO%: 0.2 % (ref 0.0–2.0)
Basophils Absolute: 0 10*3/uL (ref 0.0–0.1)
EOS ABS: 0.1 10*3/uL (ref 0.0–0.5)
EOS%: 2.4 % (ref 0.0–7.0)
HEMATOCRIT: 37.2 % (ref 34.8–46.6)
HEMOGLOBIN: 12.8 g/dL (ref 11.6–15.9)
LYMPH#: 0.8 10*3/uL — AB (ref 0.9–3.3)
LYMPH%: 17.6 % (ref 14.0–49.7)
MCH: 28.4 pg (ref 25.1–34.0)
MCHC: 34.4 g/dL (ref 31.5–36.0)
MCV: 82.7 fL (ref 79.5–101.0)
MONO#: 0.3 10*3/uL (ref 0.1–0.9)
MONO%: 6.7 % (ref 0.0–14.0)
NEUT%: 73.1 % (ref 38.4–76.8)
NEUTROS ABS: 3.4 10*3/uL (ref 1.5–6.5)
PLATELETS: 253 10*3/uL (ref 145–400)
RBC: 4.5 10*6/uL (ref 3.70–5.45)
RDW: 12.6 % (ref 11.2–14.5)
WBC: 4.7 10*3/uL (ref 3.9–10.3)

## 2016-03-07 LAB — COMPREHENSIVE METABOLIC PANEL
ALBUMIN: 3.8 g/dL (ref 3.5–5.0)
ALT: 27 U/L (ref 0–55)
AST: 21 U/L (ref 5–34)
Alkaline Phosphatase: 68 U/L (ref 40–150)
Anion Gap: 9 mEq/L (ref 3–11)
BILIRUBIN TOTAL: 0.36 mg/dL (ref 0.20–1.20)
BUN: 9.8 mg/dL (ref 7.0–26.0)
CALCIUM: 9.4 mg/dL (ref 8.4–10.4)
CHLORIDE: 105 meq/L (ref 98–109)
CO2: 26 meq/L (ref 22–29)
Creatinine: 0.7 mg/dL (ref 0.6–1.1)
GLUCOSE: 107 mg/dL (ref 70–140)
Potassium: 4 mEq/L (ref 3.5–5.1)
SODIUM: 140 meq/L (ref 136–145)
TOTAL PROTEIN: 6.7 g/dL (ref 6.4–8.3)

## 2016-03-07 NOTE — Progress Notes (Signed)
Unable to get in to exam room prior to MD.  No assessment performed.  

## 2016-03-07 NOTE — Progress Notes (Signed)
Patient Care Team: No Pcp Per Patient as PCP - General (General Practice) Brien Few, MD as Consulting Physician (Obstetrics and Gynecology) Rolm Bookbinder, MD as Consulting Physician (General Surgery) Nicholas Lose, MD as Consulting Physician (Hematology and Oncology) Arloa Koh, MD as Consulting Physician (Radiation Oncology) Rockwell Germany, RN as Registered Nurse Mauro Kaufmann, RN as Registered Nurse Sylvan Cheese, NP as Nurse Practitioner (Nurse Practitioner)  DIAGNOSIS: Breast cancer of upper-inner quadrant of right female breast [facility])   Staging form: Breast, AJCC 7th Edition     Clinical stage from 08/17/2015: Stage IA (T1c, N0, M0) - Unsigned       Staging comments: Staged at breast conference on 9.7.16  SUMMARY OF ONCOLOGIC HISTORY:   Breast cancer of upper-inner quadrant of right female breast ([place])   08/10/2015 Mammogram Right breast mass 1.2 cm irregular spiculated   08/11/2015 Initial Diagnosis 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, grade 2-3   08/29/2015 Procedure  genetic testing normal (Genes tested includeATM, BARD1, BRCA1, BRCA2, BRIP1, CDH1, CHEK2, FANCC, MLH1, MSH2, MSH6, NBN, PALB2, PMS2, PTEN, RAD51C, RAD51D, TP53, and XRCC2)   09/14/2015 Surgery Right Lumpectomy: IDC Grade 2/3, 1.2 cm, LVI Present, 0/1 LN T1CN0 (Stage 1A) Oncotype DX 44, 30% ROR   09/20/2015 Procedure genetic testing revealed no pathogenic mutations   11/09/2015 -  Chemotherapy adjuvant chemotherapy: Adriamycin and Cytoxan dose dense 4 followed by Abraxane weekly 12    CHIEF COMPLIANT: cycle 8 of Abraxane (stopping chemo)   INTERVAL HISTORY: Jessica Petty is a 40 year old with above-mentioned history of right breast cancer underwent lumpectomy and is currently on adjuvant chemotherapy because of high risk Oncotype DX score of 44.  Patient had been complaining of burning discomfort and intermittent pain underneath her feet bilaterally. She does not find that  it is constant. It certainly comes on after she is resting for a while. It hasn't gotten any worse since the last few chemotherapy treatments. However when probed more detail, she tells me that because of this discomfort she has been trying to not get out of the bed or do a whole lot of activity. So this has clearly been affecting her quality of life.  REVIEW OF SYSTEMS:   Constitutional: Denies fevers, chills or abnormal weight loss Eyes: Denies blurriness of vision Ears, nose, mouth, throat, and face: Denies mucositis or sore throat Respiratory: Denies cough, dyspnea or wheezes Cardiovascular: Denies palpitation, chest discomfort Gastrointestinal:  Denies nausea, heartburn or change in bowel habits Skin: Denies abnormal skin rashes Lymphatics: Denies new lymphadenopathy or easy bruising Neurological:intermittent burning discomfort underneath the feet limiting her activity. Behavioral/Psych: Mood is stable, no new changes  Extremities: No lower extremity edema All other systems were reviewed with the patient and are negative.  I have reviewed the past medical history, past surgical history, social history and family history with the patient and they are unchanged from previous note.  ALLERGIES:  is allergic to tamiflu.  MEDICATIONS:  Current Outpatient Prescriptions  Medication Sig Dispense Refill  . ADDERALL XR 20 MG 24 hr capsule Take 20 mg by mouth.  0  . ALPRAZolam (XANAX) 1 MG tablet Take 1 mg by mouth 3 (three) times daily as needed.  5  . ARIPiprazole (ABILIFY) 10 MG tablet Take 10 mg by mouth at bedtime.  11  . azithromycin (ZITHROMAX) 500 MG tablet Take by mouth daily.    Marland Kitchen dexamethasone (DECADRON) 4 MG tablet Take 1 tablets by mouth once a day on the day  after chemotherapy and then take 1 tablets two times a day for 2 days. Take with food. 30 tablet 1  . HYDROcodone-acetaminophen (NORCO) 10-325 MG tablet Take 1 tablet by mouth every 6 (six) hours as needed. (Patient not taking:  Reported on 02/29/2016) 20 tablet 0  . levofloxacin (LEVAQUIN) 500 MG tablet Take 1 tablet (500 mg total) by mouth daily. 7 tablet 0  . lidocaine-prilocaine (EMLA) cream Apply to affected area once 30 g 3  . LORazepam (ATIVAN) 0.5 MG tablet Take 1 tablet (0.5 mg total) by mouth at bedtime. (Patient not taking: Reported on 02/29/2016) 30 tablet 0  . ondansetron (ZOFRAN) 8 MG tablet Take 1 tablet (8 mg total) by mouth 2 (two) times daily as needed. Start on the third day after chemotherapy. (Patient not taking: Reported on 02/29/2016) 30 tablet 1  . oxyCODONE-acetaminophen (PERCOCET) 10-325 MG tablet Take 1 tablet by mouth every 6 (six) hours as needed for pain. (Patient not taking: Reported on 02/29/2016) 20 tablet 0  . prochlorperazine (COMPAZINE) 10 MG tablet Take 1 tablet (10 mg total) by mouth every 6 (six) hours as needed (Nausea or vomiting). (Patient not taking: Reported on 02/29/2016) 30 tablet 1  . Vitamin D, Ergocalciferol, (DRISDOL) 50000 UNITS CAPS capsule TAKE 1 CAPSULE BY MOUTH 2 TIMES WEEKLY AS DIRECTED  12   No current facility-administered medications for this visit.   Facility-Administered Medications Ordered in Other Visits  Medication Dose Route Frequency Provider Last Rate Last Dose  . sodium chloride 0.9 % injection 10 mL  10 mL Intracatheter PRN Nicholas Lose, MD   10 mL at 01/11/16 1103    PHYSICAL EXAMINATION: ECOG PERFORMANCE STATUS: 1 - Symptomatic but completely ambulatory  Filed Vitals:   03/07/16 0821  BP: 124/74  Pulse: 101  Temp: 97.8 F (36.6 C)  Resp: 18   Filed Weights   03/07/16 0821  Weight: 188 lb 14.4 oz (85.684 kg)    GENERAL:alert, no distress and comfortable SKIN: skin color, texture, turgor are normal, no rashes or significant lesions EYES: normal, Conjunctiva are pink and non-injected, sclera clear OROPHARYNX:no exudate, no erythema and lips, buccal mucosa, and tongue normal  NECK: supple, thyroid normal size, non-tender, without  nodularity LYMPH:  no palpable lymphadenopathy in the cervical, axillary or inguinal LUNGS: clear to auscultation and percussion with normal breathing effort HEART: regular rate & rhythm and no murmurs and no lower extremity edema ABDOMEN:abdomen soft, non-tender and normal bowel sounds MUSCULOSKELETAL:no cyanosis of digits and no clubbing  NEURO: alert & oriented x 3 with fluent speech, grade 2-3 peripheral neuropathy EXTREMITIES: No lower extremity edema  LABORATORY DATA:  I have reviewed the data as listed   Chemistry      Component Value Date/Time   NA 140 03/07/2016 0800   K 4.0 03/07/2016 0800   CO2 26 03/07/2016 0800   BUN 9.8 03/07/2016 0800   CREATININE 0.7 03/07/2016 0800      Component Value Date/Time   CALCIUM 9.4 03/07/2016 0800   ALKPHOS 68 03/07/2016 0800   AST 21 03/07/2016 0800   ALT 27 03/07/2016 0800   BILITOT 0.36 03/07/2016 0800       Lab Results  Component Value Date   WBC 4.7 03/07/2016   HGB 12.8 03/07/2016   HCT 37.2 03/07/2016   MCV 82.7 03/07/2016   PLT 253 03/07/2016   NEUTROABS 3.4 03/07/2016     ASSESSMENT & PLAN:  Breast cancer of upper-inner quadrant of right female breast ([place]) Right Lumpectomy: 09/14/2015  IDC Grade 2/3, 1.2 cm, LVI Present, 0/1 LN T1CN0 (Stage 1A) Oncotype DX 44, 30% ROR Oncotype DX counseling: I discussed the result of Oncotype DX scoring provided her with a copy of this report. Based upon her risk score of 44, with tamoxifen alone the risk of recurrence of 30%. This is very high risk and I recommended systemic chemotherapy. Adjuvant chemotherapy with dose dense Adriamycin and Cytoxan every 2 weeks 4 followed by Abraxane weekly 12 ( started 11/09/2015 completed 03/07/2016)  Treatment plan: 1. Adjuvant radiation 3. Followed by antiestrogen therapy ---------------------------------------------------------------------------------------------------------------------------------------------------------- Current  treatment: Completed Cycle 4 dose dense Adriamycin and Cytoxan , completed Abraxane X 7 ( stopping chemotherapy for peripheral neuropathy 03/07/2016) Chemotherapy monitoring: Echo 09/29/2015 EF 60-65%  Peripheral neuropathy: Grade 2 persistent. Discontinuing chemotherapy after cycle 7.  Blood counts were reviewed from today. Anemia has resolved hemoglobin 12.8. I will send a referral to radiation oncology for adjuvant radiation therapy. Return to clinic in 3 months for follow-up to discuss starting adjuvant antiestrogen therapy.  Orders Placed This Encounter  Procedures  . Ambulatory referral to Radiation Oncology    Referral Priority:  Routine    Referral Type:  Consultation    Referral Reason:  Specialty Services Required    Requested Specialty:  Radiation Oncology    Number of Visits Requested:  1   The patient has a good understanding of the overall plan. she agrees with it. she will call with any problems that may develop before the next visit here.   Rulon Eisenmenger, MD 03/07/2016

## 2016-03-14 ENCOUNTER — Other Ambulatory Visit: Payer: BLUE CROSS/BLUE SHIELD

## 2016-03-14 ENCOUNTER — Ambulatory Visit: Payer: BLUE CROSS/BLUE SHIELD

## 2016-03-19 NOTE — Progress Notes (Signed)
Location of Breast Cancer: Breast cancer of upper-inner quadrant of right female breast  Histology per Pathology Report:   10/12/15 Diagnosis Breast, excision, Right inferior margins - BENIGN BREAST TISSUE WITH BIOPSY SITE REACTION. - NO RESIDUAL DUCTAL CARCINOMA IN SITU. - FINAL MARGINS CLEAR.  09/14/15 Diagnosis 1. Lymph node, sentinel, biopsy, right axilla - THERE IS NO EVIDENCE OF CARCINOMA IN 1 OF 1 LYMPH NODE (0/1). 2. 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 - DUCTAL CARCINOMA IN SITU, INTERMEDIATE GRADE. - LYMPHOVASCULAR INVASION IS IDENTIFIED. - THE SURGICAL RESECTION MARGINS ARE NEGATIVE FOR CARCINOMA. - SEE ONCOLOGY TABLE BELOW 3. Breast, excision, right posterior margin - DUCTAL CARCINOMA IN SITU, HIGH GRADE. - DUCTAL CARCINOMA IN SITU IS FOCALLY LESS THAN 0.1 CM TO THE POSTERIOR MARGIN OF SPECIMEN # 3 AND FOCALLY LESS THAN 0.1 CM TO THE INFERIOR MARGIN OF SPECIMEN # 3.  08/11/15  Receptor Status: ER(100%), PR (100%), Her2-neu (negative)  Did patient present with symptoms (if so, please note symptoms) or was this found on screening mammography?: presented with a palpable mass along the medial aspect of the right breast, upper inner quadrant.   Past/Anticipated interventions by surgeon, if any: 09/14/15 - Procedure: RADIOACTIVE SEED GUIDED PARTIAL MASTECTOMY WITH AXILLARY SENTINEL LYMPH NODE BIOPSY;  Surgeon: Rolm Bookbinder, MD;  Location: [place];  Service: General;  Laterality: Right; 10/12/15 -Procedure: RE-EXCISION MARGIN RIGHT BREAST;  Surgeon: Rolm Bookbinder, MD;  Location: [place];  Service: General;  Laterality: Right;   Past/Anticipated interventions by medical oncology, if any: Adjuvant chemotherapy with dose dense Adriamycin and Cytoxan every 2 weeks 4 followed by Abraxane weekly 12 ( started 11/09/2015 completed 03/07/2016)    Lymphedema issues, if any:  no  Pain issues, if  any:  yes neuropathy in both feet.  OB Gyn history: Menarche age 36, IUD ~ 5-6 years, G1, P1,    SAFETY ISSUES:  Prior radiation? no  Pacemaker/ICD? no  Possible current pregnancy?no  Is the patient on methotrexate? no  Current Complaints / other details:  Patient originally saw Dr. Valere Dross.  BP 136/89 mmHg  Pulse 111  Temp(Src) 97.8 F (36.6 C) (Oral)  Resp 16  Ht _0  (1.549 m)  Wt 192 lb (87.091 kg)  BMI 36.30 kg/m2   Wt Readings from Last 3 Encounters:  03/22/16 192 lb (87.091 kg)  03/07/16 188 lb 14.4 oz (85.684 kg)  02/21/16 194 lb (87.998 kg)

## 2016-03-20 ENCOUNTER — Other Ambulatory Visit: Payer: BLUE CROSS/BLUE SHIELD

## 2016-03-20 ENCOUNTER — Ambulatory Visit: Payer: BLUE CROSS/BLUE SHIELD | Admitting: Nurse Practitioner

## 2016-03-21 ENCOUNTER — Other Ambulatory Visit: Payer: BLUE CROSS/BLUE SHIELD

## 2016-03-21 ENCOUNTER — Ambulatory Visit: Payer: BLUE CROSS/BLUE SHIELD

## 2016-03-22 ENCOUNTER — Encounter: Payer: Self-pay | Admitting: Radiation Oncology

## 2016-03-22 ENCOUNTER — Ambulatory Visit
Admission: RE | Admit: 2016-03-22 | Discharge: 2016-03-22 | Disposition: A | Discharge status: Home / Self Care | Payer: BLUE CROSS/BLUE SHIELD | Source: Ambulatory Visit | Attending: Radiation Oncology | Admitting: Radiation Oncology

## 2016-03-22 VITALS — BP 136/89 | HR 111 | Temp 97.8°F | Resp 16 | Ht 61.0 in | Wt 192.0 lb

## 2016-03-22 DIAGNOSIS — C50211 Malignant neoplasm of upper-inner quadrant of right female breast: Secondary | ICD-10-CM | POA: Diagnosis present

## 2016-03-22 DIAGNOSIS — Z170 Estrogen receptor positive status [ER+]: Secondary | ICD-10-CM | POA: Diagnosis not present

## 2016-03-22 DIAGNOSIS — Z510 Encounter for antineoplastic radiation therapy: Secondary | ICD-10-CM | POA: Diagnosis not present

## 2016-03-22 NOTE — Progress Notes (Signed)
Please see the Nurse Progress Note in the MD Initial Consult Encounter for this patient. 

## 2016-03-22 NOTE — Progress Notes (Signed)
Radiation Oncology         [phone] ________________________________  Name: Jessica Petty MRN: [id number]  Date: 03/22/2016  DOB: Feb 21, 1976  Follow-Up Visit Note  CC: No PCP Per Patient  Nicholas Lose, MD  Diagnosis: Breast cancer of upper-inner quadrant of right female breast [facility])   Staging form: Breast, AJCC 7th Edition     Clinical stage from 08/17/2015: Stage IA (T1c, N0, M0) - Unsigned       Staging comments: Staged at breast conference on 9.7.16  Narrative:  The patient returns today for routine follow-up.  The patient was originally seen in multidisciplinary clinic by Dr. Valere Dross. She was felt to be a good candidate for breast conservation treatment. She has completed surgery consisting of a right lumpectomy and sentinel lymph node biopsy. Final pathology revealed a 1.2 cm grade 2/3 invasive ductal carcinoma with intermediate grade DCIS and lymphovascular invasion (pT1c, pN0) (ER/PR positive, HER2 negative). DCIS was focally less than 0.1 cm to the posterior margin (pectoralis fascia) and also less than 0.1 cm to the inferior margin. Dr. Donne Hazel re-excised the posterior margin after his initial excision. He went down to the pectoralis fascia. The surgical resection margins were negative. 0/1 lymph nodes biopsied were negative. The patient underwent a breast re-excision of the right inferior margins. This found benign breast tissue and no residual DCIS. Oncotype DX score was 44, high risk. She underwent Adriamycin and Cytoxan dose dense 4 followed by Abraxane weekly 12.The patient has completed chemotherapy. She is appropriate to proceed with adjuvant radiation treatment at this time.  She denies nipple discharge or bleeding. Denies numbness or swelling of her upper extremities.  ALLERGIES:  is allergic to tamiflu.  Meds: Current Outpatient Prescriptions  Medication Sig Dispense Refill  . ADDERALL XR 20 MG 24 hr capsule Take 20 mg by mouth.  0  . ALPRAZolam (XANAX) 1 MG  tablet Take 1 mg by mouth 3 (three) times daily as needed.  5  . ARIPiprazole (ABILIFY) 10 MG tablet Take 10 mg by mouth at bedtime.  11  . lidocaine-prilocaine (EMLA) cream Apply to affected area once 30 g 3  . HYDROcodone-acetaminophen (NORCO) 10-325 MG tablet Take 1 tablet by mouth every 6 (six) hours as needed. (Patient not taking: Reported on 02/29/2016) 20 tablet 0  . oxyCODONE-acetaminophen (PERCOCET) 10-325 MG tablet Take 1 tablet by mouth every 6 (six) hours as needed for pain. (Patient not taking: Reported on 02/29/2016) 20 tablet 0  . Vitamin D, Ergocalciferol, (DRISDOL) 50000 UNITS CAPS capsule Reported on 03/22/2016  12   No current facility-administered medications for this encounter.   Facility-Administered Medications Ordered in Other Encounters  Medication Dose Route Frequency Provider Last Rate Last Dose  . sodium chloride 0.9 % injection 10 mL  10 mL Intracatheter PRN Nicholas Lose, MD   10 mL at 01/11/16 1103    Physical Findings: The patient is in no acute distress. Patient is alert and oriented.  height is '5\' 1"'  (1.549 m) and weight is 192 lb (87.091 kg). Her oral temperature is 97.8 F (36.6 C). Her blood pressure is 136/89 and her pulse is 111. Her respiration is 16.   Lungs are clear to auscultation bilaterally. Heart has regular rate and rhythm. No palpable cervical, supraclavicular, or axillary adenopathy.  The patient is s/p right lumpectomy and right axillary lymph node biopsy. No palpable masses, nipple discharge, or bleeding of the left breast. Right breast shows a faint periareolar scar and a secondary scar in the  right axillary region. No palpable masses or signs of recurrence. Port-a-cath located in the right upper chest.  Lab Findings: Lab Results  Component Value Date   WBC 4.7 03/07/2016   HGB 12.8 03/07/2016   HCT 37.2 03/07/2016   MCV 82.7 03/07/2016   PLT 253 03/07/2016     Radiographic Findings: Dg Chest 2 View  02/29/2016  CLINICAL DATA:  Cough  for 4 days, history of breast carcinoma EXAM: CHEST  2 VIEW COMPARISON:  10/12/2015 FINDINGS: Cardiac shadow is within normal limits. Right chest wall port is again seen and stable. The lungs are well aerated bilaterally. No focal infiltrate or sizable effusion is seen. No bony abnormality is noted. IMPRESSION: No acute abnormality noted. Electronically Signed   By: Inez Catalina M.D.   On: 02/29/2016 09:25    Impression: The patient is status post lumpectomy and chemotherapy as part of her breast conservation treatment strategy. The patient is appropriate to proceed with adjuvant radiation treatment at this time.  I discussed with the patient the role of radiation treatment in this setting. We discussed the expected benefit in terms of local/regional control area we also discussed the possible side effects and risks of treatment. All of her questions were answered.  We also discussed the logistics of treatment. The patient wishes to proceed with simulation.  Plan:  The patient will be scheduled for CT simulation on 03/27/16 such that we can begin treatment planning. I anticipate treating the patient with a 5 1/2 week course of radiation treatment. This will correspond to whole breast radiation treatment to the right breast using tangent fields. The patient signed a consent form and this was placed in her medical chart.  I have recommended that the patient delay removal of her Port-A-Cath until she has completed her radiation therapy since this is on the right upper chest and will be involved in her radiation field. The other option would be to remove her Port-A-Cath now and proceed with radiation therapy after appropriate recuperation. She will call back if she wishes to postpone her simulation appointment after talking with Dr. Donne Hazel.  -----------------------------------  Blair Promise, PhD, MD  This document serves as a record of services personally performed by Gery Pray, MD. It was created  on his behalf by Darcus Austin, a trained medical scribe. The creation of this record is based on the scribe's personal observations and the provider's statements to them. This document has been checked and approved by the attending provider.

## 2016-03-27 ENCOUNTER — Ambulatory Visit
Admission: RE | Admit: 2016-03-27 | Discharge: 2016-03-27 | Disposition: A | Discharge status: Home / Self Care | Payer: BLUE CROSS/BLUE SHIELD | Source: Ambulatory Visit | Attending: Radiation Oncology | Admitting: Radiation Oncology

## 2016-03-27 DIAGNOSIS — Z510 Encounter for antineoplastic radiation therapy: Secondary | ICD-10-CM | POA: Diagnosis not present

## 2016-03-27 DIAGNOSIS — C50211 Malignant neoplasm of upper-inner quadrant of right female breast: Secondary | ICD-10-CM

## 2016-03-28 ENCOUNTER — Ambulatory Visit: Payer: BLUE CROSS/BLUE SHIELD

## 2016-03-29 ENCOUNTER — Telehealth: Payer: Self-pay | Admitting: Hematology and Oncology

## 2016-03-29 ENCOUNTER — Other Ambulatory Visit: Payer: Self-pay | Admitting: *Deleted

## 2016-03-29 NOTE — Telephone Encounter (Signed)
Gave and printed appt sched and avs for pt for June °

## 2016-03-29 NOTE — Progress Notes (Signed)
  Radiation Oncology         [phone] ________________________________  Name: Jessica Petty MRN: [id number]  Date: 03/27/2016  DOB: Dec 06, 1976  SIMULATION AND TREATMENT PLANNING NOTE    ICD-9-CM ICD-10-CM   1. Breast cancer of upper-inner quadrant of right female breast ([place]) 174.2 C50.211     DIAGNOSIS:  Breast cancer of upper-inner quadrant of right female breast [facility])  Staging form: Breast, AJCC 7th Edition  Clinical stage from 08/17/2015: Stage IA (T1c, N0, M0) - Unsigned (pT1c, pN0)    NARRATIVE:  The patient was brought to the [place].  Identity was confirmed.  All relevant records and images related to the planned course of therapy were reviewed.  The patient freely provided informed written consent to proceed with treatment after reviewing the details related to the planned course of therapy. The consent form was witnessed and verified by the simulation staff.  Then, the patient was set-up in a stable reproducible  supine position for radiation therapy.  CT images were obtained.  Surface markings were placed.  The CT images were loaded into the planning software.  Then the target and avoidance structures were contoured.  Treatment planning then occurred.  The radiation prescription was entered and confirmed.  Then, I designed and supervised the construction of a total of 3 medically necessary complex treatment devices.  I have requested : 3D Simulation  I have requested a DVH of the following structures: Heart lungs, lumpectomy cavity.  I have ordered:dose calc.  PLAN:  The patient will receive 50.4 Gy in 28 fractions. No boost will be given to the lumpectomy cavity in light of the reexcision showing no residual tumor.  ________________________________  -----------------------------------  Blair Promise, PhD, MD

## 2016-04-03 DIAGNOSIS — Z510 Encounter for antineoplastic radiation therapy: Secondary | ICD-10-CM | POA: Diagnosis not present

## 2016-04-04 ENCOUNTER — Ambulatory Visit: Payer: BLUE CROSS/BLUE SHIELD

## 2016-04-04 ENCOUNTER — Ambulatory Visit
Admission: RE | Admit: 2016-04-04 | Discharge: 2016-04-04 | Disposition: A | Discharge status: Home / Self Care | Payer: BLUE CROSS/BLUE SHIELD | Source: Ambulatory Visit | Attending: Radiation Oncology | Admitting: Radiation Oncology

## 2016-04-04 DIAGNOSIS — Z510 Encounter for antineoplastic radiation therapy: Secondary | ICD-10-CM | POA: Diagnosis not present

## 2016-04-05 ENCOUNTER — Ambulatory Visit
Admission: RE | Admit: 2016-04-05 | Discharge: 2016-04-05 | Disposition: A | Discharge status: Home / Self Care | Payer: BLUE CROSS/BLUE SHIELD | Source: Ambulatory Visit | Attending: Radiation Oncology | Admitting: Radiation Oncology

## 2016-04-05 DIAGNOSIS — Z510 Encounter for antineoplastic radiation therapy: Secondary | ICD-10-CM | POA: Diagnosis not present

## 2016-04-06 ENCOUNTER — Ambulatory Visit
Admission: RE | Admit: 2016-04-06 | Discharge: 2016-04-06 | Disposition: A | Discharge status: Home / Self Care | Payer: BLUE CROSS/BLUE SHIELD | Source: Ambulatory Visit | Attending: Radiation Oncology | Admitting: Radiation Oncology

## 2016-04-06 DIAGNOSIS — Z510 Encounter for antineoplastic radiation therapy: Secondary | ICD-10-CM | POA: Diagnosis not present

## 2016-04-09 ENCOUNTER — Ambulatory Visit
Admission: RE | Admit: 2016-04-09 | Discharge: 2016-04-09 | Disposition: A | Discharge status: Home / Self Care | Payer: BLUE CROSS/BLUE SHIELD | Source: Ambulatory Visit | Attending: Radiation Oncology | Admitting: Radiation Oncology

## 2016-04-09 DIAGNOSIS — Z510 Encounter for antineoplastic radiation therapy: Secondary | ICD-10-CM | POA: Diagnosis not present

## 2016-04-10 ENCOUNTER — Ambulatory Visit
Admission: RE | Admit: 2016-04-10 | Discharge: 2016-04-10 | Disposition: A | Discharge status: Home / Self Care | Payer: BLUE CROSS/BLUE SHIELD | Source: Ambulatory Visit | Attending: Radiation Oncology | Admitting: Radiation Oncology

## 2016-04-10 DIAGNOSIS — Z510 Encounter for antineoplastic radiation therapy: Secondary | ICD-10-CM | POA: Diagnosis not present

## 2016-04-11 ENCOUNTER — Ambulatory Visit
Admission: RE | Admit: 2016-04-11 | Discharge: 2016-04-11 | Disposition: A | Discharge status: Home / Self Care | Payer: BLUE CROSS/BLUE SHIELD | Source: Ambulatory Visit | Attending: Radiation Oncology | Admitting: Radiation Oncology

## 2016-04-11 ENCOUNTER — Encounter: Payer: Self-pay | Admitting: Radiation Oncology

## 2016-04-11 VITALS — BP 134/85 | HR 88 | Temp 98.3°F | Ht 61.0 in | Wt 191.7 lb

## 2016-04-11 DIAGNOSIS — Z510 Encounter for antineoplastic radiation therapy: Secondary | ICD-10-CM | POA: Diagnosis not present

## 2016-04-11 DIAGNOSIS — C50211 Malignant neoplasm of upper-inner quadrant of right female breast: Secondary | ICD-10-CM

## 2016-04-11 NOTE — Progress Notes (Signed)
Jessica Petty has completed 5 fractions to her right breast.  She denies having pain or fatigue.  The skin on her right breast is intact.  BP 134/85 mmHg  Pulse 88  Temp(Src) 98.3 F (36.8 C) (Oral)  Ht 5\' 1"  (1.549 m)  Wt 191 lb 11.2 oz (86.955 kg)  BMI 36.24 kg/m2

## 2016-04-11 NOTE — Progress Notes (Signed)
  Radiation Oncology         [phone] ________________________________  Name: Jessica Petty MRN: [id number]  Date: 04/11/2016  DOB: Jun 10, 1976  Weekly Radiation Therapy Management    ICD-9-CM ICD-10-CM   1. Breast cancer of upper-inner quadrant of right female breast ([facility]) 174.2 C50.211      Current Dose: 9 Gy     Planned Dose:  50.4 Gy  Narrative . . . . . . . . The patient presents for routine under treatment assessment.                          Jessica Petty has completed 5 fractions to her right breast. She denies having pain or fatigue. The nurse notes that the skin on her right breast is intact. She denies itching, discomfort, or fatigue.                                 Set-up films were reviewed.                                 The chart was checked. Physical Findings. . .  height is 5\' 1"  (1.549 m) and weight is 191 lb 11.2 oz (86.955 kg). Her oral temperature is 98.3 F (36.8 C). Her blood pressure is 134/85 and her pulse is 88. . Weight essentially stable.  No significant reaction changes at this time.  The patient's scars  are well-healed along the right breast area Impression . . . . . . . The patient is tolerating radiation. Plan . . . . . . . . . . . . Continue treatment as planned.  ________________________________   Blair Promise, PhD, MD  This document serves as a record of services personally performed by Gery Pray, MD. It was created on his behalf by Darcus Austin, a trained medical scribe. The creation of this record is based on the scribe's personal observations and the provider's statements to them. This document has been checked and approved by the attending provider.

## 2016-04-12 ENCOUNTER — Ambulatory Visit
Admission: RE | Admit: 2016-04-12 | Discharge: 2016-04-12 | Disposition: A | Discharge status: Home / Self Care | Payer: BLUE CROSS/BLUE SHIELD | Source: Ambulatory Visit | Attending: Radiation Oncology | Admitting: Radiation Oncology

## 2016-04-12 DIAGNOSIS — Z510 Encounter for antineoplastic radiation therapy: Secondary | ICD-10-CM | POA: Diagnosis not present

## 2016-04-13 ENCOUNTER — Ambulatory Visit
Admission: RE | Admit: 2016-04-13 | Discharge: 2016-04-13 | Disposition: A | Discharge status: Home / Self Care | Payer: BLUE CROSS/BLUE SHIELD | Source: Ambulatory Visit | Attending: Radiation Oncology | Admitting: Radiation Oncology

## 2016-04-13 DIAGNOSIS — Z510 Encounter for antineoplastic radiation therapy: Secondary | ICD-10-CM | POA: Diagnosis not present

## 2016-04-16 ENCOUNTER — Ambulatory Visit
Admission: RE | Admit: 2016-04-16 | Discharge: 2016-04-16 | Disposition: A | Discharge status: Home / Self Care | Payer: BLUE CROSS/BLUE SHIELD | Source: Ambulatory Visit | Attending: Radiation Oncology | Admitting: Radiation Oncology

## 2016-04-16 DIAGNOSIS — Z510 Encounter for antineoplastic radiation therapy: Secondary | ICD-10-CM | POA: Diagnosis not present

## 2016-04-17 ENCOUNTER — Encounter: Payer: Self-pay | Admitting: Radiation Oncology

## 2016-04-17 ENCOUNTER — Ambulatory Visit
Admission: RE | Admit: 2016-04-17 | Discharge: 2016-04-17 | Disposition: A | Discharge status: Home / Self Care | Payer: BLUE CROSS/BLUE SHIELD | Source: Ambulatory Visit | Attending: Radiation Oncology | Admitting: Radiation Oncology

## 2016-04-17 VITALS — BP 135/82 | HR 95 | Temp 98.3°F | Ht 61.0 in | Wt 192.6 lb

## 2016-04-17 DIAGNOSIS — C50211 Malignant neoplasm of upper-inner quadrant of right female breast: Secondary | ICD-10-CM

## 2016-04-17 DIAGNOSIS — Z510 Encounter for antineoplastic radiation therapy: Secondary | ICD-10-CM | POA: Diagnosis not present

## 2016-04-17 NOTE — Progress Notes (Signed)
Weekly Management Note Current Dose: 16.2 Gy  Projected Dose: 50.4 Gy   Narrative:  The patient presents for routine under treatment assessment.  CBCT/MVCT images/Port film x-rays were reviewed.  The chart was checked. Jessica Petty has completed 9 fractions to her right breast. She denies having pain and an increase in fatigue. She is using radiaplex as directed. The skin on her right breast has slight hyperpigmentation.    Physical Findings:  No skin changes.  Vitals:  Filed Vitals:   04/17/16 0817  BP: 135/82  Pulse: 95  Temp: 98.3 F (36.8 C)   Weight:  Wt Readings from Last 3 Encounters:  04/17/16 192 lb 9.6 oz (87.363 kg)  04/11/16 191 lb 11.2 oz (86.955 kg)  03/22/16 192 lb (87.091 kg)   Lab Results  Component Value Date   WBC 4.7 03/07/2016   HGB 12.8 03/07/2016   HCT 37.2 03/07/2016   MCV 82.7 03/07/2016   PLT 253 03/07/2016   Lab Results  Component Value Date   CREATININE 0.7 03/07/2016   BUN 9.8 03/07/2016   NA 140 03/07/2016   K 4.0 03/07/2016   CO2 26 03/07/2016     Impression:  The patient is tolerating radiation.  Plan:  Continue treatment as planned. Continue radiaplex.     ------------------------------------------------  Thea Silversmith, MD    This document serves as a record of services personally performed by Thea Silversmith, MD. It was created on her behalf by  Lendon Collar, a trained medical scribe. The creation of this record is based on the scribe's personal observations and the provider's statements to them. This document has been checked and approved by the attending provider.

## 2016-04-17 NOTE — Progress Notes (Signed)
Jessica Petty has completed 9 fractions to her right breast.  She denies having pain and an increase in fatigue.  She is using radiaplex as directed.  The skin on her right breast has slight hyperpigmentation.   BP 135/82 mmHg  Pulse 95  Temp(Src) 98.3 F (36.8 C) (Oral)  Ht 5\' 1"  (1.549 m)  Wt 192 lb 9.6 oz (87.363 kg)  BMI 36.41 kg/m2   Wt Readings from Last 3 Encounters:  04/17/16 192 lb 9.6 oz (87.363 kg)  04/11/16 191 lb 11.2 oz (86.955 kg)  03/22/16 192 lb (87.091 kg)

## 2016-04-18 ENCOUNTER — Telehealth: Payer: Self-pay | Admitting: *Deleted

## 2016-04-18 ENCOUNTER — Ambulatory Visit
Admission: RE | Admit: 2016-04-18 | Discharge: 2016-04-18 | Disposition: A | Discharge status: Home / Self Care | Payer: BLUE CROSS/BLUE SHIELD | Source: Ambulatory Visit | Attending: Radiation Oncology | Admitting: Radiation Oncology

## 2016-04-18 DIAGNOSIS — Z510 Encounter for antineoplastic radiation therapy: Secondary | ICD-10-CM | POA: Diagnosis not present

## 2016-04-18 NOTE — Telephone Encounter (Signed)
Spoke with patient to follow up after start of radiation.  She states she is doing well.  Encouraged her to call with any needs or concerns. 

## 2016-04-19 ENCOUNTER — Ambulatory Visit
Admission: RE | Admit: 2016-04-19 | Discharge: 2016-04-19 | Disposition: A | Discharge status: Home / Self Care | Payer: BLUE CROSS/BLUE SHIELD | Source: Ambulatory Visit | Attending: Radiation Oncology | Admitting: Radiation Oncology

## 2016-04-19 DIAGNOSIS — Z510 Encounter for antineoplastic radiation therapy: Secondary | ICD-10-CM | POA: Diagnosis not present

## 2016-04-20 ENCOUNTER — Ambulatory Visit
Admission: RE | Admit: 2016-04-20 | Discharge: 2016-04-20 | Disposition: A | Discharge status: Home / Self Care | Payer: BLUE CROSS/BLUE SHIELD | Source: Ambulatory Visit | Attending: Radiation Oncology | Admitting: Radiation Oncology

## 2016-04-20 DIAGNOSIS — Z510 Encounter for antineoplastic radiation therapy: Secondary | ICD-10-CM | POA: Diagnosis not present

## 2016-04-23 ENCOUNTER — Ambulatory Visit
Admission: RE | Admit: 2016-04-23 | Discharge: 2016-04-23 | Disposition: A | Discharge status: Home / Self Care | Payer: BLUE CROSS/BLUE SHIELD | Source: Ambulatory Visit | Attending: Radiation Oncology | Admitting: Radiation Oncology

## 2016-04-23 DIAGNOSIS — Z510 Encounter for antineoplastic radiation therapy: Secondary | ICD-10-CM | POA: Diagnosis not present

## 2016-04-24 ENCOUNTER — Ambulatory Visit: Payer: BLUE CROSS/BLUE SHIELD | Admitting: Radiation Oncology

## 2016-04-24 ENCOUNTER — Ambulatory Visit
Admission: RE | Admit: 2016-04-24 | Discharge: 2016-04-24 | Disposition: A | Discharge status: Home / Self Care | Payer: BLUE CROSS/BLUE SHIELD | Source: Ambulatory Visit | Attending: Radiation Oncology | Admitting: Radiation Oncology

## 2016-04-24 DIAGNOSIS — Z510 Encounter for antineoplastic radiation therapy: Secondary | ICD-10-CM | POA: Diagnosis not present

## 2016-04-25 ENCOUNTER — Encounter: Payer: Self-pay | Admitting: Radiation Oncology

## 2016-04-25 ENCOUNTER — Ambulatory Visit
Admission: RE | Admit: 2016-04-25 | Discharge: 2016-04-25 | Disposition: A | Discharge status: Home / Self Care | Payer: BLUE CROSS/BLUE SHIELD | Source: Ambulatory Visit | Attending: Radiation Oncology | Admitting: Radiation Oncology

## 2016-04-25 VITALS — BP 133/93 | HR 98 | Resp 16 | Wt 196.0 lb

## 2016-04-25 DIAGNOSIS — C50211 Malignant neoplasm of upper-inner quadrant of right female breast: Secondary | ICD-10-CM

## 2016-04-25 DIAGNOSIS — Z510 Encounter for antineoplastic radiation therapy: Secondary | ICD-10-CM | POA: Diagnosis not present

## 2016-04-25 NOTE — Progress Notes (Signed)
  Radiation Oncology         [phone] ________________________________  Name: Jessica Petty MRN: [id number]  Date: 04/25/2016  DOB: 1976/09/30  Weekly Radiation Therapy Management    ICD-9-CM ICD-10-CM   1. Breast cancer of upper-inner quadrant of right female breast (HCC) 174.2 C50.211      Current Dose: 27 Gy     Planned Dose:  50.4 Gy  Narrative . . . . . . . . The patient presents for routine under treatment assessment.                        Weight and vitals stable. Denies pain. Reports increased fatigue. Reports hyperpigmentation within treatment field without desquamation. Reports using radiaplex bid as directed. Denies nipple discharge. No lymphedema of upper extremities noted. 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 She continues to work without issue.                                  Set-up films were reviewed.                                 The chart was checked. Physical Findings. . .  weight is 196 lb (88.905 kg). Her blood pressure is 133/93 and her pulse is 98. Her respiration is 16 and oxygen saturation is 100%. . Weight essentially stable.  No palpable subclavicular or axillary adenopathy. The lungs are clear to auscultation. The heart has a regular rhythm and rate.  Some erythema of the right breast with no skin breakdown. Impression . . . . . . . The patient is tolerating radiation. Plan . . . . . . . . . . . . Continue treatment as planned.  ________________________________   Blair Promise, PhD, MD  This document serves as a record of services personally performed by Gery Pray, MD. It was created on his behalf by Arlyce Harman, a trained medical scribe. The creation of this record is based on the scribe's personal observations and the provider's statements to them. This document has been checked and approved by the attending provider.

## 2016-04-25 NOTE — Progress Notes (Signed)
Weight and vitals stable. Denies pain. Reports increased fatigue. Reports hyperpigmentation within treatment field without desquamation. Reports using radiaplex bid as directed. Denies nipple discharge. No lymphedema of upper extremities noted. 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  BP 133/93 mmHg  Pulse 98  Resp 16  Wt 196 lb (88.905 kg)  SpO2 100% Wt Readings from Last 3 Encounters:  04/25/16 196 lb (88.905 kg)  04/17/16 192 lb 9.6 oz (87.363 kg)  04/11/16 191 lb 11.2 oz (86.955 kg)

## 2016-04-26 ENCOUNTER — Ambulatory Visit
Admission: RE | Admit: 2016-04-26 | Discharge: 2016-04-26 | Disposition: A | Discharge status: Home / Self Care | Payer: BLUE CROSS/BLUE SHIELD | Source: Ambulatory Visit | Attending: Radiation Oncology | Admitting: Radiation Oncology

## 2016-04-26 DIAGNOSIS — Z510 Encounter for antineoplastic radiation therapy: Secondary | ICD-10-CM | POA: Diagnosis not present

## 2016-04-27 ENCOUNTER — Ambulatory Visit
Admission: RE | Admit: 2016-04-27 | Discharge: 2016-04-27 | Disposition: A | Discharge status: Home / Self Care | Payer: BLUE CROSS/BLUE SHIELD | Source: Ambulatory Visit | Attending: Radiation Oncology | Admitting: Radiation Oncology

## 2016-04-27 DIAGNOSIS — Z510 Encounter for antineoplastic radiation therapy: Secondary | ICD-10-CM | POA: Diagnosis not present

## 2016-04-30 ENCOUNTER — Ambulatory Visit
Admission: RE | Admit: 2016-04-30 | Discharge: 2016-04-30 | Disposition: A | Discharge status: Home / Self Care | Payer: BLUE CROSS/BLUE SHIELD | Source: Ambulatory Visit | Attending: Radiation Oncology | Admitting: Radiation Oncology

## 2016-04-30 DIAGNOSIS — Z510 Encounter for antineoplastic radiation therapy: Secondary | ICD-10-CM | POA: Diagnosis not present

## 2016-05-01 ENCOUNTER — Ambulatory Visit
Admission: RE | Admit: 2016-05-01 | Discharge: 2016-05-01 | Disposition: A | Discharge status: Home / Self Care | Payer: BLUE CROSS/BLUE SHIELD | Source: Ambulatory Visit | Attending: Radiation Oncology | Admitting: Radiation Oncology

## 2016-05-01 ENCOUNTER — Encounter: Payer: Self-pay | Admitting: Radiation Oncology

## 2016-05-01 VITALS — BP 133/84 | HR 89 | Temp 98.3°F | Resp 16 | Ht 61.0 in | Wt 196.5 lb

## 2016-05-01 DIAGNOSIS — C50211 Malignant neoplasm of upper-inner quadrant of right female breast: Secondary | ICD-10-CM

## 2016-05-01 DIAGNOSIS — Z510 Encounter for antineoplastic radiation therapy: Secondary | ICD-10-CM | POA: Diagnosis not present

## 2016-05-01 NOTE — Progress Notes (Signed)
  Radiation Oncology         [phone] ________________________________  Name: Jessica Petty MRN: [id number]  Date: 05/01/2016  DOB: March 09, 1976  Weekly Radiation Therapy Management    ICD-9-CM ICD-10-CM   1. Breast cancer of upper-inner quadrant of right female breast (HCC) 174.2 C50.211      Current Dose: 34.2 Gy     Planned Dose:  50.4 Gy  Narrative . . . . . . . . The patient presents for routine under treatment assessment.                                 Tallia has completed 19 fractions to her right breast. She reports pain and soreness in her right breast that she is rating at a 2/10. She also reports feeling heat radiating from her right breast. She reports having fatigue. She is using radiaplex.  She continues to work her full-time schedule                                 Set-up films were reviewed.                                 The chart was checked. Physical Findings. . .  height is 5\' 1"  (1.549 m) and weight is 196 lb 8 oz (89.132 kg). Her oral temperature is 98.3 F (36.8 C). Her blood pressure is 133/84 and her pulse is 89. Her respiration is 16. . The lungs are clear. The heart has a regular rhythm and rate. The right breast area shows diffuse erythema without any skin breakdown.  Impression . . . . . . . The patient is tolerating radiation. Plan . . . . . . . . . . . . Continue treatment as planned.  ________________________________   Blair Promise, PhD, MD

## 2016-05-01 NOTE — Progress Notes (Signed)
Jessica Petty has completed 19 fractions to her right breast.  She reports pain and soreness in her right breast that she is rating at a 2/10.  She also reports feeling heat radiating from her right breast.  She reports having fatigue.  She is using radiaplex.  The skin on her right breast is red.  BP 133/84 mmHg  Pulse 89  Temp(Src) 98.3 F (36.8 C) (Oral)  Resp 16  Ht 5\' 1"  (1.549 m)  Wt 196 lb 8 oz (89.132 kg)  BMI 37.15 kg/m2   Wt Readings from Last 3 Encounters:  05/01/16 196 lb 8 oz (89.132 kg)  04/25/16 196 lb (88.905 kg)  04/17/16 192 lb 9.6 oz (87.363 kg)

## 2016-05-02 ENCOUNTER — Ambulatory Visit
Admission: RE | Admit: 2016-05-02 | Discharge: 2016-05-02 | Disposition: A | Discharge status: Home / Self Care | Payer: BLUE CROSS/BLUE SHIELD | Source: Ambulatory Visit | Attending: Radiation Oncology | Admitting: Radiation Oncology

## 2016-05-02 DIAGNOSIS — Z510 Encounter for antineoplastic radiation therapy: Secondary | ICD-10-CM | POA: Diagnosis not present

## 2016-05-03 ENCOUNTER — Ambulatory Visit
Admission: RE | Admit: 2016-05-03 | Discharge: 2016-05-03 | Disposition: A | Discharge status: Home / Self Care | Payer: BLUE CROSS/BLUE SHIELD | Source: Ambulatory Visit | Attending: Radiation Oncology | Admitting: Radiation Oncology

## 2016-05-03 DIAGNOSIS — Z510 Encounter for antineoplastic radiation therapy: Secondary | ICD-10-CM | POA: Diagnosis not present

## 2016-05-04 ENCOUNTER — Ambulatory Visit
Admission: RE | Admit: 2016-05-04 | Discharge: 2016-05-04 | Disposition: A | Discharge status: Home / Self Care | Payer: BLUE CROSS/BLUE SHIELD | Source: Ambulatory Visit | Attending: Radiation Oncology | Admitting: Radiation Oncology

## 2016-05-04 DIAGNOSIS — Z510 Encounter for antineoplastic radiation therapy: Secondary | ICD-10-CM | POA: Diagnosis not present

## 2016-05-08 ENCOUNTER — Encounter: Payer: Self-pay | Admitting: Radiation Oncology

## 2016-05-08 ENCOUNTER — Ambulatory Visit
Admission: RE | Admit: 2016-05-08 | Discharge: 2016-05-08 | Disposition: A | Discharge status: Home / Self Care | Payer: BLUE CROSS/BLUE SHIELD | Source: Ambulatory Visit | Attending: Radiation Oncology | Admitting: Radiation Oncology

## 2016-05-08 DIAGNOSIS — Z510 Encounter for antineoplastic radiation therapy: Secondary | ICD-10-CM | POA: Diagnosis not present

## 2016-05-09 ENCOUNTER — Ambulatory Visit
Admission: RE | Admit: 2016-05-09 | Discharge: 2016-05-09 | Disposition: A | Discharge status: Home / Self Care | Payer: BLUE CROSS/BLUE SHIELD | Source: Ambulatory Visit | Attending: Radiation Oncology | Admitting: Radiation Oncology

## 2016-05-09 ENCOUNTER — Encounter: Payer: Self-pay | Admitting: Radiation Oncology

## 2016-05-09 VITALS — BP 133/88 | HR 87 | Temp 98.4°F | Ht 61.0 in | Wt 195.7 lb

## 2016-05-09 DIAGNOSIS — C50211 Malignant neoplasm of upper-inner quadrant of right female breast: Secondary | ICD-10-CM | POA: Diagnosis not present

## 2016-05-09 DIAGNOSIS — Z510 Encounter for antineoplastic radiation therapy: Secondary | ICD-10-CM | POA: Diagnosis not present

## 2016-05-09 NOTE — Progress Notes (Signed)
Jessica Petty has completed 24 fractions to her right breast.  She reports having pain under her right arm that she is rating at a 4/10.  She reports that she is fatigued.  She is using radiaplex. The skin on her right breast and underarm is red with peeling over her nipple area.  She has been given hydrogel pads to try.  BP 133/88 mmHg  Pulse 87  Temp(Src) 98.4 F (36.9 C) (Oral)  Ht 5\' 1"  (1.549 m)  Wt 195 lb 11.2 oz (88.769 kg)  BMI 37.00 kg/m2   Wt Readings from Last 3 Encounters:  05/09/16 195 lb 11.2 oz (88.769 kg)  05/01/16 196 lb 8 oz (89.132 kg)  04/25/16 196 lb (88.905 kg)

## 2016-05-09 NOTE — Progress Notes (Signed)
  Radiation Oncology         [phone] ________________________________  Name: Jessica Petty MRN: [id number]  Date: 05/09/2016  DOB: 08-12-1976  Weekly Radiation Therapy Management    ICD-9-CM ICD-10-CM   1. Breast cancer of upper-inner quadrant of right female breast (HCC) 174.2 C50.211      Current Dose: 43.2 Gy     Planned Dose:  50.4 Gy  Narrative . . . . . . . . The patient presents for routine under treatment assessment.                                 Jessica Petty has completed 24 fractions to her right breast. She reports pain under her right arm that she is rating as a 4/10. She reports that she is fatigued. She is using radiaplex. The skin on her right breast and underarm is red with peeling over her nipple area.                                 Set-up films were reviewed.                                 The chart was checked. Physical Findings. . .  height is 5\' 1"  (1.549 m) and weight is 195 lb 11.2 oz (88.769 kg). Her oral temperature is 98.4 F (36.9 C). Her blood pressure is 133/88 and her pulse is 87. .The lungs are clear. The heart has a regular rhythm and rate. The right breast area shows erythema and dry desquamation. No moist desquamation. Impression . . . . . . . The patient is tolerating radiation. Plan . . . . . . . . . . . . Continue treatment as planned. She has been given hydrogel pads, by the nurse, to try.  ________________________________   Blair Promise, PhD, MD  This document serves as a record of services personally performed by Gery Pray, MD. It was created on his behalf by Darcus Austin, a trained medical scribe. The creation of this record is based on the scribe's personal observations and the provider's statements to them. This document has been checked and approved by the attending provider.

## 2016-05-10 ENCOUNTER — Ambulatory Visit
Admission: RE | Admit: 2016-05-10 | Discharge: 2016-05-10 | Disposition: A | Discharge status: Home / Self Care | Payer: BLUE CROSS/BLUE SHIELD | Source: Ambulatory Visit | Attending: Radiation Oncology | Admitting: Radiation Oncology

## 2016-05-10 DIAGNOSIS — Z510 Encounter for antineoplastic radiation therapy: Secondary | ICD-10-CM | POA: Diagnosis not present

## 2016-05-11 ENCOUNTER — Ambulatory Visit
Admission: RE | Admit: 2016-05-11 | Discharge: 2016-05-11 | Disposition: A | Discharge status: Home / Self Care | Payer: BLUE CROSS/BLUE SHIELD | Source: Ambulatory Visit | Attending: Radiation Oncology | Admitting: Radiation Oncology

## 2016-05-11 DIAGNOSIS — Z510 Encounter for antineoplastic radiation therapy: Secondary | ICD-10-CM | POA: Diagnosis not present

## 2016-05-14 ENCOUNTER — Ambulatory Visit
Admission: RE | Admit: 2016-05-14 | Discharge: 2016-05-14 | Disposition: A | Discharge status: Home / Self Care | Payer: BLUE CROSS/BLUE SHIELD | Source: Ambulatory Visit | Attending: Radiation Oncology | Admitting: Radiation Oncology

## 2016-05-14 DIAGNOSIS — Z510 Encounter for antineoplastic radiation therapy: Secondary | ICD-10-CM | POA: Diagnosis not present

## 2016-05-15 ENCOUNTER — Encounter: Payer: Self-pay | Admitting: Radiation Oncology

## 2016-05-15 ENCOUNTER — Ambulatory Visit
Admission: RE | Admit: 2016-05-15 | Discharge: 2016-05-15 | Disposition: A | Discharge status: Home / Self Care | Payer: BLUE CROSS/BLUE SHIELD | Source: Ambulatory Visit | Attending: Radiation Oncology | Admitting: Radiation Oncology

## 2016-05-15 VITALS — BP 136/87 | HR 97 | Temp 98.1°F | Resp 16 | Ht 61.0 in | Wt 195.2 lb

## 2016-05-15 DIAGNOSIS — Z510 Encounter for antineoplastic radiation therapy: Secondary | ICD-10-CM | POA: Diagnosis not present

## 2016-05-15 DIAGNOSIS — C50211 Malignant neoplasm of upper-inner quadrant of right female breast: Secondary | ICD-10-CM

## 2016-05-15 NOTE — Progress Notes (Signed)
Merriann Fogel has completed 28 fractions to her right breast.  She reports her right breast is tender.  She reports having fatigue in the mornings.  She is using radiaplex..  The skin on her right breast is red with dry peeling areas under her right arm and right breast.  She is using neosporin on the peeling areas.  She has been given a one month follow up appointment.  BP 136/87 mmHg  Pulse 97  Temp(Src) 98.1 F (36.7 C) (Oral)  Resp 16  Ht 5\' 1"  (1.549 m)  Wt 195 lb 3.2 oz (88.542 kg)  BMI 36.90 kg/m2   Wt Readings from Last 3 Encounters:  05/15/16 195 lb 3.2 oz (88.542 kg)  05/09/16 195 lb 11.2 oz (88.769 kg)  05/01/16 196 lb 8 oz (89.132 kg)

## 2016-05-15 NOTE — Progress Notes (Signed)
  Radiation Oncology         [phone] ________________________________  Name: Jessica Petty MRN: [id number]  Date: 05/15/2016  DOB: 03-11-1976  Weekly Radiation Therapy Management  Breast cancer of upper-inner quadrant of right female breast [facility])  Staging form: Breast, AJCC 7th Edition  Clinical stage from 08/17/2015: Stage IA (T1c, N0, M0) - Unsigned  Staging comments: Staged at breast conference on 9.7.16  Current Dose: 50.4 Gy     Planned Dose:  50.4 Gy  Narrative . . . . . . . . The patient presents for routine under treatment assessment.                                 Jessica Petty has completed 28 fractions to her right breast. She reports her right breast is tender. She reports having fatigue in the mornings. She is using radiaplex.. The skin on her right breast is red with dry peeling areas under her right arm and right breast. She is using neosporin on the peeling areas. She mentions she is feeling fatigued.                                 Set-up films were reviewed.                                 The chart was checked. Physical Findings. . .  height is 5\' 1"  (1.549 m) and weight is 195 lb 3.2 oz (88.542 kg). Her oral temperature is 98.1 F (36.7 C). Her blood pressure is 136/87 and her pulse is 97. Her respiration is 16. .The lungs are clear. The heart has a regular rhythm and rate. The right breast area shows erythema and dry desquamation. No moist desquamation. Impression . . . . . . . The patient is tolerating radiation. Plan . . . . . . . . . . . . She has completed treatment today. She has been given a one month follow up appointment.  ________________________________   Blair Promise, PhD, MD    This document serves as a record of services personally performed by Gery Pray, MD. It was created on his behalf by Lendon Collar, a trained medical scribe. The creation of this record is based on the scribe's personal observations and the provider's  statements to them. This document has been checked and approved by the attending provider.

## 2016-05-24 ENCOUNTER — Ambulatory Visit (HOSPITAL_BASED_OUTPATIENT_CLINIC_OR_DEPARTMENT_OTHER): Payer: BLUE CROSS/BLUE SHIELD | Admitting: Hematology and Oncology

## 2016-05-24 ENCOUNTER — Encounter: Payer: Self-pay | Admitting: *Deleted

## 2016-05-24 ENCOUNTER — Telehealth: Payer: Self-pay | Admitting: Hematology and Oncology

## 2016-05-24 ENCOUNTER — Encounter: Payer: Self-pay | Admitting: Hematology and Oncology

## 2016-05-24 VITALS — BP 129/81 | HR 95 | Temp 98.2°F | Resp 18 | Ht 61.0 in | Wt 197.4 lb

## 2016-05-24 DIAGNOSIS — G629 Polyneuropathy, unspecified: Secondary | ICD-10-CM

## 2016-05-24 DIAGNOSIS — C50211 Malignant neoplasm of upper-inner quadrant of right female breast: Secondary | ICD-10-CM | POA: Diagnosis not present

## 2016-05-24 DIAGNOSIS — Z170 Estrogen receptor positive status [ER+]: Secondary | ICD-10-CM

## 2016-05-24 MED ORDER — TAMOXIFEN CITRATE 20 MG PO TABS: 20.0000 mg | ORAL_TABLET | Freq: Every day | ORAL | Status: DC

## 2016-05-24 NOTE — Telephone Encounter (Signed)
Appt made and avs printed °

## 2016-05-24 NOTE — Assessment & Plan Note (Signed)
Right Lumpectomy: 09/14/2015 IDC Grade 2/3, 1.2 cm, LVI Present, 0/1 LN T1CN0 (Stage 1A) Oncotype DX 44, 30% ROR Oncotype DX counseling: I discussed the result of Oncotype DX scoring provided her with a copy of this report. Based upon her risk score of 44, with tamoxifen alone the risk of recurrence of 30%. This is very high risk and I recommended systemic chemotherapy. Adjuvant chemotherapy with dose dense Adriamycin and Cytoxan every 2 weeks 4 followed by Abraxane weekly 7 stopped early due to toxicities ( started 11/09/2015 completed 03/07/2016) Adjuvant radiation therapy completed 05/15/2016  Current treatment: Adjuvant antiestrogen therapy with tamoxifen 20 mg daily started 05/24/2016  Tamoxifen counseling: We discussed the risks and benefits of tamoxifen. These include but not limited to insomnia, hot flashes, mood changes, vaginal dryness, and weight gain. Although rare, serious side effects including endometrial cancer, risk of blood clots were also discussed. We strongly believe that the benefits far outweigh the risks. Patient understands these risks and consented to starting treatment. Planned treatment duration is 10 years.  Peripheral neuropathy: Grade 2  Return to clinic in 3 months for toxicity check and follow-up

## 2016-05-24 NOTE — Progress Notes (Signed)
Patient Care Team: No Pcp Per Patient as PCP - General (General Practice) Brien Few, MD as Consulting Physician (Obstetrics and Gynecology) Rolm Bookbinder, MD as Consulting Physician (General Surgery) Nicholas Lose, MD as Consulting Physician (Hematology and Oncology) Arloa Koh, MD as Consulting Physician (Radiation Oncology) Rockwell Germany, RN as Registered Nurse Mauro Kaufmann, RN as Registered Nurse Sylvan Cheese, NP as Nurse Practitioner (Nurse Practitioner)  DIAGNOSIS: Breast cancer of upper-inner quadrant of right female breast [facility])   Staging form: Breast, AJCC 7th Edition     Clinical stage from 08/17/2015: Stage IA (T1c, N0, M0) - Unsigned       Staging comments: Staged at breast conference on 9.7.16  SUMMARY OF ONCOLOGIC HISTORY:   Breast cancer of upper-inner quadrant of right female breast ([place])   08/10/2015 Mammogram Right breast mass 1.2 cm irregular spiculated   08/11/2015 Initial Diagnosis 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, grade 2-3   08/29/2015 Procedure  genetic testing normal (Genes tested includeATM, BARD1, BRCA1, BRCA2, BRIP1, CDH1, CHEK2, FANCC, MLH1, MSH2, MSH6, NBN, PALB2, PMS2, PTEN, RAD51C, RAD51D, TP53, and XRCC2)   09/14/2015 Surgery Right Lumpectomy: IDC Grade 2/3, 1.2 cm, LVI Present, 0/1 LN T1CN0 (Stage 1A) Oncotype DX 44, 30% ROR, ER 100%, PR 100%, HER-2/neu negative ratio 1.38, Ki-67 20%   09/20/2015 Procedure genetic testing revealed no pathogenic mutations   11/09/2015 - 02/22/2016 Chemotherapy adjuvant chemotherapy: Adriamycin and Cytoxan dose dense 4 followed by Abraxane weekly 7 (stopped early for toxicities)   04/05/2016 - 05/15/2016 Radiation Therapy Adj XRT    CHIEF COMPLIANT: Follow-up after radiation is complete  INTERVAL HISTORY: Jessica Petty is a 40 year old with above-mentioned history of right breast cancer treated with lumpectomy and adjuvant chemotherapy and radiation and is here today to discuss  starting after an antiestrogen therapy. 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  REVIEW OF SYSTEMS:   Constitutional: Denies fevers, chills or abnormal weight loss Eyes: Denies blurriness of vision Ears, nose, mouth, throat, and face: Denies mucositis or sore throat Respiratory: Denies cough, dyspnea or wheezes Cardiovascular: Denies palpitation, chest discomfort Gastrointestinal:  Denies nausea, heartburn or change in bowel habits Skin: Denies abnormal skin rashes Lymphatics: Denies new lymphadenopathy or easy bruising Neurological:Denies numbness, tingling or new weaknesses Behavioral/Psych: Mood is stable, no new changes  Extremities: No lower extremity edema Breast: Radiation dermatitis  All other systems were reviewed with the patient and are negative.  I have reviewed the past medical history, past surgical history, social history and family history with the patient and they are unchanged from previous note.  ALLERGIES:  is allergic to tamiflu.  MEDICATIONS:  Current Outpatient Prescriptions  Medication Sig Dispense Refill  . ADDERALL XR 20 MG 24 hr capsule Take 20 mg by mouth.  0  . ALPRAZolam (XANAX) 1 MG tablet Take 1 mg by mouth 3 (three) times daily as needed.  5  . ARIPiprazole (ABILIFY) 10 MG tablet Take 10 mg by mouth at bedtime.  11  . HYDROcodone-acetaminophen (NORCO) 10-325 MG tablet Take 1 tablet by mouth every 6 (six) hours as needed. (Patient not taking: Reported on 02/29/2016) 20 tablet 0  . lidocaine-prilocaine (EMLA) cream Apply to affected area once 30 g 3  . oxyCODONE-acetaminophen (PERCOCET) 10-325 MG tablet Take 1 tablet by mouth every 6 (six) hours as needed for pain. (Patient not taking: Reported on 02/29/2016) 20 tablet 0   No current facility-administered medications for this visit.   Facility-Administered  Medications Ordered in Other Visits  Medication Dose Route Frequency Provider Last Rate Last Dose  . sodium  chloride 0.9 % injection 10 mL  10 mL Intracatheter PRN Nicholas Lose, MD   10 mL at 01/11/16 1103    PHYSICAL EXAMINATION: ECOG PERFORMANCE STATUS: 1 - Symptomatic but completely ambulatory  Filed Vitals:   05/24/16 0828  BP: 129/81  Pulse: 95  Temp: 98.2 F (36.8 C)  Resp: 18   Filed Weights   05/24/16 0828  Weight: 197 lb 6.4 oz (89.54 kg)    GENERAL:alert, no distress and comfortable SKIN: skin color, texture, turgor are normal, no rashes or significant lesions EYES: normal, Conjunctiva are pink and non-injected, sclera clear OROPHARYNX:no exudate, no erythema and lips, buccal mucosa, and tongue normal  NECK: supple, thyroid normal size, non-tender, without nodularity LYMPH:  no palpable lymphadenopathy in the cervical, axillary or inguinal LUNGS: clear to auscultation and percussion with normal breathing effort HEART: regular rate & rhythm and no murmurs and no lower extremity edema ABDOMEN:abdomen soft, non-tender and normal bowel sounds MUSCULOSKELETAL:no cyanosis of digits and no clubbing  NEURO: alert & oriented x 3 with fluent speech, no focal motor/sensory deficits EXTREMITIES: No lower extremity edema  LABORATORY DATA:  I have reviewed the data as listed   Chemistry      Component Value Date/Time   NA 140 03/07/2016 0800   K 4.0 03/07/2016 0800   CO2 26 03/07/2016 0800   BUN 9.8 03/07/2016 0800   CREATININE 0.7 03/07/2016 0800      Component Value Date/Time   CALCIUM 9.4 03/07/2016 0800   ALKPHOS 68 03/07/2016 0800   AST 21 03/07/2016 0800   ALT 27 03/07/2016 0800   BILITOT 0.36 03/07/2016 0800       Lab Results  Component Value Date   WBC 4.7 03/07/2016   HGB 12.8 03/07/2016   HCT 37.2 03/07/2016   MCV 82.7 03/07/2016   PLT 253 03/07/2016   NEUTROABS 3.4 03/07/2016     ASSESSMENT & PLAN:  Breast cancer of upper-inner quadrant of right female breast (HCC) Right Lumpectomy: 09/14/2015 IDC Grade 2/3, 1.2 cm, LVI Present, 0/1 LN T1CN0 (Stage  1A) Oncotype DX 44, 30% ROR Oncotype DX counseling: I discussed the result of Oncotype DX scoring provided her with a copy of this report. Based upon her risk score of 44, with tamoxifen alone the risk of recurrence of 30%. This is very high risk and I recommended systemic chemotherapy. Adjuvant chemotherapy with dose dense Adriamycin and Cytoxan every 2 weeks 4 followed by Abraxane weekly 7 stopped early due to toxicities ( started 11/09/2015 completed 03/07/2016) Adjuvant radiation therapy completed 05/15/2016  Current treatment: Adjuvant antiestrogen therapy with tamoxifen 20 mg daily to start 06/13/2016  Tamoxifen counseling: We discussed the risks and benefits of tamoxifen. These include but not limited to insomnia, hot flashes, mood changes, vaginal dryness, and weight gain. Although rare, serious side effects including endometrial cancer, risk of blood clots were also discussed. We strongly believe that the benefits far outweigh the risks. Patient understands these risks and consented to starting treatment. Planned treatment duration is 10 years.  Peripheral neuropathy: Grade 2  Return to clinic in 4 months for toxicity check and follow-up  No orders of the defined types were placed in this encounter.   The patient has a good understanding of the overall plan. she agrees with it. she will call with any problems that may develop before the next visit here.   Nicholas Lose  K, MD 05/24/2016

## 2016-05-24 NOTE — Progress Notes (Signed)
  Radiation Oncology         [phone] ________________________________  Name: Jessica Petty MRN: [id number]  Date: 05/15/2016  DOB: 1976/07/18  End of Treatment Note   ICD-9-CM ICD-10-CM    1. Breast cancer of upper-inner quadrant of right female breast ([place]) 174.2 C50.211     DIAGNOSIS: Breast cancer of upper-inner quadrant of right female breast [facility])  Staging form: Breast, AJCC 7th Edition  Clinical stage from 08/17/2015: Stage IA (T1c, N0, M0) - Unsigned (pT1c, pN0)       Indication for treatment:  Curative       Radiation treatment dates:   04/05/2016-05/15/2016  Site/dose:   Right breast treated to 50.4 Gy in 28 fractions   Beams/energy:   Conformal/ 10X, 6X  Narrative: The patient tolerated radiation treatment relatively well. Throughout treatment she commonly had right breast erythema and dry desquamation with occasional pains associated to these radiation effects. She managed this with Radiaplex and Neosporin.   Plan: The patient has completed radiation treatment. The patient will return to radiation oncology clinic for routine followup in one month. I advised them to call or return sooner if they have any questions or concerns related to their recovery or treatment.  -----------------------------------  Blair Promise, PhD, MD  This document serves as a record of services personally performed by Gery Pray, MD. It was created on his behalf by Derek Mound, a trained medical scribe. The creation of this record is based on the scribe's personal observations and the provider's statements to them. This document has been checked and approved by the attending provider.

## 2016-06-29 ENCOUNTER — Other Ambulatory Visit: Payer: Self-pay | Admitting: *Deleted

## 2016-06-29 DIAGNOSIS — C50211 Malignant neoplasm of upper-inner quadrant of right female breast: Secondary | ICD-10-CM

## 2016-07-02 ENCOUNTER — Telehealth: Payer: Self-pay | Admitting: Hematology and Oncology

## 2016-07-02 NOTE — Telephone Encounter (Signed)
Spoke with patient. Appointment confirmed. Jessica F. °

## 2016-07-05 ENCOUNTER — Ambulatory Visit: Payer: Self-pay | Admitting: Radiation Oncology

## 2016-08-06 ENCOUNTER — Encounter: Payer: Self-pay | Admitting: Oncology

## 2016-08-09 ENCOUNTER — Encounter: Payer: Self-pay | Admitting: Radiation Oncology

## 2016-08-09 ENCOUNTER — Ambulatory Visit
Admission: RE | Admit: 2016-08-09 | Discharge: 2016-08-09 | Disposition: A | Discharge status: Home / Self Care | Payer: BLUE CROSS/BLUE SHIELD | Source: Ambulatory Visit | Attending: Radiation Oncology | Admitting: Radiation Oncology

## 2016-08-09 VITALS — BP 130/87 | HR 84 | Temp 98.2°F | Ht 61.0 in | Wt 189.4 lb

## 2016-08-09 DIAGNOSIS — Z170 Estrogen receptor positive status [ER+]: Secondary | ICD-10-CM | POA: Insufficient documentation

## 2016-08-09 DIAGNOSIS — C50211 Malignant neoplasm of upper-inner quadrant of right female breast: Secondary | ICD-10-CM

## 2016-08-09 NOTE — Progress Notes (Signed)
Jessica Petty is here for follow up.  She reports having some stiffness and reduced range of motion in her right shoulder.  She reports her energy level is better.  She is taking tamoxifen.  The skin on her right breast has slight hyperpigmentation.  BP 130/87 (BP Location: Left Arm, Patient Position: Sitting)   Pulse 84   Temp 98.2 F (36.8 C) (Oral)   Ht 5\' 1"  (1.549 m)   Wt 189 lb 6.4 oz (85.9 kg)   SpO2 100%   BMI 35.79 kg/m    Wt Readings from Last 3 Encounters:  08/09/16 189 lb 6.4 oz (85.9 kg)  05/24/16 197 lb 6.4 oz (89.5 kg)  05/15/16 195 lb 3.2 oz (88.5 kg)

## 2016-08-09 NOTE — Addendum Note (Signed)
Encounter addended by: Jacqulyn Liner, RN on: 08/09/2016  8:51 AM<BR>    Actions taken: Charge Capture section accepted

## 2016-08-09 NOTE — Progress Notes (Signed)
Radiation Oncology         [phone] ________________________________  Name: Jessica Petty MRN: [id number]  Date: 08/09/2016  DOB: 05-30-1976  Follow-Up Visit Note  CC: No PCP Per Patient  Jessica Bookbinder, Petty    ICD-9-CM ICD-10-CM   1. 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 The patient is on Tamoxifen 20 mg daily since 06/13/16.  On review of systems: She reports having some stiffness and reduced range of motion in her right shoulder. She reports her energy level is better. She denies nipple discharge or bleeding. She reports having a general body ache the first week taking Tamoxifen, but this has since subsided. She denies hot flashes. She reports not having periods since completing chemotherapy.  ALLERGIES:  is allergic to tamiflu [oseltamivir phosphate].  Meds: Current Outpatient Prescriptions  Medication Sig Dispense Refill  . ADDERALL XR 20 MG 24 hr capsule Take 20 mg by mouth.  0  . ALPRAZolam (XANAX) 1 MG tablet Take 1 mg by mouth 3 (three) times daily as needed.  5  . ARIPiprazole (ABILIFY) 10 MG tablet Take 10 mg by mouth at bedtime.  11  . tamoxifen (NOLVADEX) 20 MG tablet Take 1 tablet (20 mg total) by mouth daily. 90 tablet 3   No current facility-administered medications for this encounter.    Facility-Administered Medications Ordered in Other Encounters  Medication Dose Route Frequency Provider Last Rate Last Dose  . sodium chloride 0.9 % injection 10 mL  10 mL Intracatheter PRN Jessica Petty   10 mL at 01/11/16 1103    Physical Findings: The patient is in no acute distress. Patient is alert and oriented.  height is _0  (1.549 m) and  weight is 189 lb 6.4 oz (85.9 kg). Her oral temperature is 98.2 F (36.8 C). Her blood pressure is 130/87 and her pulse is 84. Her oxygen saturation is 100%.   Lungs are clear to auscultation bilaterally. Heart has regular rate and rhythm. No palpable cervical, supraclavicular, or axillary adenopathy. Hyperpigmentation changes of the right breast. No dominant mass or nipple discharge/bleeding. Left breast noted for no dominant mass, nipple discharge/bleeding.  Lab Findings: Lab Results  Component Value Date   WBC 4.7 03/07/2016   HGB 12.8 03/07/2016   HCT 37.2 03/07/2016   MCV 82.7 03/07/2016   PLT 253 03/07/2016    Radiographic Findings: No results found.  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  Plan: I offered the patient physical therapy for her arm, but she has politely declined since she is doing exercises for it at home. The patient is scheduled for Survivorship on 08/29/16 and will return to Jessica Petty on 09/24/16. She will PRN follow up with radiation oncology. She will continue follow-up with Dr. Donne Petty. It has been a pleasure treating this patient. ____________________________________ -----------------------------------  Jessica Promise, PhD, Petty  This document serves as a record of services personally performed by Jessica Pray, Petty. It was created on his behalf by Jessica Petty, a trained medical scribe. The creation of this record is based on the scribe's personal observations  and the provider's statements to them. This document has been checked and approved by the attending provider.

## 2016-08-29 ENCOUNTER — Telehealth: Payer: Self-pay | Admitting: *Deleted

## 2016-08-29 ENCOUNTER — Encounter: Payer: Self-pay | Admitting: Adult Health

## 2016-08-29 ENCOUNTER — Ambulatory Visit (HOSPITAL_BASED_OUTPATIENT_CLINIC_OR_DEPARTMENT_OTHER): Payer: BLUE CROSS/BLUE SHIELD | Admitting: Adult Health

## 2016-08-29 VITALS — BP 143/92 | HR 86 | Temp 97.8°F | Resp 18 | Wt 191.3 lb

## 2016-08-29 DIAGNOSIS — Z79810 Long term (current) use of selective estrogen receptor modulators (SERMs): Secondary | ICD-10-CM

## 2016-08-29 DIAGNOSIS — C50211 Malignant neoplasm of upper-inner quadrant of right female breast: Secondary | ICD-10-CM | POA: Diagnosis not present

## 2016-08-29 DIAGNOSIS — Z170 Estrogen receptor positive status [ER+]: Secondary | ICD-10-CM | POA: Diagnosis not present

## 2016-08-29 NOTE — Telephone Encounter (Signed)
Called pt to inform her of appt for mammo at [facility] which is Oct 5th at 9:15a. Told pt to arrive at 9:00. Of course reminded her to wear no lotions, deodorant,perfumes or powders. Message to be fwd to G.Dawson,NP

## 2016-08-29 NOTE — Progress Notes (Signed)
CLINIC:  Survivorship   REASON FOR VISIT:  Routine follow-up post-treatment for a recent history of breast cancer.  BRIEF ONCOLOGIC HISTORY:    Breast cancer of upper-inner quadrant of right female breast ([place])   08/10/2015 Mammogram    Right breast mass 1.2 cm irregular spiculated      08/11/2015 Initial Diagnosis    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, grade 2-3      08/18/2015 Procedure    Genetic testing normal (Genes tested include: ATM, BARD1, BRCA1, BRCA2, BRIP1, CDH1, CHEK2, FANCC, MLH1, MSH2, MSH6, NBN, PALB2, PMS2, PTEN, RAD51C, RAD51D, TP53, and XRCC2.  This panel also includes deletion/duplication analysis (without sequencing) for one gene, EPCAM)      09/14/2015 Surgery    Right lumpectomy Jessica Petty): IDC Grade 2/3, 1.2 cm, LVI Present, 0/1 LN T1CN0 (Stage 1A) Oncotype DX 44, 30% ROR, ER 100%, PR 100%, HER-2/neu negative ratio 1.38, Ki-67 20%      09/14/2015 Oncotype testing    RS 44; ROR 30% (high-risk)      10/12/2015 Surgery    Re-excision right breast inferior margins Jessica Petty). Path negative; margins negative.       11/09/2015 - 02/22/2016 Chemotherapy    adjuvant chemotherapy: Adriamycin and Cytoxan dose dense 4 followed by Abraxane weekly 7 (stopped early for toxicities)      04/05/2016 - 05/15/2016 Radiation Therapy    Adj XRT ([place]). Right breast treated to 50.4 Gy in 28 fractions       06/2016 -  Anti-estrogen oral therapy    Tamoxifen 20 mg daily. Planned duration of treatment: 10 years.        INTERVAL HISTORY:  Jessica Petty presents to the [facility] today for our initial meeting to review her survivorship care plan detailing her treatment course for breast cancer, as well as monitoring long-term side effects of that treatment, education regarding health maintenance, screening, and overall wellness and health promotion.     Overall, Ms. Wiemann reports feeling quite well since completing her radiation therapy  approximately 3 months ago.  She started the Tamoxifen in 06/2016 and is tolerating it very well; "I don't even know I'm taking anything."  Denies hot flashes or vaginal bleeding. She recently had her IUD removed and her menstrual cycle has not resumed.  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  She has occasional right shoulder pain with movement.  She denies any breast pain; her skin has healed well since completing radiation. Appetite and energy levels are improved.  She has problems with her memory since finishing chemo; "I have ADD, but this is different; I can't remember anything, but I just put post-it notes around my house as reminders."   She is thinking about rejoining her gym; she states "I finally feel like my energy levels are good enough that I can start doing some work outs again."      REVIEW OF SYSTEMS:  Review of Systems  Constitutional: Negative.   HENT: Negative.   Eyes: Negative.   Respiratory: Negative.   Cardiovascular: Negative.   Gastrointestinal: Negative.   Genitourinary: Negative.   Musculoskeletal:       (R) shoulder pain/decreased mobility at times.   Skin: Negative.   Neurological: Negative.   Endo/Heme/Allergies: Negative.   Psychiatric/Behavioral: Negative.   GU: Denies vaginal bleeding, discharge, or dryness.  Breast: Denies any new nodularity, masses, tenderness, nipple changes, or nipple discharge.    A 14-point review of systems was completed and was  negative, except as noted above.   ONCOLOGY TREATMENT TEAM:  1. Surgeon:  Dr. Donne Petty at [facility] 2. Medical Oncologist: Dr. Lindi Petty 3. Radiation Oncologist: Dr. Sondra Petty    PAST MEDICAL/SURGICAL HISTORY:  Past Medical History:  Diagnosis Date  . ADHD (attention deficit hyperactivity disorder)   . Anxiety   . Breast cancer ([place])   . Radiation 04/05/16-05/15/16   right breast 50.4 Gy   Past Surgical History:  Procedure Laterality Date  . PORTACATH  PLACEMENT N/A 10/12/2015   Procedure: INSERTION PORT-A-CATH WITH ULTRASOUND;  Surgeon: Rolm Bookbinder, MD;  Location: [facility];  Service: General;  Laterality: N/A;  . RADIOACTIVE SEED GUIDED MASTECTOMY WITH AXILLARY SENTINEL LYMPH NODE BIOPSY Right 09/14/2015   Procedure: RADIOACTIVE SEED GUIDED PARTIAL MASTECTOMY WITH AXILLARY SENTINEL LYMPH NODE BIOPSY;  Surgeon: Rolm Bookbinder, MD;  Location: [facility];  Service: General;  Laterality: Right;  . RE-EXCISION OF BREAST LUMPECTOMY Right 10/12/2015   Procedure: RE-EXCISION MARGIN RIGHT BREAST;  Surgeon: Rolm Bookbinder, MD;  Location: [facility];  Service: General;  Laterality: Right;  . WISDOM TOOTH EXTRACTION       ALLERGIES:  Allergies  Allergen Reactions  . Tamiflu [Oseltamivir Phosphate] Nausea And Vomiting     CURRENT MEDICATIONS:  Outpatient Encounter Prescriptions as of 08/29/2016  Medication Sig Note  . ADDERALL XR 20 MG 24 hr capsule Take 20 mg by mouth. 08/17/2015: Received from: External Pharmacy Received Sig: TAKE 2 CAPS BY MOUTH 2 TIMES DAILY  . ALPRAZolam (XANAX) 1 MG tablet Take 1 mg by mouth 3 (three) times daily as needed. 08/17/2015: Received from: External Pharmacy  . ARIPiprazole (ABILIFY) 10 MG tablet Take 10 mg by mouth at bedtime. 08/17/2015: Received from: External Pharmacy Received Sig:   . tamoxifen (NOLVADEX) 20 MG tablet Take 1 tablet (20 mg total) by mouth daily.    Facility-Administered Encounter Medications as of 08/29/2016  Medication  . sodium chloride 0.9 % injection 10 mL     ONCOLOGIC FAMILY HISTORY:  Family History  Problem Relation Age of Onset  . Liver cancer Maternal Aunt 62    terminal; not a drinker  . Lung cancer Maternal Uncle 47    former smoker  . Ovarian cancer Paternal Aunt 19    metastasis to cervix and other areas  . Other Sister     "couple of benign cervical biopsies"  . Stroke Maternal Grandmother   . Heart Problems Maternal  Grandmother   . Heart Problems Maternal Grandfather   . Heart attack Paternal Grandmother      GENETIC COUNSELING/TESTING: 08/18/15-Genetic testing normal (Genes tested include: ATM, BARD1, BRCA1, BRCA2, BRIP1, CDH1, CHEK2, FANCC, MLH1, MSH2, MSH6, NBN, PALB2, PMS2, PTEN, RAD51C, RAD51D, TP53, and XRCC2.  This panel also includes deletion/duplication analysis (without sequencing) for one gene, EPCAM).  SOCIAL HISTORY:  TRUDE CANSLER is single and lives in [place], [place].  She has 1 daughter who is 37 years old and a Paramedic at [employer].  Ms. Donahoo currently works for a [employer]; she was hoping to go to law school before being diagnosed with cancer.  She is a former social smoker. She drinks alcohol daily "at least 1 beer when I get home from work."  Denies illicit drug use.    PHYSICAL EXAMINATION:  Vital Signs:   Vitals:   08/29/16 1008  BP: (!) 143/92  Pulse: 86  Resp: 18  Temp: 97.8 F (36.6 C)   Filed Weights  08/29/16 1008  Weight: 191 lb 4.8 oz (86.8 kg)   General: Well-nourished, well-appearing female in no acute distress.  She is unaccompanied today.   HEENT: Head is normocephalic.  Pupils equal and reactive to light. Conjunctivae clear without exudate.  Sclerae anicteric. Oral mucosa is pink, moist.  Oropharynx is pink without lesions or erythema.  Lymph: No cervical, supraclavicular, or infraclavicular lymphadenopathy noted on palpation.  Cardiovascular: Regular rate and rhythm.Marland Kitchen Respiratory: Clear to auscultation bilaterally. Chest expansion symmetric; breathing non-labored.  GI: Abdomen soft and round; non-tender, non-distended. Bowel sounds normoactive.  GU: Deferred.  Neuro: No focal deficits. Steady gait.  Psych: Mood and affect normal and appropriate for situation.  Extremities: No edema. Skin: Warm and dry.  LABORATORY DATA:  None for this visit.  DIAGNOSTIC IMAGING:  None for this visit.      ASSESSMENT AND PLAN:  Ms.. Jessica Petty is a  pleasant 40 y.o. female with Stage IA right breast invasive ductal carcinoma, ER+/PR+/HER2-, diagnosed in 08/2015, treated with lumpectomy, adjuvant chemotherapy, with Adriamycin/Cytoxan x 4, then Taxol x 7 which was stopped early due to side effects.  She went on to have adjuvant radiation therapy and anti-estrogen therapy with Tamoxifen beginning in 06/2016. Planned duration of anti-estrogen therapy is 10 years.  She presents to the Survivorship Clinic for our initial meeting and routine follow-up post-completion of treatment for breast cancer.    1. Stage IA right breast cancer:  Ms. Noorani is continuing to recover from definitive treatment for breast cancer.  She is due for her first post-treatment mammogram; orders placed today to try to coordinate to have imaging done before she sees Dr. Lindi Petty in mid-October.  She will continue to follow-up with Dr. Lindi Petty with history and physical exam per surveillance protocol.  She will continue her anti-estrogen therapy with Tamoxifen. Thus far, she is tolerating the medication very well, with largely no side effects. She was instructed to make Dr. Lindi Petty or myself aware if she begins to experience any worsening side effects of the medication and I could see her back in clinic to help manage those side effects, as needed. Though the incidence is low, there is an associated risk of endometrial cancer with anti-estrogen therapies like Tamoxifen.  Ms. Sunderland was encouraged to contact Dr. Lindi Petty or myself with any abnormal vaginal bleeding while taking Tamoxifen. Other side effects of Tamoxifen were again reviewed with her as well. Today, a comprehensive survivorship care plan and treatment summary was reviewed with the patient today detailing her breast cancer diagnosis, treatment course, potential late/long-term effects of treatment, appropriate follow-up care with recommendations for the future, and patient education resources.  A copy of this summary, along with a  letter will be sent to the patient's primary care provider via mail/fax/In Basket message after today's visit.    2. Contraception while taking Tamoxifen: We discussed that time will tell if her menstrual cycles will return since completing chemotherapy.  She recently had her IUD removed.  We reviewed that often chemotherapy affects ovulation and can certainly lead to infertility in some women, but this is not a concern for her as she expressed not wanting to have any more children.  We discussed that just because her periods have not returned, does not mean she is infertile.  She should use some type of barrier contraception, particularly while taking Tamoxifen, as it carries high-risk of teratogenic side effects.  She voiced understanding.   3. Cognitive dysfunction s/p chemo:  Chemo brain is a real phenomenon experienced  by as much as 25% of oncology patients.  So far, she feels like she is compensating well with using reminder notes.  She states that her focus/attention are improving, but they are still not back to her baseline (even with the ADD).  We discussed that it is often difficult to discern the difference between chemo brain and recovery from the trauma of a cancer diagnosis and subsequent treatments.  Her symptoms are improving, which is encouraging.  We discussed referral for neuropsychologic testing, but she is not interested at this time. "I do not want any more appointments or tests than I have already had."  This is certainly reasonable and I encouraged her to call me if she changes her mind and I would be happy to place the referral, if needed.   4. Bone health:  Given Ms. Henault's history of breast cancer, she is at risk for bone demineralization.  We reviewed that often Tamoxifen has the positive side effect of bone strengthening, when compared to aromatase inhibitors.   Encouraged to increase her consumption of foods rich in calcium, as well as increase her weight-bearing activities.   She was given education on specific activities to promote bone health.  5. Cancer screening:  Due to Ms. Gallion's history and her age, she should receive screening for skin cancers and gynecologic cancers.  She does not have a PCP; encouraged her to find one that is accepting new patients to have for routine visits, as well as follow-ups.  The information and recommendations are listed on the patient's comprehensive care plan/treatment summary and were reviewed in detail with the patient.    6. Health maintenance and wellness promotion: Ms. Mcdevitt was offered her flu vaccine today, but declined.  She is still trying to decide if she wants to have it this year or not, because the vaccine always makes her feel so sick.  Encouraged her to think about it, as we definitely recommend that our patients get vaccinated for the flu each year.  I let her know that we would be happy to give it to her here at the cancer center in the future, if she changes her mind.  She was also encouraged to consume 5-7 servings of fruits and vegetables per day. We reviewed the "Nutrition Rainbow" handout.  She was also encouraged to engage in moderate to vigorous exercise for 30 minutes per day most days of the week. We discussed the [employer] fitness program, which is designed for cancer survivors to help them become more physically fit after cancer treatments.  She was instructed to limit her alcohol consumption and continue to abstain from tobacco use.   7. Support services/counseling: It is not uncommon for this period of the patient's cancer care trajectory to be one of many emotions and stressors.  We discussed an opportunity for her to participate in the next session of [facility] ("Finding Your New Normal") support group series designed for patients after they have completed treatment.   Ms. Raftery was encouraged to take advantage of our many other support services programs, support groups, and/or counseling in coping with  her new life as a cancer survivor after completing anti-cancer treatment.  She was offered support today through active listening and expressive supportive counseling.  She was given information regarding our available services and encouraged to contact me with any questions or for help enrolling in any of our support group/programs.    Dispo:   -First mammogram post-treatment ordered today; to be completed at [facility]  Mammography prior to follow-up with Dr. Lindi Petty.  -Return to cancer center to see Dr. Lindi Petty on 09/24/16. -She is welcome to return back to the Survivorship Clinic at any time; no additional follow-up needed at this time.  -Consider referral back to survivorship as a long-term survivor for continued surveillance  A total of 35 minutes of face-to-face time was spent with this patient with greater than 50% of that time in counseling and care-coordination.   Mike Craze, NP Survivorship Program [place] [phone]   Note: PRIMARY CARE PROVIDER No PCP Per Patient None None

## 2016-09-11 LAB — HM PAP SMEAR

## 2016-09-18 LAB — HM MAMMOGRAPHY

## 2016-09-24 ENCOUNTER — Ambulatory Visit (HOSPITAL_BASED_OUTPATIENT_CLINIC_OR_DEPARTMENT_OTHER): Payer: BLUE CROSS/BLUE SHIELD | Admitting: Hematology and Oncology

## 2016-09-24 ENCOUNTER — Encounter: Payer: Self-pay | Admitting: Hematology and Oncology

## 2016-09-24 DIAGNOSIS — Z79810 Long term (current) use of selective estrogen receptor modulators (SERMs): Secondary | ICD-10-CM

## 2016-09-24 DIAGNOSIS — C50211 Malignant neoplasm of upper-inner quadrant of right female breast: Secondary | ICD-10-CM

## 2016-09-24 DIAGNOSIS — Z170 Estrogen receptor positive status [ER+]: Secondary | ICD-10-CM | POA: Diagnosis not present

## 2016-09-24 NOTE — Progress Notes (Signed)
Patient Care Team: No Pcp Per Patient as PCP - General (General Practice) Brien Few, MD as Consulting Physician (Obstetrics and Gynecology) Rolm Bookbinder, MD as Consulting Physician (General Surgery) Nicholas Lose, MD as Consulting Physician (Hematology and Oncology) Arloa Koh, MD as Consulting Physician (Radiation Oncology) Rockwell Germany, RN as Registered Nurse Mauro Kaufmann, RN as Registered Nurse Sylvan Cheese, NP as Nurse Practitioner (Nurse Practitioner)  DIAGNOSIS: Breast cancer of upper-inner quadrant of right female breast [facility])   Staging form: Breast, AJCC 7th Edition   - Clinical stage from 08/17/2015: Stage IA (T1c, N0, M0) - Unsigned         Staging comments: Staged at breast conference on 9.7.16  SUMMARY OF ONCOLOGIC HISTORY:   Breast cancer of upper-inner quadrant of right female breast ([place])   08/10/2015 Mammogram    Right breast mass 1.2 cm irregular spiculated      08/11/2015 Initial Diagnosis    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, grade 2-3      08/18/2015 Procedure    Genetic testing normal (Genes tested include: ATM, BARD1, BRCA1, BRCA2, BRIP1, CDH1, CHEK2, FANCC, MLH1, MSH2, MSH6, NBN, PALB2, PMS2, PTEN, RAD51C, RAD51D, TP53, and XRCC2.  This panel also includes deletion/duplication analysis (without sequencing) for one gene, EPCAM)      09/14/2015 Surgery    Right lumpectomy Donne Hazel): IDC Grade 2/3, 1.2 cm, LVI Present, 0/1 LN T1CN0 (Stage 1A) Oncotype DX 44, 30% ROR, ER 100%, PR 100%, HER-2/neu negative ratio 1.38, Ki-67 20%      09/14/2015 Oncotype testing    RS 44; ROR 30% (high-risk)      10/12/2015 Surgery    Re-excision right breast inferior margins Donne Hazel). Path negative; margins negative.       11/09/2015 - 02/22/2016 Chemotherapy    adjuvant chemotherapy: Adriamycin and Cytoxan dose dense 4 followed by Abraxane weekly 7 (stopped early for toxicities)      04/05/2016 - 05/15/2016 Radiation  Therapy    Adj XRT ([place]). Right breast treated to 50.4 Gy in 28 fractions       06/2016 -  Anti-estrogen oral therapy    Tamoxifen 20 mg daily. Planned duration of treatment: 10 years.        CHIEF COMPLIANT: Follow-up on tamoxifen therapy  INTERVAL HISTORY: Jessica Petty is a 40 year old with above-mentioned history of right breast cancer treated with right lumpectomy followed by adjuvant chemotherapy followed by radiation and is currently on tamoxifen therapy. She has been tolerating tamoxifen extremely well. She does not have any hot flashes or myalgias. She is planning to join [employer] to get on an exercise regimen. She is trying to lose weight as well. She tells me that she had an abnormal Pap smear and has no further follow up with her gynecologist.  REVIEW OF SYSTEMS:   Constitutional: Denies fevers, chills or abnormal weight loss Eyes: Denies blurriness of vision Ears, nose, mouth, throat, and face: Denies mucositis or sore throat Respiratory: Denies cough, dyspnea or wheezes Cardiovascular: Denies palpitation, chest discomfort Gastrointestinal:  Denies nausea, heartburn or change in bowel habits Skin: Denies abnormal skin rashes Lymphatics: Denies new lymphadenopathy or easy bruising Neurological:Denies numbness, tingling or new weaknesses Behavioral/Psych: Mood is stable, no new changes  Extremities: No lower extremity edema Breast:  denies any pain or lumps or nodules in either breasts All other systems were reviewed with the patient and are negative.  I have reviewed the past medical history, past surgical history, social history and  family history with the patient and they are unchanged from previous note.  ALLERGIES:  is allergic to tamiflu [oseltamivir phosphate].  MEDICATIONS:  Current Outpatient Prescriptions  Medication Sig Dispense Refill  . ADDERALL XR 20 MG 24 hr capsule Take 20 mg by mouth. Pt takes 2-53m tablets in am and 2-20 mg tablets in pm.   0  . ALPRAZolam (XANAX) 1 MG tablet Take 1 mg by mouth 3 (three) times daily as needed.  5  . ARIPiprazole (ABILIFY) 10 MG tablet Take 10 mg by mouth at bedtime.  11  . tamoxifen (NOLVADEX) 20 MG tablet Take 1 tablet (20 mg total) by mouth daily. 90 tablet 3   No current facility-administered medications for this visit.    Facility-Administered Medications Ordered in Other Visits  Medication Dose Route Frequency Provider Last Rate Last Dose  . sodium chloride 0.9 % injection 10 mL  10 mL Intracatheter PRN VNicholas Lose MD   10 mL at 01/11/16 1103    PHYSICAL EXAMINATION: ECOG PERFORMANCE STATUS: 1 - Symptomatic but completely ambulatory  Vitals:   09/24/16 0819  BP: (!) 143/90  Pulse: 100  Resp: 18  Temp: 98.2 F (36.8 C)   Filed Weights   09/24/16 0819  Weight: 191 lb (86.6 kg)    GENERAL:alert, no distress and comfortable SKIN: skin color, texture, turgor are normal, no rashes or significant lesions EYES: normal, Conjunctiva are pink and non-injected, sclera clear OROPHARYNX:no exudate, no erythema and lips, buccal mucosa, and tongue normal  NECK: supple, thyroid normal size, non-tender, without nodularity LYMPH:  no palpable lymphadenopathy in the cervical, axillary or inguinal LUNGS: clear to auscultation and percussion with normal breathing effort HEART: regular rate & rhythm and no murmurs and no lower extremity edema ABDOMEN:abdomen soft, non-tender and normal bowel sounds MUSCULOSKELETAL:no cyanosis of digits and no clubbing  NEURO: alert & oriented x 3 with fluent speech, no focal motor/sensory deficits EXTREMITIES: No lower extremity edema  LABORATORY DATA:  I have reviewed the data as listed   Chemistry      Component Value Date/Time   NA 140 03/07/2016 0800   K 4.0 03/07/2016 0800   CO2 26 03/07/2016 0800   BUN 9.8 03/07/2016 0800   CREATININE 0.7 03/07/2016 0800      Component Value Date/Time   CALCIUM 9.4 03/07/2016 0800   ALKPHOS 68 03/07/2016 0800    AST 21 03/07/2016 0800   ALT 27 03/07/2016 0800   BILITOT 0.36 03/07/2016 0800       Lab Results  Component Value Date   WBC 4.7 03/07/2016   HGB 12.8 03/07/2016   HCT 37.2 03/07/2016   MCV 82.7 03/07/2016   PLT 253 03/07/2016   NEUTROABS 3.4 03/07/2016     ASSESSMENT & PLAN:  Breast cancer of upper-inner quadrant of right female breast ([facility]) Right Lumpectomy: 09/14/2015 IDC Grade 2/3, 1.2 cm, LVI Present, 0/1 LN T1CN0 (Stage 1A) Oncotype DX 44, 30% ROR Oncotype DX counseling: I discussed the result of Oncotype DX scoring provided her with a copy of this report. Based upon her risk score of 44, with tamoxifen alone the risk of recurrence of 30%. This is very high risk and I recommended systemic chemotherapy. Adjuvant chemotherapy with dose dense Adriamycin and Cytoxan every 2 weeks 4 followed by Abraxane weekly 7 stopped early due to toxicities ( started 11/09/2015 completed 03/07/2016) Adjuvant radiation therapy completed 05/15/2016  Current treatment: Adjuvant antiestrogen therapy with tamoxifen 20 mg daily to start 06/13/2016 Tamoxifen toxicities: Denies any  hot flashes are myalgias, tolerating it extremely well.  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  The patient has a good understanding of the overall plan. she agrees with it. she will call with any problems that may develop before the next visit here.   Rulon Eisenmenger, MD 09/24/16

## 2016-09-24 NOTE — Assessment & Plan Note (Signed)
Right Lumpectomy: 09/14/2015 IDC Grade 2/3, 1.2 cm, LVI Present, 0/1 LN T1CN0 (Stage 1A) Oncotype DX 44, 30% ROR Oncotype DX counseling: I discussed the result of Oncotype DX scoring provided her with a copy of this report. Based upon her risk score of 44, with tamoxifen alone the risk of recurrence of 30%. This is very high risk and I recommended systemic chemotherapy. Adjuvant chemotherapy with dose dense Adriamycin and Cytoxan every 2 weeks 4 followed by Abraxane weekly 7 stopped early due to toxicities ( started 11/09/2015 completed 03/07/2016) Adjuvant radiation therapy completed 05/15/2016  Current treatment: Adjuvant antiestrogen therapy with tamoxifen 20 mg daily to start 06/13/2016 Tamoxifen toxicities:  Chemotherapy-induced peripheral neuropathy: Grade 2  Return to clinic in 6 months for follow-up

## 2016-11-07 ENCOUNTER — Encounter (HOSPITAL_COMMUNITY): Payer: Self-pay

## 2017-02-25 ENCOUNTER — Telehealth: Payer: Self-pay | Admitting: Physician Assistant

## 2017-02-25 ENCOUNTER — Ambulatory Visit (INDEPENDENT_AMBULATORY_CARE_PROVIDER_SITE_OTHER): Payer: BLUE CROSS/BLUE SHIELD | Admitting: Physician Assistant

## 2017-02-25 ENCOUNTER — Encounter: Payer: Self-pay | Admitting: Physician Assistant

## 2017-02-25 VITALS — BP 130/88 | HR 95 | Temp 98.1°F | Ht 61.0 in | Wt 188.0 lb

## 2017-02-25 DIAGNOSIS — R590 Localized enlarged lymph nodes: Secondary | ICD-10-CM

## 2017-02-25 DIAGNOSIS — J069 Acute upper respiratory infection, unspecified: Secondary | ICD-10-CM | POA: Diagnosis not present

## 2017-02-25 LAB — CBC WITH DIFFERENTIAL/PLATELET
Basophils Absolute: 0 10*3/uL (ref 0.0–0.1)
Basophils Relative: 0.7 % (ref 0.0–3.0)
EOS PCT: 1.9 % (ref 0.0–5.0)
Eosinophils Absolute: 0.1 10*3/uL (ref 0.0–0.7)
HCT: 43.1 % (ref 36.0–46.0)
Hemoglobin: 14 g/dL (ref 12.0–15.0)
LYMPHS ABS: 0.8 10*3/uL (ref 0.7–4.0)
Lymphocytes Relative: 16 % (ref 12.0–46.0)
MCHC: 32.5 g/dL (ref 30.0–36.0)
MCV: 78 fl (ref 78.0–100.0)
MONO ABS: 0.4 10*3/uL (ref 0.1–1.0)
Monocytes Relative: 8.7 % (ref 3.0–12.0)
NEUTROS ABS: 3.7 10*3/uL (ref 1.4–7.7)
NEUTROS PCT: 72.7 % (ref 43.0–77.0)
PLATELETS: 214 10*3/uL (ref 150.0–400.0)
RBC: 5.53 Mil/uL — ABNORMAL HIGH (ref 3.87–5.11)
RDW: 13.5 % (ref 11.5–15.5)
WBC: 5.1 10*3/uL (ref 4.0–10.5)

## 2017-02-25 MED ORDER — AMOXICILLIN-POT CLAVULANATE 875-125 MG PO TABS: 1.0000 | ORAL_TABLET | Freq: Two times a day (BID) | ORAL | 0 refills | Status: DC

## 2017-02-25 NOTE — Patient Instructions (Addendum)
It was great meeting you today!  Please go to the lab to have your blood work completed.  Please begin your antibiotic today.   If you develop any fever, shortness of breath, or difficulty opening/closing mouth, please go to the emergency room.  Please return to the office in 2 weeks for a physical. If your lymph node does not decrease in size after about 2 weeks, please let us know, you may need further work-up, such as some imaging performed.    Lymphadenopathy Lymphadenopathy refers to swollen or enlarged lymph glands, also called lymph nodes. Lymph glands are part of your body's defense (immune) system, which protects the body from infections, germs, and diseases. Lymph glands are found in many locations in your body, including the neck, underarm, and groin. Many things can cause lymph glands to become enlarged. When your immune system responds to germs, such as viruses or bacteria, infection-fighting cells and fluid build up. This causes the glands to grow in size. Usually, this is not something to worry about. The swelling and any soreness often go away without treatment. However, swollen lymph glands can also be caused by a number of diseases. Your health care provider may do various tests to help determine the cause. If the cause of your swollen lymph glands cannot be found, it is important to monitor your condition to make sure the swelling goes away. Follow these instructions at home: Watch your condition for any changes. The following actions may help to lessen any discomfort you are feeling:  Get plenty of rest.  Take medicines only as directed by your health care provider. Your health care provider may recommend over-the-counter medicines for pain.  Apply moist heat compresses to the site of swollen lymph nodes as directed by your health care provider. This can help reduce any pain.  Check your lymph nodes daily for any changes.  Keep all follow-up visits as directed by your  health care provider. This is important. Contact a health care provider if:  Your lymph nodes are still swollen after 2 weeks.  Your swelling increases or spreads to other areas.  Your lymph nodes are hard, seem fixed to the skin, or are growing rapidly.  Your skin over the lymph nodes is red and inflamed.  You have a fever.  You have chills.  You have fatigue.  You develop a sore throat.  You have abdominal pain.  You have weight loss.  You have night sweats. Get help right away if:  You notice fluid leaking from the area of the enlarged lymph node.  You have severe pain in any area of your body.  You have chest pain.  You have shortness of breath. This information is not intended to replace advice given to you by your health care provider. Make sure you discuss any questions you have with your health care provider. Document Released: 09/04/2008 Document Revised: 05/03/2016 Document Reviewed: 07/01/2014 Elsevier Interactive Patient Education  2017 [employer].

## 2017-02-25 NOTE — Progress Notes (Signed)
Pre visit review using our clinic review tool, if applicable. No additional management support is needed unless otherwise documented below in the visit note. 

## 2017-02-25 NOTE — Telephone Encounter (Signed)
Patient has questions about her imaging results. Please advise.

## 2017-02-25 NOTE — Progress Notes (Signed)
Subjective:    Patient ID: Jessica Petty, female    DOB: October 25, 1976, 41 y.o.   MRN: [id number]  HPI  Jessica Petty is a 41 y/o female who presents to clinic today to establish care.  Acute Concerns: Nodule in neck area - yesterday patient noticed a tender nodule to her L neck area, does admit to having a cold for approx the past 1 week, has had some sinus pressure, runny nose, cough bringing up mucus, no blood, good appetite, no fevers, has tried Emergen-C, Tylenol Cold and Sinus, no history of seasonal allergies, no SOB, not obstructing airway, has never had this before  Chronic Issues: Breast cancer of R breast - treated with R lumpectomy followed by adjuvant chemotherapy, followed by radiation, she is currently on tamoxifen therapy, she is tolerating this well, oncologist is Dr. Loleta Dicker ADHD, anxiety and bipolar disorder - managed by Dr. Toy Care, whom she sees q 3-6 months; currently taking Abilify, Xanax and Adderall (bipolar)  Health Maintenance: Immunizations --  Up to date Colonoscopy -- none, start at age 54 Mammogram -- records requested PAP -- PAPs performed q 6 months Diet -- eats healthy, eats all food groups, limit sweets and sodas Caffeine intake -- not too much, doesn't drink coffee or soda Sleep habits -- sleeps "hard", does snore but no pausing in breathing to her knowledge Exercise -- walks every morning, under an hour ~2 miles Weight --   188 lb -- increased 20lb after treatments Mood -- fine  Other providers/specialists: Dr. Toy Care -- psych Dr. Lindi Adie - oncology Dr. Edwyna Ready [facility] Eye doctor - Dr. Cena Benton Dentist - was Dr. Yong Channel, will be seeing someone at that practice, as he has retired  Review of Systems  See HPI  Past Medical History:  Diagnosis Date  . ADHD (attention deficit hyperactivity disorder)   . Anxiety   . Breast cancer ([place])   . Radiation 04/05/16-05/15/16   right breast 50.4 Gy     Social History   Social History  . Marital status:  Single    Spouse name: N/A  . Number of children: N/A  . Years of education: N/A   Occupational History  . Not on file.   Social History Main Topics  . Smoking status: Former Smoker    Types: Cigarettes  . Smokeless tobacco: Never Used     Comment: less than half a pack - only smoked socially, maybe once a week  . Alcohol use Yes     Comment: 1 drinks per day  . Drug use: No  . Sexual activity: Yes    Birth control/ protection: IUD     Comment: no period since 2011   Other Topics Concern  . Not on file   Social History Narrative  . No narrative on file    Past Surgical History:  Procedure Laterality Date  . PORTACATH PLACEMENT N/A 10/12/2015   Procedure: INSERTION PORT-A-CATH WITH ULTRASOUND;  Surgeon: Rolm Bookbinder, MD;  Location: [facility];  Service: General;  Laterality: N/A;  . RADIOACTIVE SEED GUIDED MASTECTOMY WITH AXILLARY SENTINEL LYMPH NODE BIOPSY Right 09/14/2015   Procedure: RADIOACTIVE SEED GUIDED PARTIAL MASTECTOMY WITH AXILLARY SENTINEL LYMPH NODE BIOPSY;  Surgeon: Rolm Bookbinder, MD;  Location: [facility];  Service: General;  Laterality: Right;  . RE-EXCISION OF BREAST LUMPECTOMY Right 10/12/2015   Procedure: RE-EXCISION MARGIN RIGHT BREAST;  Surgeon: Rolm Bookbinder, MD;  Location: [facility];  Service: General;  Laterality: Right;  .  WISDOM TOOTH EXTRACTION      Family History  Problem Relation Age of Onset  . Liver cancer Maternal Aunt 62    terminal; not a drinker  . Lung cancer Maternal Uncle 69    former smoker  . Ovarian cancer Paternal Aunt 1    metastasis to cervix and other areas  . Other Sister     "couple of benign cervical biopsies"  . Stroke Maternal Grandmother   . Heart Problems Maternal Grandmother   . Heart Problems Maternal Grandfather   . Heart attack Paternal Grandmother     Allergies  Allergen Reactions  . Tamiflu [Oseltamivir Phosphate] Nausea And Vomiting    Current  Outpatient Prescriptions on File Prior to Visit  Medication Sig Dispense Refill  . ALPRAZolam (XANAX) 1 MG tablet Take 1 mg by mouth 3 (three) times daily as needed (Prescribed by Dr, Toy Care).   5  . ARIPiprazole (ABILIFY) 10 MG tablet Take 10 mg by mouth at bedtime. Prescribed by Dr. Toy Care  11  . tamoxifen (NOLVADEX) 20 MG tablet Take 1 tablet (20 mg total) by mouth daily. (Patient taking differently: Take 20 mg by mouth daily. ) 90 tablet 3   Current Facility-Administered Medications on File Prior to Visit  Medication Dose Route Frequency Provider Last Rate Last Dose  . sodium chloride 0.9 % injection 10 mL  10 mL Intracatheter PRN Nicholas Lose, MD   10 mL at 01/11/16 1103    BP 130/88 (BP Location: Left Arm, Patient Position: Sitting, Cuff Size: Large)   Pulse 95   Temp 98.1 F (36.7 C) (Oral)   Ht 5\' 1"  (1.549 m)   Wt 188 lb (85.3 kg)   SpO2 98%   BMI 35.52 kg/m       Objective:   Physical Exam  Constitutional: She appears well-developed and well-nourished. She is cooperative.  Non-toxic appearance. She does not have a sickly appearance. She does not appear ill. No distress.  HENT:  Head: Normocephalic and atraumatic.  Right Ear: Tympanic membrane, external ear and ear canal normal. Tympanic membrane is not erythematous, not retracted and not bulging.  Left Ear: Tympanic membrane, external ear and ear canal normal. Tympanic membrane is not erythematous, not retracted and not bulging.  Nose: Nose normal. Right sinus exhibits no maxillary sinus tenderness and no frontal sinus tenderness. Left sinus exhibits no maxillary sinus tenderness and no frontal sinus tenderness.  Mouth/Throat: Uvula is midline and mucous membranes are normal. No oropharyngeal exudate, posterior oropharyngeal edema or posterior oropharyngeal erythema.  Neck: Normal range of motion.  Cardiovascular: Normal rate, regular rhythm and normal heart sounds.   Pulmonary/Chest: Effort normal and breath sounds normal. No  accessory muscle usage. No respiratory distress.  Lymphadenopathy:    She has cervical adenopathy.       Left cervical: Superficial cervical (tender to palpation) adenopathy present.  Neurological: She is alert.  Skin: Skin is warm, dry and intact.  Nursing note and vitals reviewed.     Assessment & Plan:  Left cervical lymphadenopathy and URI Dr. Briscoe Deutscher in to see patient. Suspect lymph node is secondary to recent URI, however given medical hx significant for cancer, will order CBC and start Augmentin. I would like for patient to follow-up with [place] in 2 weeks for physical and follow-up of lymph node . If lymph node is not improved, I will refer her for further imaging. I advised patient as follows: If you develop any fever, shortness of breath, or difficulty opening/closing mouth,  please go to the emergency room. - CBC with Differential/Platelet  Inda Coke PA-C 02/25/17

## 2017-02-26 ENCOUNTER — Other Ambulatory Visit: Payer: Self-pay | Admitting: Physician Assistant

## 2017-02-26 ENCOUNTER — Telehealth: Payer: Self-pay | Admitting: Physician Assistant

## 2017-02-26 DIAGNOSIS — R590 Localized enlarged lymph nodes: Secondary | ICD-10-CM

## 2017-02-26 NOTE — Progress Notes (Signed)
I spoke with patient on the phone and we discussed the concerns that she has about the lymph node increasing in size. I also discussed the case with Dr. Briscoe Deutscher. Patient is concerned given her recent history of breast cancer. She is currently on her third dose of the antibiotic. We discussed referral to ENT vs ultrasound of her neck. She is agreeable to ultrasound of her neck for further evaluation of her lymph node. I told patient that I would make this a stat order and that someone would be contacting her soon about scheduling this. She denies fever, shortness of breath, or worsening tenderness. Patient was agreeable to plan. Denies any further questions.

## 2017-02-26 NOTE — Telephone Encounter (Signed)
Spoke to pt, see result note.

## 2017-02-26 NOTE — Telephone Encounter (Signed)
I spoke with patient on the telephone. She has concerns that the lymph node is increasing in size. I discussed case with Dr. Briscoe Deutscher. Patient is concerned because of history of breast cancer. She is on day 3 of her antibiotic. We discussed 2 different options, either stat ultrasound of the area versus stat ENT referral. Patient is agreeable to ultrasound. I have put this order in. She denies fever, worsening tenderness, difficulty breathing, inability to open and close mouth. I discussed with her that she should be contacted soon regarding scheduling this ultrasound. Follow-up with [place] if anything concerning arises in the meantime. Patient denies further questions is agreeable to plan.  Inda Coke PA-C

## 2017-02-27 ENCOUNTER — Other Ambulatory Visit: Payer: Self-pay | Admitting: Physician Assistant

## 2017-02-27 ENCOUNTER — Ambulatory Visit
Admission: RE | Admit: 2017-02-27 | Discharge: 2017-02-27 | Disposition: A | Discharge status: Home / Self Care | Payer: BLUE CROSS/BLUE SHIELD | Source: Ambulatory Visit | Attending: Physician Assistant | Admitting: Physician Assistant

## 2017-02-27 DIAGNOSIS — R590 Localized enlarged lymph nodes: Secondary | ICD-10-CM

## 2017-03-04 ENCOUNTER — Encounter: Payer: Self-pay | Admitting: Physician Assistant

## 2017-03-05 ENCOUNTER — Encounter: Payer: Self-pay | Admitting: Physician Assistant

## 2017-03-05 DIAGNOSIS — Z975 Presence of (intrauterine) contraceptive device: Secondary | ICD-10-CM | POA: Insufficient documentation

## 2017-03-05 DIAGNOSIS — R87612 Low grade squamous intraepithelial lesion on cytologic smear of cervix (LGSIL): Secondary | ICD-10-CM | POA: Insufficient documentation

## 2017-03-11 ENCOUNTER — Encounter: Payer: BLUE CROSS/BLUE SHIELD | Admitting: Physician Assistant

## 2017-03-15 ENCOUNTER — Encounter: Payer: Self-pay | Admitting: Physician Assistant

## 2017-03-15 ENCOUNTER — Ambulatory Visit (INDEPENDENT_AMBULATORY_CARE_PROVIDER_SITE_OTHER): Payer: BLUE CROSS/BLUE SHIELD | Admitting: Physician Assistant

## 2017-03-15 VITALS — BP 130/76 | HR 90 | Temp 98.7°F | Ht 61.0 in | Wt 191.4 lb

## 2017-03-15 DIAGNOSIS — R590 Localized enlarged lymph nodes: Secondary | ICD-10-CM

## 2017-03-15 DIAGNOSIS — Z170 Estrogen receptor positive status [ER+]: Secondary | ICD-10-CM

## 2017-03-15 DIAGNOSIS — C50211 Malignant neoplasm of upper-inner quadrant of right female breast: Secondary | ICD-10-CM | POA: Diagnosis not present

## 2017-03-15 DIAGNOSIS — Z6836 Body mass index (BMI) 36.0-36.9, adult: Secondary | ICD-10-CM

## 2017-03-15 DIAGNOSIS — F419 Anxiety disorder, unspecified: Secondary | ICD-10-CM | POA: Diagnosis not present

## 2017-03-15 DIAGNOSIS — Z0000 Encounter for general adult medical examination without abnormal findings: Secondary | ICD-10-CM | POA: Diagnosis not present

## 2017-03-15 DIAGNOSIS — F909 Attention-deficit hyperactivity disorder, unspecified type: Secondary | ICD-10-CM

## 2017-03-15 DIAGNOSIS — E6609 Other obesity due to excess calories: Secondary | ICD-10-CM | POA: Diagnosis not present

## 2017-03-15 NOTE — Assessment & Plan Note (Signed)
Well controlled. Takes 10mg  Abilify daily and Xanax prn. Followed by psych.

## 2017-03-15 NOTE — Progress Notes (Signed)
Pre visit review using our clinic review tool, if applicable. No additional management support is needed unless otherwise documented below in the visit note. 

## 2017-03-15 NOTE — Assessment & Plan Note (Signed)
Well controlled. Takes 20 mg Adderall QID, followed by psych.

## 2017-03-15 NOTE — Progress Notes (Signed)
I acted as a Education administrator for [employer], PA-C [employer], LPN  Subjective:    Jessica Petty is a 41 y.o. female and is here for a comprehensive physical exam.  HPI  Health Maintenance Due  Topic Date Due  . HIV Screening  03/24/1992  . PAP SMEAR  03/12/2017    Acute Concerns: L cervical LAD -- was seen by ENT after u/s completed and was told to follow-up if lymph node persists; was told that her lymph node was likely not related to her hx of breast cancer; she completed augmentin rx;  lymph node has essentially resolved  Chronic Issues: ADHD -- takes 20 mg Adderall QID, prescribed by psychiatrist Dr. Toy Care, feels as though this is well-controlled Anxiety -- takes 10 mg Abilify daily and Xanax prn, prescribed by Dr. Toy Care, well controlled Breast cancer -- currently on tamoxifen therapy, sees oncologist q 6 months  Health Maintenance: Immunizations --  Up to date Colonoscopy -- none, start at age 89 Mammogram -- records requested PAP -- PAPs performed q 6 months Diet -- eats healthy, eats all food groups, limit sweets and sodas Caffeine intake -- not too much, doesn't drink coffee or soda Sleep habits -- sleeps "hard", does snore but no pausing in breathing to her knowledge Exercise -- walks every morning, under an hour ~2 miles, now a member of a gym, has a buddy that she does with to hold her accountable Weight --    Wt Readings from Last 1 Encounters:  03/15/17 191 lb 6.1 oz (86.8 kg)  increased to 20lb after treatments, trying to lose weight Mood -- fine  Depression screen [facility] 2/9 03/15/2017  Decreased Interest 0  Down, Depressed, Hopeless 0  PHQ - 2 Score 0  Altered sleeping -  Tired, decreased energy -  Change in appetite -  Feeling bad or failure about yourself  -  Trouble concentrating -  Moving slowly or fidgety/restless -  Suicidal thoughts -  PHQ-9 Score -   Other providers/specialists: Dr. Toy Care -- psych Dr. Lindi Adie - oncology Dr. Edwyna Ready Erling Conte  Ob-Gyn Eye doctor - Dr. Cena Benton Dentist - was Dr. Yong Channel, will be seeing someone at that practice, as he has retired  PMHx, SurgHx, SocialHx, Medications, and Allergies were reviewed in the Visit Navigator and updated as appropriate.   Past Medical History:  Diagnosis Date  . ADHD (attention deficit hyperactivity disorder)   . Anxiety   . Bipolar disorder ([place])   . Breast cancer ([place])   . Radiation 04/05/16-05/15/16   right breast 50.4 Gy     Past Surgical History:  Procedure Laterality Date  . PORTACATH PLACEMENT N/A 10/12/2015   Procedure: INSERTION PORT-A-CATH WITH ULTRASOUND;  Surgeon: Rolm Bookbinder, MD;  Location: [facility];  Service: General;  Laterality: N/A;  . RADIOACTIVE SEED GUIDED MASTECTOMY WITH AXILLARY SENTINEL LYMPH NODE BIOPSY Right 09/14/2015   Procedure: RADIOACTIVE SEED GUIDED PARTIAL MASTECTOMY WITH AXILLARY SENTINEL LYMPH NODE BIOPSY;  Surgeon: Rolm Bookbinder, MD;  Location: [facility];  Service: General;  Laterality: Right;  . RE-EXCISION OF BREAST LUMPECTOMY Right 10/12/2015   Procedure: RE-EXCISION MARGIN RIGHT BREAST;  Surgeon: Rolm Bookbinder, MD;  Location: [facility];  Service: General;  Laterality: Right;  . WISDOM TOOTH EXTRACTION       Family History  Problem Relation Age of Onset  . Liver cancer Maternal Aunt 62    terminal; not a drinker  . Lung cancer Maternal Uncle 70  former smoker  . Ovarian cancer Paternal Aunt 70    metastasis to cervix and other areas  . Other Sister     "couple of benign cervical biopsies"  . Stroke Maternal Grandmother   . Heart Problems Maternal Grandmother   . Heart Problems Maternal Grandfather   . Heart attack Paternal Grandmother     Social History  Substance Use Topics  . Smoking status: Former Smoker    Types: Cigarettes  . Smokeless tobacco: Never Used     Comment: less than half a pack - only smoked socially, maybe once a week  . Alcohol use Yes      Comment: 1 drinks per day    Review of Systems:   Review of Systems  Constitutional: Negative.        Increased Appetite  HENT: Negative.   Eyes: Negative.   Respiratory: Negative.   Cardiovascular: Negative.   Gastrointestinal: Negative.   Genitourinary: Negative.   Musculoskeletal: Negative.   Skin: Negative.   Neurological: Negative.   Endo/Heme/Allergies: Negative.   Psychiatric/Behavioral: Negative.     Objective:   BP 130/76 (BP Location: Left Arm, Patient Position: Sitting, Cuff Size: Large)   Pulse 90   Temp 98.7 F (37.1 C) (Oral)   Ht 5\' 1"  (1.549 m)   Wt 191 lb 6.1 oz (86.8 kg)   SpO2 97%   BMI 36.16 kg/m   General Appearance:    Alert, cooperative, no distress, appears stated age  Head:    Normocephalic, without obvious abnormality, atraumatic  Eyes:    PERRL, conjunctiva/corneas clear, EOM's intact, fundi    benign, both eyes  Ears:    Normal TM's and external ear canals, both ears  Nose:   Nares normal, septum midline, mucosa normal, no drainage    or sinus tenderness  Throat:   Lips, mucosa, and tongue normal; teeth and gums normal  Neck:   Supple, symmetrical, trachea midline, no adenopathy;    thyroid:  no enlargement/tenderness/nodules; no carotid   bruit or JVD  Back:     Symmetric, no curvature, ROM normal, no CVA tenderness  Lungs:     Clear to auscultation bilaterally, respirations unlabored  Chest Wall:    No tenderness or deformity   Heart:    Regular rate and rhythm, S1 and S2 normal, no murmur, rub   or gallop  Breast Exam:    Deferred  Abdomen:     Soft, non-tender, bowel sounds active all four quadrants,    no masses, no organomegaly  Genitalia:    Deferred  Rectal:    Normal tone, normal prostate, no masses or tenderness;   guaiac negative stool  Extremities:   Extremities normal, atraumatic, no cyanosis or edema  Pulses:   2+ and symmetric all extremities  Skin:   Skin color, texture, turgor normal, no rashes or lesions  Lymph  nodes:   Cervical, supraclavicular, and axillary nodes normal  Neurologic:   CNII-XII intact, normal strength, sensation and reflexes    throughout    Assessment/Plan:   Problem List Items Addressed This Visit      Other   Breast cancer of upper-inner quadrant of right female breast ([place]) Currently on Tamoxifen therapy. Followed q 6 months by oncology.     Anxiety    Well controlled. Takes 10mg  Abilify daily and Xanax prn. Followed by psych.      Attention deficit hyperactivity disorder (ADHD)    Well controlled. Takes 20 mg Adderall QID, followed by psych.  Other Visit Diagnoses    Routine general medical examination at a health care facility    -  Primary Today patient counseled on age appropriate routine health concerns for screening and prevention, each reviewed and up to date or declined. Immunizations reviewed and up to date or declined. Labs ordered and reviewed. Risk factors for depression reviewed and negative. Hearing function and visual acuity are intact. ADLs screened and addressed as needed. Functional ability and level of safety reviewed and appropriate. Education, counseling and referrals performed based on assessed risks today. Patient provided with a copy of personalized plan for preventive services.    Left cervical lymphadenopathy     Essentially resolved. Follow-up with ENT prn.   Class 2 obesity We discussed healthy diet strategies. I recommended use of MyFitness Pal. Patient is very motivated. Continue trying to go to the gym regularly.     Well Adult Exam: Labs ordered: Yes. Patient counseling was done. See below for items discussed. Discussed the patient's BMI. The BMI BMI is not in the acceptable range; BMI management plan is completed Follow up as needed for acute illness.  Patient Counseling:   [x]     Nutrition: Stressed importance of moderation in sodium/caffeine intake, saturated fat and cholesterol, caloric balance, sufficient intake of fresh  fruits, vegetables, fiber, calcium, iron, and 1 mg of folate supplement per day (for females capable of pregnancy).   [x]      Stressed the importance of regular exercise.    [x]     Substance Abuse: Discussed cessation/primary prevention of tobacco, alcohol, or other drug use; driving or other dangerous activities under the influence; availability of treatment for abuse.    [x]      Injury prevention: Discussed safety belts, safety helmets, smoke detector, smoking near bedding or upholstery.    []      Sexuality: Discussed sexually transmitted diseases, partner selection, use of condoms, avoidance of unintended pregnancy  and contraceptive alternatives.    []     Dental health: Discussed importance of regular tooth brushing, flossing, and dental visits.   [x]      Health maintenance and immunizations reviewed. Please refer to Health maintenance section.   CMA or LPN served as scribe during this visit. History, Physical, and Plan performed by medical provider. Documentation and orders reviewed and attested to.  Inda Coke, PA-C [place]

## 2017-03-15 NOTE — Patient Instructions (Signed)
It was great seeing you today!  Continue to work on [employer]. Consider using the MyFitnessPal app to help track calories.  Health Maintenance, Female Adopting a healthy lifestyle and getting preventive care can go a long way to promote health and wellness. Talk with your health care provider about what schedule of regular examinations is right for you. This is a good chance for you to check in with your provider about disease prevention and staying healthy. In between checkups, there are plenty of things you can do on your own. Experts have done a lot of research about which lifestyle changes and preventive measures are most likely to keep you healthy. Ask your health care provider for more information. Weight and diet Eat a healthy diet  Be sure to include plenty of vegetables, fruits, low-fat dairy products, and lean protein.  Do not eat a lot of foods high in solid fats, added sugars, or salt.  Get regular exercise. This is one of the most important things you can do for your health.  Most adults should exercise for at least 150 minutes each week. The exercise should increase your heart rate and make you sweat (moderate-intensity exercise).  Most adults should also do strengthening exercises at least twice a week. This is in addition to the moderate-intensity exercise. Maintain a healthy weight  Body mass index (BMI) is a measurement that can be used to identify possible weight problems. It estimates body fat based on height and weight. Your health care provider can help determine your BMI and help you achieve or maintain a healthy weight.  For females 84 years of age and older:  A BMI below 18.5 is considered underweight.  A BMI of 18.5 to 24.9 is normal.  A BMI of 25 to 29.9 is considered overweight.  A BMI of 30 and above is considered obese. Watch levels of cholesterol and blood lipids  You should start having your blood tested for lipids and cholesterol at 40 years of  age, then have this test every 5 years.  You may need to have your cholesterol levels checked more often if:  Your lipid or cholesterol levels are high.  You are older than 40 years of age.  You are at high risk for heart disease. Cancer screening Lung Cancer  Lung cancer screening is recommended for adults 43-52 years old who are at high risk for lung cancer because of a history of smoking.  A yearly low-dose CT scan of the lungs is recommended for people who:  Currently smoke.  Have quit within the past 15 years.  Have at least a 30-pack-year history of smoking. A pack year is smoking an average of one pack of cigarettes a day for 1 year.  Yearly screening should continue until it has been 15 years since you quit.  Yearly screening should stop if you develop a health problem that would prevent you from having lung cancer treatment. Breast Cancer  Practice breast self-awareness. This means understanding how your breasts normally appear and feel.  It also means doing regular breast self-exams. Let your health care provider know about any changes, no matter how small.  If you are in your 20s or 30s, you should have a clinical breast exam (CBE) by a health care provider every 1-3 years as part of a regular health exam.  If you are 39 or older, have a CBE every year. Also consider having a breast X-ray (mammogram) every year.  If you have a family history  of breast cancer, talk to your health care provider about genetic screening.  If you are at high risk for breast cancer, talk to your health care provider about having an MRI and a mammogram every year.  Breast cancer gene (BRCA) assessment is recommended for women who have family members with BRCA-related cancers. BRCA-related cancers include:  Breast.  Ovarian.  Tubal.  Peritoneal cancers.  Results of the assessment will determine the need for genetic counseling and BRCA1 and BRCA2 testing. Cervical Cancer  Your  health care provider may recommend that you be screened regularly for cancer of the pelvic organs (ovaries, uterus, and vagina). This screening involves a pelvic examination, including checking for microscopic changes to the surface of your cervix (Pap test). You may be encouraged to have this screening done every 3 years, beginning at age 23.  For women ages 40-65, health care providers may recommend pelvic exams and Pap testing every 3 years, or they may recommend the Pap and pelvic exam, combined with testing for human papilloma virus (HPV), every 5 years. Some types of HPV increase your risk of cervical cancer. Testing for HPV may also be done on women of any age with unclear Pap test results.  Other health care providers may not recommend any screening for nonpregnant women who are considered low risk for pelvic cancer and who do not have symptoms. Ask your health care provider if a screening pelvic exam is right for you.  If you have had past treatment for cervical cancer or a condition that could lead to cancer, you need Pap tests and screening for cancer for at least 20 years after your treatment. If Pap tests have been discontinued, your risk factors (such as having a new sexual partner) need to be reassessed to determine if screening should resume. Some women have medical problems that increase the chance of getting cervical cancer. In these cases, your health care provider may recommend more frequent screening and Pap tests. Colorectal Cancer  This type of cancer can be detected and often prevented.  Routine colorectal cancer screening usually begins at 40 years of age and continues through 40 years of age.  Your health care provider may recommend screening at an earlier age if you have risk factors for colon cancer.  Your health care provider may also recommend using home test kits to check for hidden blood in the stool.  A small camera at the end of a tube can be used to examine your  colon directly (sigmoidoscopy or colonoscopy). This is done to check for the earliest forms of colorectal cancer.  Routine screening usually begins at age 78.  Direct examination of the colon should be repeated every 5-10 years through 40 years of age. However, you may need to be screened more often if early forms of precancerous polyps or small growths are found. Skin Cancer  Check your skin from head to toe regularly.  Tell your health care provider about any new moles or changes in moles, especially if there is a change in a mole's shape or color.  Also tell your health care provider if you have a mole that is larger than the size of a pencil eraser.  Always use sunscreen. Apply sunscreen liberally and repeatedly throughout the day.  Protect yourself by wearing long sleeves, pants, a wide-brimmed hat, and sunglasses whenever you are outside. Heart disease, diabetes, and high blood pressure  High blood pressure causes heart disease and increases the risk of stroke. High blood pressure is  more likely to develop in:  People who have blood pressure in the high end of the normal range (130-139/85-89 mm Hg).  People who are overweight or obese.  People who are African American.  If you are 51-65 years of age, have your blood pressure checked every 3-5 years. If you are 66 years of age or older, have your blood pressure checked every year. You should have your blood pressure measured twice-once when you are at a hospital or clinic, and once when you are not at a hospital or clinic. Record the average of the two measurements. To check your blood pressure when you are not at a hospital or clinic, you can use:  An automated blood pressure machine at a pharmacy.  A home blood pressure monitor.  If you are between 50 years and 28 years old, ask your health care provider if you should take aspirin to prevent strokes.  Have regular diabetes screenings. This involves taking a blood sample to  check your fasting blood sugar level.  If you are at a normal weight and have a low risk for diabetes, have this test once every three years after 40 years of age.  If you are overweight and have a high risk for diabetes, consider being tested at a younger age or more often. Preventing infection Hepatitis B  If you have a higher risk for hepatitis B, you should be screened for this virus. You are considered at high risk for hepatitis B if:  You were born in a country where hepatitis B is common. Ask your health care provider which countries are considered high risk.  Your parents were born in a high-risk country, and you have not been immunized against hepatitis B (hepatitis B vaccine).  You have HIV or AIDS.  You use needles to inject street drugs.  You live with someone who has hepatitis B.  You have had sex with someone who has hepatitis B.  You get hemodialysis treatment.  You take certain medicines for conditions, including cancer, organ transplantation, and autoimmune conditions. Hepatitis C  Blood testing is recommended for:  Everyone born from [birth year] through 1964.  Anyone with known risk factors for hepatitis C. Sexually transmitted infections (STIs)  You should be screened for sexually transmitted infections (STIs) including gonorrhea and chlamydia if:  You are sexually active and are younger than 40 years of age.  You are older than 40 years of age and your health care provider tells you that you are at risk for this type of infection.  Your sexual activity has changed since you were last screened and you are at an increased risk for chlamydia or gonorrhea. Ask your health care provider if you are at risk.  If you do not have HIV, but are at risk, it may be recommended that you take a prescription medicine daily to prevent HIV infection. This is called pre-exposure prophylaxis (PrEP). You are considered at risk if:  You are sexually active and do not regularly use  condoms or know the HIV status of your partner(s).  You take drugs by injection.  You are sexually active with a partner who has HIV. Talk with your health care provider about whether you are at high risk of being infected with HIV. If you choose to begin PrEP, you should first be tested for HIV. You should then be tested every 3 months for as long as you are taking PrEP. Pregnancy  If you are premenopausal and you may become pregnant,  ask your health care provider about preconception counseling.  If you may become pregnant, take 400 to 800 micrograms (mcg) of folic acid every day.  If you want to prevent pregnancy, talk to your health care provider about birth control (contraception). Osteoporosis and menopause  Osteoporosis is a disease in which the bones lose minerals and strength with aging. This can result in serious bone fractures. Your risk for osteoporosis can be identified using a bone density scan.  If you are 53 years of age or older, or if you are at risk for osteoporosis and fractures, ask your health care provider if you should be screened.  Ask your health care provider whether you should take a calcium or vitamin D supplement to lower your risk for osteoporosis.  Menopause may have certain physical symptoms and risks.  Hormone replacement therapy may reduce some of these symptoms and risks. Talk to your health care provider about whether hormone replacement therapy is right for you. Follow these instructions at home:  Schedule regular health, dental, and eye exams.  Stay current with your immunizations.  Do not use any tobacco products including cigarettes, chewing tobacco, or electronic cigarettes.  If you are pregnant, do not drink alcohol.  If you are breastfeeding, limit how much and how often you drink alcohol.  Limit alcohol intake to no more than 1 drink per day for nonpregnant women. One drink equals 12 ounces of beer, 5 ounces of wine, or 1 ounces of  hard liquor.  Do not use street drugs.  Do not share needles.  Ask your health care provider for help if you need support or information about quitting drugs.  Tell your health care provider if you often feel depressed.  Tell your health care provider if you have ever been abused or do not feel safe at home. This information is not intended to replace advice given to you by your health care provider. Make sure you discuss any questions you have with your health care provider. Document Released: 06/11/2011 Document Revised: 05/03/2016 Document Reviewed: 08/30/2015 Elsevier Interactive Patient Education  2017 [employer].

## 2017-03-15 NOTE — Assessment & Plan Note (Signed)
We discussed healthy diet strategies. I recommended use of MyFitness Pal. Patient is very motivated. Continue trying to go to the gym regularly.

## 2017-03-25 ENCOUNTER — Ambulatory Visit (HOSPITAL_BASED_OUTPATIENT_CLINIC_OR_DEPARTMENT_OTHER): Payer: BLUE CROSS/BLUE SHIELD | Admitting: Hematology and Oncology

## 2017-03-25 ENCOUNTER — Encounter: Payer: Self-pay | Admitting: Hematology and Oncology

## 2017-03-25 DIAGNOSIS — Z170 Estrogen receptor positive status [ER+]: Secondary | ICD-10-CM

## 2017-03-25 DIAGNOSIS — C50211 Malignant neoplasm of upper-inner quadrant of right female breast: Secondary | ICD-10-CM

## 2017-03-25 MED ORDER — TAMOXIFEN CITRATE 20 MG PO TABS: 20.0000 mg | ORAL_TABLET | Freq: Every day | ORAL | 3 refills | Status: DC

## 2017-03-25 NOTE — Assessment & Plan Note (Signed)
Right Lumpectomy: 09/14/2015 [facility] Grade 2/3, 1.2 cm, LVI Present, 0/1 LN T1CN0 (Stage 1A) Oncotype DX 44, 30% ROR Oncotype DX counseling: I discussed the result of Oncotype DX scoring provided her with a copy of this report. Based upon her risk score of 44, with tamoxifen alone the risk of recurrence of 30%. This is very high risk and I recommended systemic chemotherapy. Adjuvant chemotherapy with dose dense Adriamycin and Cytoxan every 2 weeks 4 followed by Abraxane weekly 7 stopped early due to toxicities ( started 11/09/2015 completed 03/07/2016) Adjuvant radiation therapy completed 05/15/2016  Current treatment: Adjuvant antiestrogen therapy with tamoxifen 20 mg daily started 06/13/2016 Tamoxifen toxicities: Denies any hot flashes are myalgias, tolerating it extremely well. Breast Cancer Surveillance: 1. Breast exam 03/25/2017: Normal 2. 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

## 2017-03-25 NOTE — Progress Notes (Signed)
Patient Care Team: Inda Coke, [place] as PCP - General (Physician Assistant) Brien Few, MD as Consulting Physician (Obstetrics and Gynecology) Rolm Bookbinder, MD as Consulting Physician (General Surgery) Nicholas Lose, MD as Consulting Physician (Hematology and Oncology) Arloa Koh, MD as Consulting Physician (Radiation Oncology) Rockwell Germany, RN as Registered Nurse Mauro Kaufmann, RN as Registered Nurse Sylvan Cheese, NP as Nurse Practitioner (Nurse Practitioner)  DIAGNOSIS:  Encounter Diagnosis  Name Primary?  . Malignant neoplasm of upper-inner quadrant of right breast in female, estrogen receptor positive ([place])     SUMMARY OF ONCOLOGIC HISTORY:   Breast cancer of upper-inner quadrant of right female breast ([place])   08/10/2015 Mammogram    Right breast mass 1.2 cm irregular spiculated      08/11/2015 Initial Diagnosis    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, grade 2-3      08/18/2015 Procedure    Genetic testing normal (Genes tested include: ATM, BARD1, BRCA1, BRCA2, BRIP1, CDH1, CHEK2, FANCC, MLH1, MSH2, MSH6, NBN, PALB2, PMS2, PTEN, RAD51C, RAD51D, TP53, and XRCC2.  This panel also includes deletion/duplication analysis (without sequencing) for one gene, EPCAM)      09/14/2015 Surgery    Right lumpectomy Donne Hazel): IDC Grade 2/3, 1.2 cm, LVI Present, 0/1 LN T1CN0 (Stage 1A) Oncotype DX 44, 30% ROR, ER 100%, PR 100%, HER-2/neu negative ratio 1.38, Ki-67 20%      09/14/2015 Oncotype testing    RS 44; ROR 30% (high-risk)      10/12/2015 Surgery    Re-excision right breast inferior margins Donne Hazel). Path negative; margins negative.       11/09/2015 - 02/22/2016 Chemotherapy    adjuvant chemotherapy: Adriamycin and Cytoxan dose dense 4 followed by Abraxane weekly 7 (stopped early for toxicities)      04/05/2016 - 05/15/2016 Radiation Therapy    Adj XRT ([facility]). Right breast treated to 50.4 Gy in 28 fractions       06/2016 -   Anti-estrogen oral therapy    Tamoxifen 20 mg daily. Planned duration of treatment: 10 years.        CHIEF COMPLIANT: Follow-up on tamoxifen therapy  INTERVAL HISTORY: Jessica Petty is a 41 year old with above-mentioned history of right breast cancer currently on adjuvant tamoxifen. She is tolerating it extremely well. She is very occasional hot flashes when she lies in bed. Denies any muscle aches or pains. Denies any pain or discomfort in the breast. Neuropathy has completely resolved.  REVIEW OF SYSTEMS:   Constitutional: Denies fevers, chills or abnormal weight loss Eyes: Denies blurriness of vision Ears, nose, mouth, throat, and face: Denies mucositis or sore throat Respiratory: Denies cough, dyspnea or wheezes Cardiovascular: Denies palpitation, chest discomfort Gastrointestinal:  Denies nausea, heartburn or change in bowel habits Skin: Denies abnormal skin rashes Lymphatics: Denies new lymphadenopathy or easy bruising Neurological:Denies numbness, tingling or new weaknesses Behavioral/Psych: Mood is stable, no new changes  Extremities: No lower extremity edema Breast:  denies any pain or lumps or nodules in either breasts All other systems were reviewed with the patient and are negative.  I have reviewed the past medical history, past surgical history, social history and family history with the patient and they are unchanged from previous note.  ALLERGIES:  is allergic to tamiflu [oseltamivir phosphate].  MEDICATIONS:  Current Outpatient Prescriptions  Medication Sig Dispense Refill  . ALPRAZolam (XANAX) 1 MG tablet Take 1 mg by mouth 3 (three) times daily as needed (Prescribed by Dr, Toy Care).   5  .  amphetamine-dextroamphetamine (ADDERALL) 20 MG tablet TAKE 1 TABLET FOUR TIMES DAILY. Prescribed by Dr. Toy Care  0  . ARIPiprazole (ABILIFY) 10 MG tablet Take 10 mg by mouth at bedtime. Prescribed by Dr. Toy Care  11  . folic acid (FOLVITE) 1 MG tablet 1 (ONE) TABLET, ORAL, DAILY  3    . levonorgestrel (MIRENA) 20 MCG/24HR IUD 1 each by Intrauterine route once. Needs to be removed 12/2021    . tamoxifen (NOLVADEX) 20 MG tablet Take 1 tablet (20 mg total) by mouth daily. 90 tablet 3   No current facility-administered medications for this visit.    Facility-Administered Medications Ordered in Other Visits  Medication Dose Route Frequency Provider Last Rate Last Dose  . sodium chloride 0.9 % injection 10 mL  10 mL Intracatheter PRN Nicholas Lose, MD   10 mL at 01/11/16 1103    PHYSICAL EXAMINATION: ECOG PERFORMANCE STATUS: 0 - Asymptomatic  Vitals:   03/25/17 0823  BP: 138/79  Pulse: 84  Resp: 18  Temp: 98.2 F (36.8 C)   Filed Weights   03/25/17 0823  Weight: 189 lb 4.8 oz (85.9 kg)    GENERAL:alert, no distress and comfortable SKIN: skin color, texture, turgor are normal, no rashes or significant lesions EYES: normal, Conjunctiva are pink and non-injected, sclera clear OROPHARYNX:no exudate, no erythema and lips, buccal mucosa, and tongue normal  NECK: supple, thyroid normal size, non-tender, without nodularity LYMPH:  no palpable lymphadenopathy in the cervical, axillary or inguinal LUNGS: clear to auscultation and percussion with normal breathing effort HEART: regular rate & rhythm and no murmurs and no lower extremity edema ABDOMEN:abdomen soft, non-tender and normal bowel sounds MUSCULOSKELETAL:no cyanosis of digits and no clubbing  NEURO: alert & oriented x 3 with fluent speech, no focal motor/sensory deficits EXTREMITIES: No lower extremity edema  LABORATORY DATA:  I have reviewed the data as listed   Chemistry      Component Value Date/Time   NA 140 03/07/2016 0800   K 4.0 03/07/2016 0800   CO2 26 03/07/2016 0800   BUN 9.8 03/07/2016 0800   CREATININE 0.7 03/07/2016 0800      Component Value Date/Time   CALCIUM 9.4 03/07/2016 0800   ALKPHOS 68 03/07/2016 0800   AST 21 03/07/2016 0800   ALT 27 03/07/2016 0800   BILITOT 0.36 03/07/2016  0800       Lab Results  Component Value Date   WBC 5.1 02/25/2017   HGB 14.0 02/25/2017   HCT 43.1 02/25/2017   MCV 78.0 02/25/2017   PLT 214.0 02/25/2017   NEUTROABS 3.7 02/25/2017    ASSESSMENT & PLAN:  Breast cancer of upper-inner quadrant of right female breast (HCC) Right Lumpectomy: 09/14/2015 IDC Grade 2/3, 1.2 cm, LVI Present, 0/1 LN T1CN0 (Stage 1A) Oncotype DX 44, 30% ROR Oncotype DX counseling: I discussed the result of Oncotype DX scoring provided her with a copy of this report. Based upon her risk score of 44, with tamoxifen alone the risk of recurrence of 30%. This is very high risk and I recommended systemic chemotherapy. Adjuvant chemotherapy with dose dense Adriamycin and Cytoxan every 2 weeks 4 followed by Abraxane weekly 7 stopped early due to toxicities ( started 11/09/2015 completed 03/07/2016) Adjuvant radiation therapy completed 05/15/2016  Current treatment: Adjuvant antiestrogen therapy with tamoxifen 20 mg daily started 06/13/2016 Tamoxifen toxicities: Denies any hot flashes are myalgias, tolerating it extremely well. Breast Cancer Surveillance: 1. Breast exam 2018: done by gyn 2. 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  No orders of the defined types were placed in this encounter.  The patient has a good understanding of the overall plan. she agrees with it. she will call with any problems that may develop before the next visit here.   Rulon Eisenmenger, MD 03/25/17

## 2017-05-23 IMAGING — US US SOFT TISSUE HEAD/NECK
1 series · 6 of 6 positions shown · non-contrast
Comparison: None.

CLINICAL DATA: Concern for enlarged left cervical lymph nodes

EXAM:
ULTRASOUND OF HEAD/NECK SOFT TISSUES
TECHNIQUE: Ultrasound examination of the head and neck soft tissues was
performed in the area of clinical concern.

[Series 1: us soft tissue head/neck · 0.06mm/px · 6 of 6 slices shown]
[im 1/6]
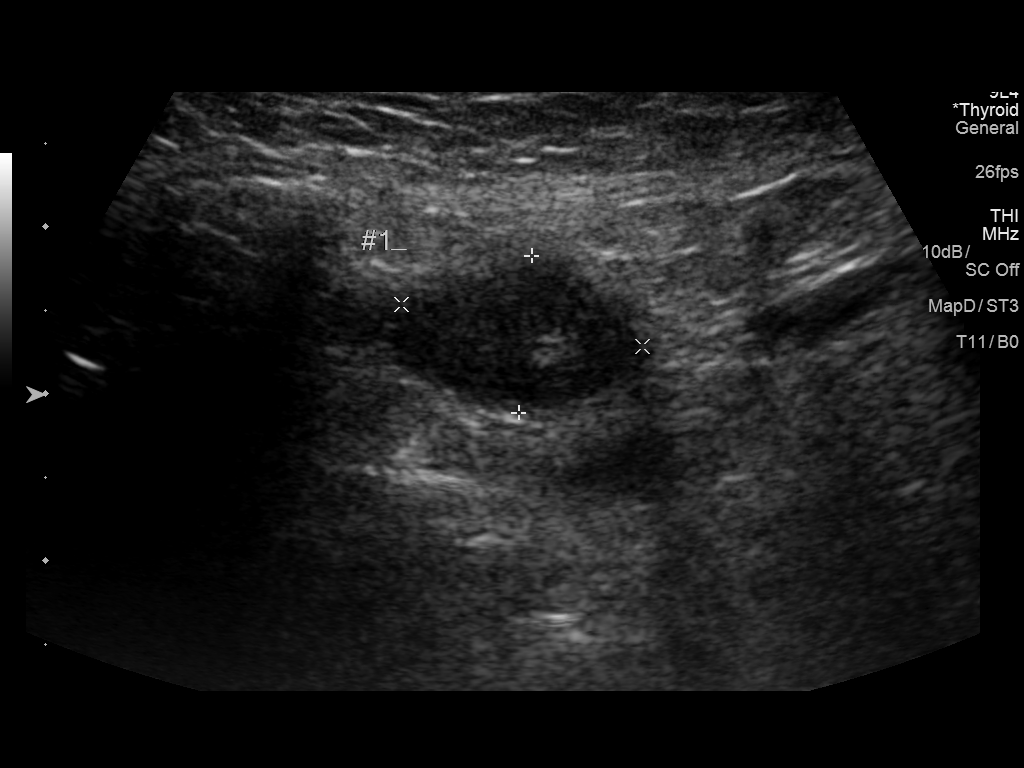
[im 2/6]
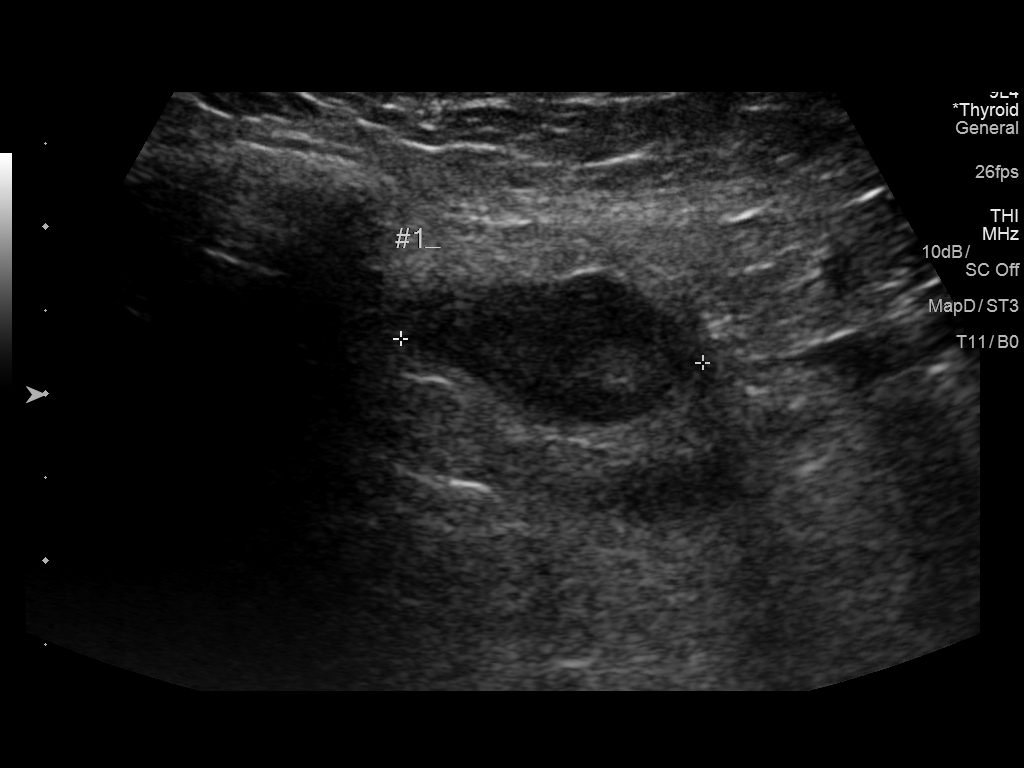
[im 3/6]
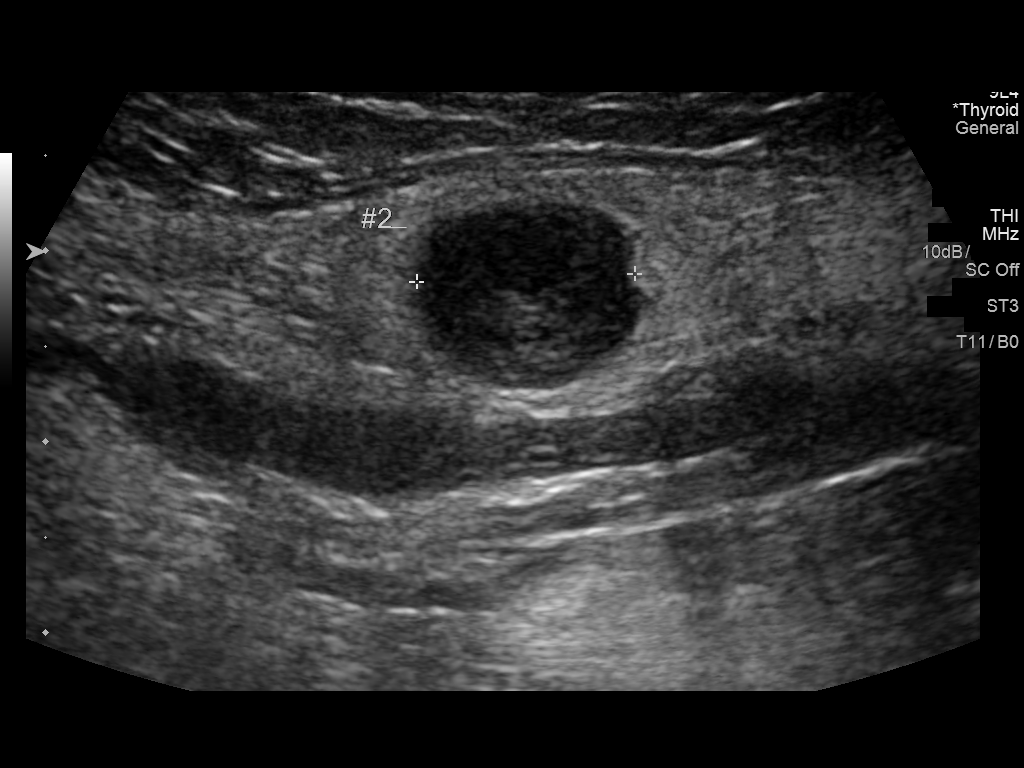
[im 4/6]
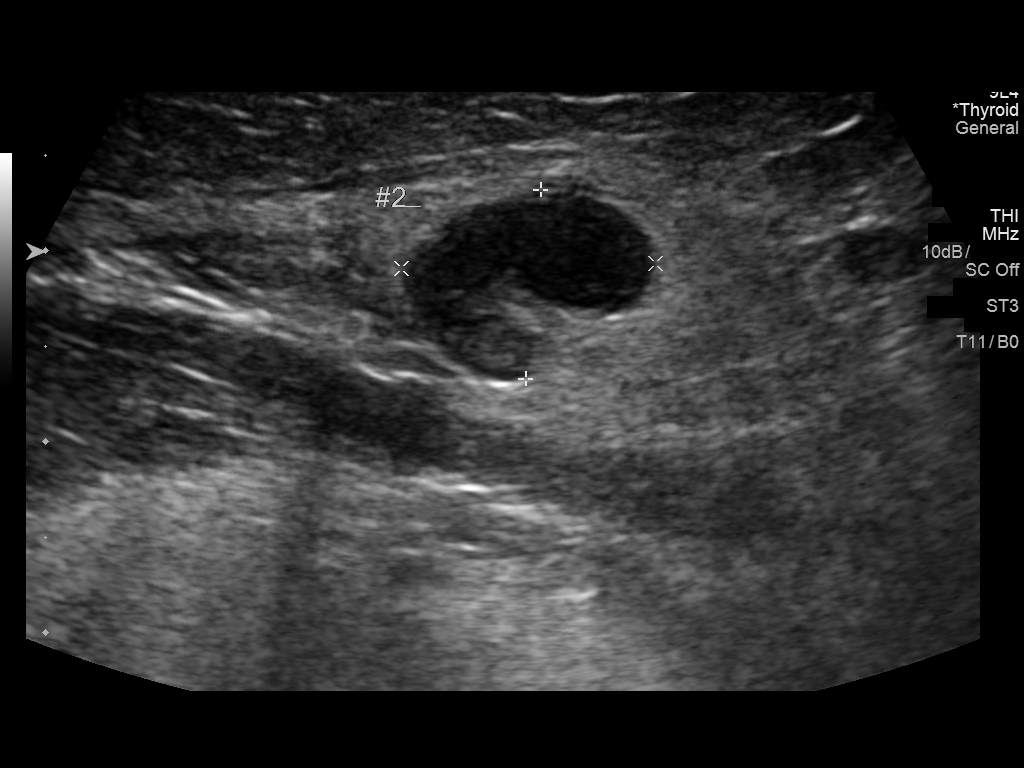
[im 5/6]
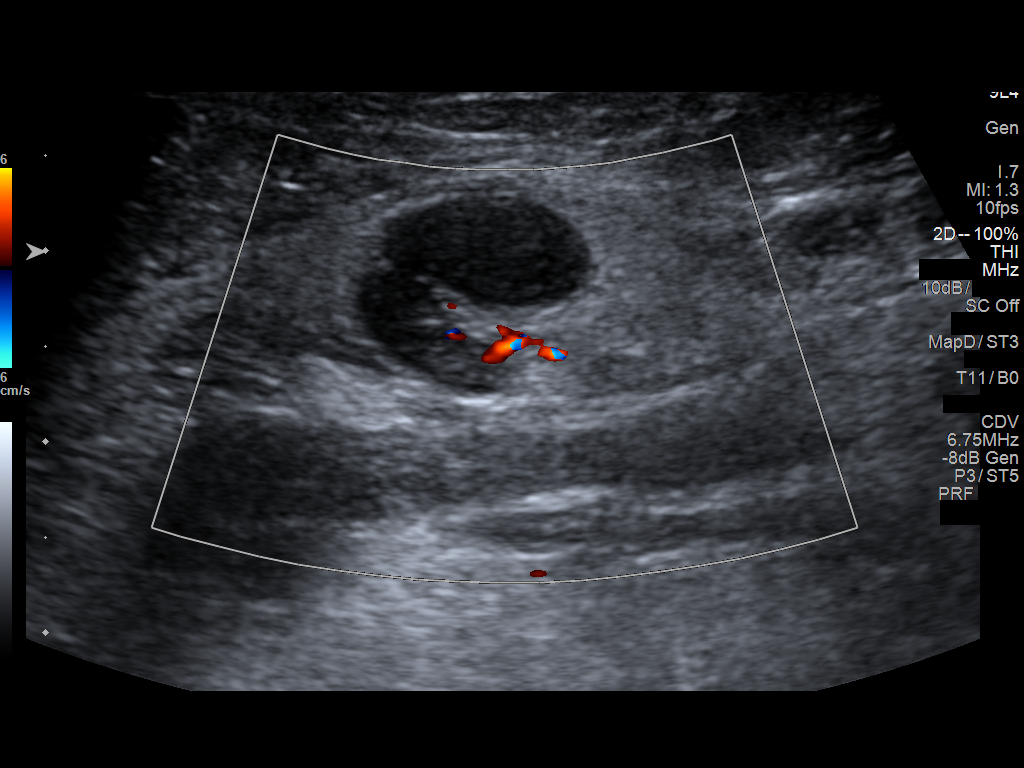
[im 6/6]
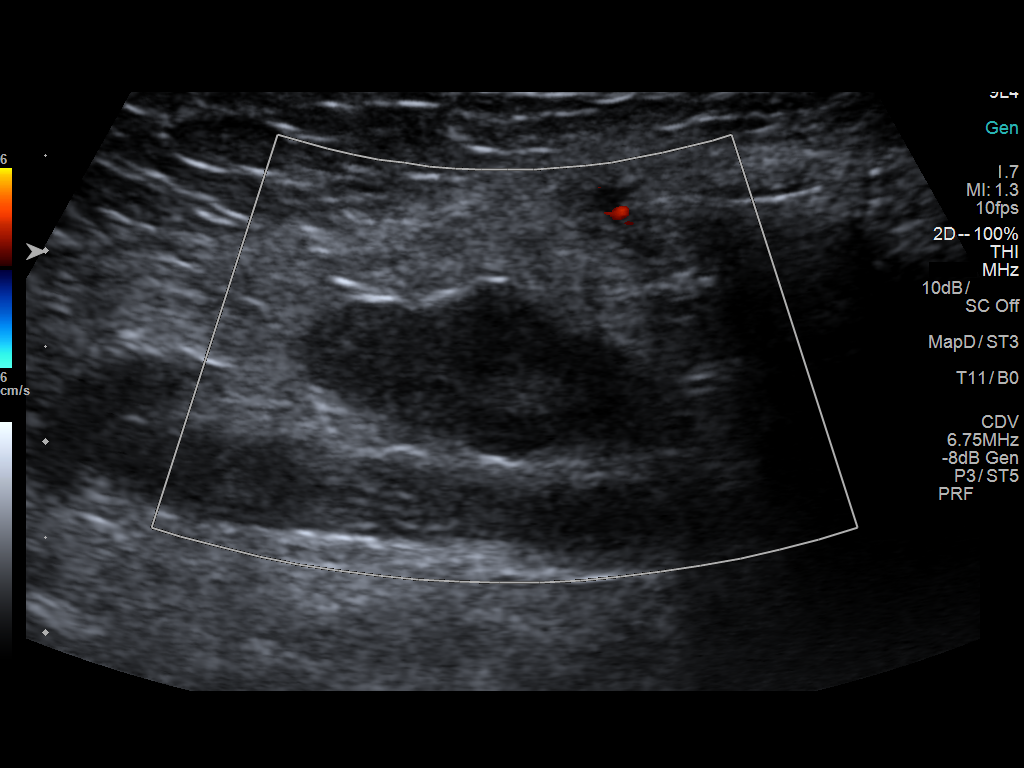

[6 of 6 positions shown; findings below may reference images not displayed]

FINDINGS: Imaging of the left side of the neck demonstrates borderline
enlarged submandibular lymph nodes. Short axis diameter of one of
the nodes is 0.9 cm and the other measures 1.0 cm.
IMPRESSION: Borderline left submandibular adenopathy as described. Malignancy is
not excluded.

## 2017-08-26 ENCOUNTER — Telehealth: Payer: Self-pay | Admitting: Physician Assistant

## 2017-08-26 DIAGNOSIS — F419 Anxiety disorder, unspecified: Secondary | ICD-10-CM

## 2017-08-26 NOTE — Telephone Encounter (Signed)
Patient needs a referral for [facility] to Dr. Oneida Arenas. Please call patient to update once this has been done.

## 2017-08-27 ENCOUNTER — Telehealth: Payer: Self-pay | Admitting: Physician Assistant

## 2017-08-27 NOTE — Telephone Encounter (Signed)
Deena from [facility] called to advise that Dr. Nicole Kindred is booked until mid-late November. Is the patient open to seeing another provider? Call Deena at [phone] extension [phone] to advise.

## 2017-08-27 NOTE — Telephone Encounter (Signed)
Left detailed message on personal voicemail that order for referral to [facility] was done and I spoke to our referral coordinator and he said he will fax referral over today. Any problems please let us know.

## 2017-08-28 NOTE — Telephone Encounter (Signed)
Called patient to advise on message from [place]. Patient states she already has appointment with them, and did not necessarily need a referral sent over, but was thankful we did.  Patient will see them as needed.

## 2017-09-05 ENCOUNTER — Encounter: Payer: Self-pay | Admitting: Physician Assistant

## 2017-09-19 LAB — LIPID PANEL
Cholesterol: 188 (ref 0–200)
HDL: 51 (ref 35–70)
LDL CALC: 83
LDL/HDL RATIO: 1.6
TRIGLYCERIDES: 265 — AB (ref 40–160)

## 2017-09-19 LAB — HEMOGLOBIN A1C: Hemoglobin A1C: 5.2

## 2017-09-19 LAB — HM HIV SCREENING LAB: HM HIV Screening: NEGATIVE

## 2017-09-19 LAB — HEPATIC FUNCTION PANEL
ALT: 31 (ref 7–35)
AST: 39 — AB (ref 13–35)
Alkaline Phosphatase: 55 (ref 25–125)
Bilirubin, Total: 0.8

## 2017-09-19 LAB — BASIC METABOLIC PANEL
BUN: 8 (ref 4–21)
Creatinine: 1 (ref 0.5–1.1)

## 2017-09-19 LAB — HIV ANTIBODY (ROUTINE TESTING W REFLEX): HIV: NONREACTIVE

## 2017-10-22 ENCOUNTER — Encounter: Payer: Self-pay | Admitting: Physician Assistant

## 2018-03-24 NOTE — Assessment & Plan Note (Signed)
Right Lumpectomy: 09/14/2015 IDC Grade 2/3, 1.2 cm, LVI Present, 0/1 LN T1CN0 (Stage 1A) Oncotype DX 44, 30% ROR Oncotype DX counseling: I discussed the result of Oncotype DX scoring provided her with a copy of this report. Based upon her risk score of 44, with tamoxifen alone the risk of recurrence of 30%. This is very high risk and I recommended systemic chemotherapy. Adjuvant chemotherapy with dose dense Adriamycin and Cytoxan every 2 weeks 4 followed by Abraxane weekly 7 stopped early due to toxicities ( started 11/09/2015 completed 03/07/2016) Adjuvant radiation therapy completed 05/15/2016  Current treatment: Adjuvant antiestrogen therapy with tamoxifen 20 mg daily started 06/13/2016   Tamoxifen toxicities:Denies any hot flashes are myalgias, tolerating it extremely well.  Breast Cancer Surveillance: 1. Breast exam 2019: Benign 2. 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

## 2018-03-24 NOTE — Progress Notes (Signed)
Patient Care Team: Inda Coke, [place] as PCP - General (Physician Assistant) Brien Few, MD as Consulting Physician (Obstetrics and Gynecology) Rolm Bookbinder, MD as Consulting Physician (General Surgery) Nicholas Lose, MD as Consulting Physician (Hematology and Oncology) Arloa Koh, MD as Consulting Physician (Radiation Oncology) Rockwell Germany, RN as Registered Nurse Mauro Kaufmann, RN as Registered Nurse Jake Shark Johny Blamer, NP as Nurse Practitioner (Nurse Practitioner)  DIAGNOSIS:  Encounter Diagnosis  Name Primary?  . Malignant neoplasm of upper-inner quadrant of right breast in female, estrogen receptor positive ([place])     SUMMARY OF ONCOLOGIC HISTORY:   Breast cancer of upper-inner quadrant of right female breast ([place])   08/10/2015 Mammogram    Right breast mass 1.2 cm irregular spiculated      08/11/2015 Initial Diagnosis    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, grade 2-3      08/18/2015 Procedure    Genetic testing normal (Genes tested include: ATM, BARD1, BRCA1, BRCA2, BRIP1, CDH1, CHEK2, FANCC, MLH1, MSH2, MSH6, NBN, PALB2, PMS2, PTEN, RAD51C, RAD51D, TP53, and XRCC2.  This panel also includes deletion/duplication analysis (without sequencing) for one gene, EPCAM)      09/14/2015 Surgery    Right lumpectomy Donne Hazel): IDC Grade 2/3, 1.2 cm, LVI Present, 0/1 LN T1CN0 (Stage 1A) Oncotype DX 44, 30% ROR, ER 100%, PR 100%, HER-2/neu negative ratio 1.38, Ki-67 20%      09/14/2015 Oncotype testing    RS 44; ROR 30% (high-risk)      10/12/2015 Surgery    Re-excision right breast inferior margins Donne Hazel). Path negative; margins negative.       11/09/2015 - 02/22/2016 Chemotherapy    adjuvant chemotherapy: Adriamycin and Cytoxan dose dense 4 followed by Abraxane weekly 7 (stopped early for toxicities)      04/05/2016 - 05/15/2016 Radiation Therapy    Adj XRT ([facility]). Right breast treated to 50.4 Gy in 28 fractions       06/2016 -  Anti-estrogen oral therapy    Tamoxifen 20 mg daily. Planned duration of treatment: 10 years.        CHIEF COMPLIANT: Follow-up on antiestrogen therapy with tamoxifen  INTERVAL HISTORY: Jessica Petty is a patient with above-mentioned history of right-sided breast cancer who underwent surgery followed by adjuvant chemotherapy radiation and is currently on tamoxifen therapy since July 2017.  She is tolerating tamoxifen fairly well with the exception of hot flashes that wake her up at night.  She denies any lumps or nodules in the breast.  REVIEW OF SYSTEMS:   Constitutional: Denies fevers, chills or abnormal weight loss Eyes: Denies blurriness of vision Ears, nose, mouth, throat, and face: Denies mucositis or sore throat Respiratory: Denies cough, dyspnea or wheezes Cardiovascular: Denies palpitation, chest discomfort Gastrointestinal:  Denies nausea, heartburn or change in bowel habits Skin: Denies abnormal skin rashes Lymphatics: Denies new lymphadenopathy or easy bruising Neurological:Denies numbness, tingling or new weaknesses Behavioral/Psych: Mood is stable, no new changes  Extremities: No lower extremity edema Breast:  denies any pain or lumps or nodules in either breasts All other systems were reviewed with the patient and are negative.  I have reviewed the past medical history, past surgical history, social history and family history with the patient and they are unchanged from previous note.  ALLERGIES:  is allergic to tamiflu [oseltamivir phosphate].  MEDICATIONS:  Current Outpatient Medications  Medication Sig Dispense Refill  . ALPRAZolam (XANAX) 1 MG tablet Take 1 mg by mouth 3 (three) times daily as needed (  Prescribed by Dr, Toy Care).   5  . amphetamine-dextroamphetamine (ADDERALL) 20 MG tablet TAKE 1 TABLET FOUR TIMES DAILY. Prescribed by Dr. Toy Care  0  . ARIPiprazole (ABILIFY) 10 MG tablet Take 10 mg by mouth at bedtime. Prescribed by Dr. Toy Care  11  . folic  acid (FOLVITE) 1 MG tablet 1 (ONE) TABLET, ORAL, DAILY  3  . levonorgestrel (MIRENA) 20 MCG/24HR IUD 1 each by Intrauterine route once. Needs to be removed 12/2021    . tamoxifen (NOLVADEX) 20 MG tablet Take 1 tablet (20 mg total) by mouth daily. 90 tablet 3   No current facility-administered medications for this visit.    Facility-Administered Medications Ordered in Other Visits  Medication Dose Route Frequency Provider Last Rate Last Dose  . sodium chloride 0.9 % injection 10 mL  10 mL Intracatheter PRN Nicholas Lose, MD   10 mL at 01/11/16 1103    PHYSICAL EXAMINATION: ECOG PERFORMANCE STATUS: 1 - Symptomatic but completely ambulatory  Vitals:   03/25/18 0824  BP: (!) 150/94  Pulse: 97  Resp: 18  Temp: 98.3 F (36.8 C)  SpO2: 100%   Filed Weights   03/25/18 0824  Weight: 168 lb 3.2 oz (76.3 kg)    GENERAL:alert, no distress and comfortable SKIN: skin color, texture, turgor are normal, no rashes or significant lesions EYES: normal, Conjunctiva are pink and non-injected, sclera clear OROPHARYNX:no exudate, no erythema and lips, buccal mucosa, and tongue normal  NECK: supple, thyroid normal size, non-tender, without nodularity LYMPH:  no palpable lymphadenopathy in the cervical, axillary or inguinal LUNGS: clear to auscultation and percussion with normal breathing effort HEART: regular rate & rhythm and no murmurs and no lower extremity edema ABDOMEN:abdomen soft, non-tender and normal bowel sounds MUSCULOSKELETAL:no cyanosis of digits and no clubbing  NEURO: alert & oriented x 3 with fluent speech, no focal motor/sensory deficits EXTREMITIES: No lower extremity edema BREAST:  No palpable masses or nodules in either right or left breasts. No palpable axillary supraclavicular or infraclavicular adenopathy no breast tenderness or nipple discharge. (exam performed in the presence of a chaperone)  LABORATORY DATA:  I have reviewed the data as listed CMP Latest Ref Rng & Units  09/19/2017 03/07/2016 02/29/2016  Glucose 70 - 140 mg/dl - 107 130  BUN 4 - 21 8 9.8 7.7  Creatinine 0.5 - 1.1 1.0 0.7 0.8  Sodium 136 - 145 mEq/L - 140 137  Potassium 3.5 - 5.1 mEq/L - 4.0 3.7  CO2 22 - 29 mEq/L - 26 25  Calcium 8.4 - 10.4 mg/dL - 9.4 9.2  Total Protein 6.4 - 8.3 g/dL - 6.7 6.6  Total Bilirubin 0.20 - 1.20 mg/dL - 0.36 0.40  Alkaline Phos 25 - 125 55 68 63  AST 13 - 35 39(A) 21 25  ALT 7 - 35 '31 27 31    ' Lab Results  Component Value Date   WBC 5.1 02/25/2017   HGB 14.0 02/25/2017   HCT 43.1 02/25/2017   MCV 78.0 02/25/2017   PLT 214.0 02/25/2017   NEUTROABS 3.7 02/25/2017    ASSESSMENT & PLAN:  Breast cancer of upper-inner quadrant of right female breast (HCC) Right Lumpectomy: 09/14/2015 IDC Grade 2/3, 1.2 cm, LVI Present, 0/1 LN T1CN0 (Stage 1A) Oncotype DX 44, 30% ROR Oncotype DX counseling: I discussed the result of Oncotype DX scoring provided her with a copy of this report. Based upon her risk score of 44, with tamoxifen alone the risk of recurrence of 30%. This is very high  risk and I recommended systemic chemotherapy. Adjuvant chemotherapy with dose dense Adriamycin and Cytoxan every 2 weeks 4 followed by Abraxane weekly 7 stopped early due to toxicities ( started 11/09/2015 completed 03/07/2016) Adjuvant radiation therapy completed 05/15/2016  Current treatment: Adjuvant antiestrogen therapy with tamoxifen 20 mg daily started 06/13/2016   Tamoxifen toxicities: Hot flashes wake her up at night.  I instructed her to try to take tamoxifen in the morning and see if it makes any difference.   Breast Cancer Surveillance: 1. Breast exam 2019: Benign 2. Mammogram supposed to be done October 2018  but she has not done it because of financial reasons.  She will schedule it within the next month.  Chemotherapy-induced peripheral neuropathy: resolved  Return to clinic in 1 year for follow-up  Orders Placed This Encounter  Procedures  . MM DIAG BREAST  TOMO BILATERAL    Standing Status:   Future    Standing Expiration Date:   03/26/2019    Order Specific Question:   Reason for Exam (SYMPTOM  OR DIAGNOSIS REQUIRED)    Answer:   Annual mammogram for breast cancer    Order Specific Question:   Is the patient pregnant?    Answer:   No    Order Specific Question:   Preferred imaging location?    Answer:   [facility]   The patient has a good understanding of the overall plan. she agrees with it. she will call with any problems that may develop before the next visit here.   Harriette Ohara, MD 03/25/18

## 2018-03-25 ENCOUNTER — Inpatient Hospital Stay: Payer: BLUE CROSS/BLUE SHIELD | Attending: Hematology and Oncology | Admitting: Hematology and Oncology

## 2018-03-25 ENCOUNTER — Telehealth: Payer: Self-pay | Admitting: Hematology and Oncology

## 2018-03-25 DIAGNOSIS — Z923 Personal history of irradiation: Secondary | ICD-10-CM | POA: Diagnosis not present

## 2018-03-25 DIAGNOSIS — Z9221 Personal history of antineoplastic chemotherapy: Secondary | ICD-10-CM | POA: Diagnosis not present

## 2018-03-25 DIAGNOSIS — C50211 Malignant neoplasm of upper-inner quadrant of right female breast: Secondary | ICD-10-CM | POA: Diagnosis not present

## 2018-03-25 DIAGNOSIS — Z170 Estrogen receptor positive status [ER+]: Secondary | ICD-10-CM | POA: Diagnosis not present

## 2018-03-25 DIAGNOSIS — Z79810 Long term (current) use of selective estrogen receptor modulators (SERMs): Secondary | ICD-10-CM | POA: Diagnosis not present

## 2018-03-25 MED ORDER — TAMOXIFEN CITRATE 20 MG PO TABS: 20.0000 mg | ORAL_TABLET | Freq: Every day | ORAL | 3 refills | Status: DC

## 2018-03-25 NOTE — Telephone Encounter (Signed)
Gave patient AVs and calendar of upcoming April 2020 appointments.  °

## 2018-04-16 ENCOUNTER — Encounter: Payer: Self-pay | Admitting: Hematology and Oncology

## 2019-01-03 ENCOUNTER — Other Ambulatory Visit: Payer: Self-pay | Admitting: Nurse Practitioner

## 2019-02-24 ENCOUNTER — Telehealth: Payer: Self-pay | Admitting: Hematology and Oncology

## 2019-02-24 NOTE — Telephone Encounter (Signed)
Called patient per 3/16 VM scheduling log.  Rescheduled appt per patient request, due to her going out of town.  Patient aware of appt date and time.

## 2019-03-31 ENCOUNTER — Ambulatory Visit: Payer: BLUE CROSS/BLUE SHIELD | Admitting: Hematology and Oncology

## 2019-04-09 NOTE — Assessment & Plan Note (Deleted)
Right Lumpectomy: 09/14/2015 IDC Grade 2/3, 1.2 cm, LVI Present, 0/1 LN T1CN0 (Stage 1A) Oncotype DX 44, 30% ROR Oncotype DX counseling: I discussed the result of Oncotype DX scoring provided her with a copy of this report. Based upon her risk score of 44, with tamoxifen alone the risk of recurrence of 30%. This is very high risk and I recommended systemic chemotherapy. Adjuvant chemotherapy with dose dense Adriamycin and Cytoxan every 2 weeks 4 followed by Abraxane weekly 7 stopped early due to toxicities ( started 11/09/2015 completed 03/07/2016) Adjuvant radiation therapy completed 05/15/2016  Current treatment: Adjuvant antiestrogen therapy with tamoxifen 20 mg daily started07/04/2016   Tamoxifen toxicities: Hot flashes wake her up at night.  I instructed her to try to take tamoxifen in the morning and see if it makes any difference.   Breast Cancer Surveillance: 1. Breast exam2019:Benign 2. Mammogram  04/16/2018 at [facility] benign.  Breast density category B  Chemotherapy-induced peripheral neuropathy: resolved  Return to clinic in1 yearfor follow-up

## 2019-04-10 ENCOUNTER — Telehealth: Payer: Self-pay | Admitting: Hematology and Oncology

## 2019-04-10 NOTE — Telephone Encounter (Signed)
Called patient per 5/1sch message - unable to reach patient . Left message for patient to call back to r/s if still needed.

## 2019-04-13 ENCOUNTER — Telehealth: Payer: Self-pay | Admitting: Hematology and Oncology

## 2019-04-13 NOTE — Telephone Encounter (Signed)
Called patient regarding upcoming [facility] appointment, per patient's request appointment has been cancelled to patient's personal reasons. Patient will call back when ready to reschedule.   Message to provider.

## 2019-04-14 ENCOUNTER — Inpatient Hospital Stay: Payer: Self-pay | Admitting: Hematology and Oncology

## 2020-12-18 NOTE — Progress Notes (Signed)
Patient Care Team: Inda Coke, [place] as PCP - General (Physician Assistant) Brien Few, MD as Consulting Physician (Obstetrics and Gynecology) Rolm Bookbinder, MD as Consulting Physician (General Surgery) Nicholas Lose, MD as Consulting Physician (Hematology and Oncology) Arloa Koh, MD (Inactive) as Consulting Physician (Radiation Oncology) Rockwell Germany, RN as Registered Nurse Mauro Kaufmann, RN as Registered Nurse Sylvan Cheese, NP as Nurse Practitioner (Nurse Practitioner)  DIAGNOSIS:    ICD-10-CM   1. Malignant neoplasm of upper-inner quadrant of right breast in female, estrogen receptor positive ([place])  C50.211    Z17.0     SUMMARY OF ONCOLOGIC HISTORY: Oncology History  Breast cancer of upper-inner quadrant of right female breast ([place])  08/10/2015 Mammogram   Right breast mass 1.2 cm irregular spiculated   08/11/2015 Initial Diagnosis   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, grade 2-3   08/18/2015 Procedure   Genetic testing normal (Genes tested include: ATM, BARD1, BRCA1, BRCA2, BRIP1, CDH1, CHEK2, FANCC, MLH1, MSH2, MSH6, NBN, PALB2, PMS2, PTEN, RAD51C, RAD51D, TP53, and XRCC2.  This panel also includes deletion/duplication analysis (without sequencing) for one gene, EPCAM)   09/14/2015 Surgery   Right lumpectomy Donne Hazel): IDC Grade 2/3, 1.2 cm, LVI Present, 0/1 LN T1CN0 (Stage 1A) Oncotype DX 44, 30% ROR, ER 100%, PR 100%, HER-2/neu negative ratio 1.38, Ki-67 20%   09/14/2015 Oncotype testing   RS 44; ROR 30% (high-risk)   10/12/2015 Surgery   Re-excision right breast inferior margins Donne Hazel). Path negative; margins negative.    11/09/2015 - 02/22/2016 Chemotherapy   adjuvant chemotherapy: Adriamycin and Cytoxan dose dense 4 followed by Abraxane weekly 7 (stopped early for toxicities)   04/05/2016 - 05/15/2016 Radiation Therapy   Adj XRT ([facility]). Right breast treated to 50.4 Gy in 28 fractions    06/2016 -   Anti-estrogen oral therapy   Tamoxifen 20 mg daily. Planned duration of treatment: 10 years.      CHIEF COMPLIANT: Follow-up on antiestrogen therapy with tamoxifen  INTERVAL HISTORY: Jessica Petty is a 45 y.o. with above-mentioned history of right-sided breast cancer who underwent surgery, adjuvant chemotherapy, radiation, and is currently on tamoxifen therapy. I last saw her 2.5 years ago. She presents to the clinic today for follow-up.    She reports that she divorced her husband who was having an affair.  Her daughter is now a Museum/gallery exhibitions officer at [employer].  She denies any lumps or nodules in the breast.  She has not had a mammogram since 2019.  Lots of things that happened during [place] pandemic and she wants to reestablish follow-up with [place].  She has not taken tamoxifen since 2 years.  ALLERGIES:  is allergic to tamiflu [oseltamivir phosphate].  MEDICATIONS:  Current Outpatient Medications  Medication Sig Dispense Refill   ALPRAZolam (XANAX) 1 MG tablet Take 1 mg by mouth 3 (three) times daily as needed (Prescribed by Dr, Toy Care).   5   amphetamine-dextroamphetamine (ADDERALL) 20 MG tablet TAKE 1 TABLET FOUR TIMES DAILY. Prescribed by Dr. Toy Care  0   ARIPiprazole (ABILIFY) 10 MG tablet Take 10 mg by mouth at bedtime. Prescribed by Dr. Toy Care  11   levonorgestrel (MIRENA) 20 MCG/24HR IUD 1 each by Intrauterine route once. Needs to be removed 12/2021     tamoxifen (NOLVADEX) 20 MG tablet Take 1 tablet (20 mg total) by mouth daily. 90 tablet 3   No current facility-administered medications for this visit.    PHYSICAL EXAMINATION: ECOG PERFORMANCE STATUS: 1 - Symptomatic but completely ambulatory  Vitals:   12/19/20 0900  BP: (!) 155/96  Pulse: (!) 101  Resp: 18  Temp: 99.4 F (37.4 C)  SpO2: 100%   Filed Weights   12/19/20 0900  Weight: 169 lb 1.6 oz (76.7 kg)    BREAST: No palpable masses or nodules in either right or left breasts. No palpable axillary supraclavicular or  infraclavicular adenopathy no breast tenderness or nipple discharge. (exam performed in the presence of a chaperone)  LABORATORY DATA:  I have reviewed the data as listed CMP Latest Ref Rng & Units 09/19/2017 03/07/2016 02/29/2016  Glucose 70 - 140 mg/dl - 107 130  BUN 4 - 21 8 9.8 7.7  Creatinine 0.5 - 1.1 1.0 0.7 0.8  Sodium 136 - 145 mEq/L - 140 137  Potassium 3.5 - 5.1 mEq/L - 4.0 3.7  CO2 22 - 29 mEq/L - 26 25  Calcium 8.4 - 10.4 mg/dL - 9.4 9.2  Total Protein 6.4 - 8.3 g/dL - 6.7 6.6  Total Bilirubin 0.20 - 1.20 mg/dL - 0.36 0.40  Alkaline Phos 25 - 125 55 68 63  AST 13 - 35 39(A) 21 25  ALT 7 - 35 '31 27 31    ' Lab Results  Component Value Date   WBC 5.1 02/25/2017   HGB 14.0 02/25/2017   HCT 43.1 02/25/2017   MCV 78.0 02/25/2017   PLT 214.0 02/25/2017   NEUTROABS 3.7 02/25/2017    ASSESSMENT & PLAN:  Breast cancer of upper-inner quadrant of right female breast (HCC) Right Lumpectomy: 09/14/2015 IDC Grade 2/3, 1.2 cm, LVI Present, 0/1 LN T1CN0 (Stage 1A) Oncotype DX 44, 30% ROR Oncotype DX  risk score of 44, with tamoxifen alone the risk of recurrence of 30%.  Adjuvant chemotherapy with dose dense Adriamycin and Cytoxan every 2 weeks 4 followed by Abraxane weekly 7 stopped early due to toxicities ( started 11/09/2015 completed 03/07/2016) Adjuvant radiation therapy completed 05/15/2016  Current treatment: Adjuvant antiestrogen therapy with tamoxifen 20 mg daily started07/04/2016  Plan to treat her for 10 years, patient stopped tamoxifen April 2020.  She was lost to follow-up for 2 and half years.  Tamoxifen toxicities: She has baseline hot flashes.  Breast Cancer Surveillance: 1. Breast exam1/09/2021:Benign 2. Mammogram  04/16/2018: Benign, breast density category B. I ordered another mammogram to be done at [facility].  Chemotherapy-induced peripheral neuropathy: resolved  Return to clinic in1 yearfor follow-up    No orders of the defined types were placed  in this encounter.  The patient has a good understanding of the overall plan. she agrees with it. she will call with any problems that may develop before the next visit here.  Total time spent: 20 mins including face to face time and time spent for planning, charting and coordination of care  Nicholas Lose, MD 12/19/2020  I, Cloyde Reams Dorshimer, am acting as scribe for Dr. Nicholas Lose.  I have reviewed the above documentation for accuracy and completeness, and I agree with the above.

## 2020-12-19 ENCOUNTER — Inpatient Hospital Stay: Payer: 59 | Attending: Hematology and Oncology | Admitting: Hematology and Oncology

## 2020-12-19 ENCOUNTER — Other Ambulatory Visit: Payer: Self-pay

## 2020-12-19 DIAGNOSIS — Z79899 Other long term (current) drug therapy: Secondary | ICD-10-CM | POA: Insufficient documentation

## 2020-12-19 DIAGNOSIS — C50211 Malignant neoplasm of upper-inner quadrant of right female breast: Secondary | ICD-10-CM | POA: Insufficient documentation

## 2020-12-19 DIAGNOSIS — Z923 Personal history of irradiation: Secondary | ICD-10-CM | POA: Diagnosis not present

## 2020-12-19 DIAGNOSIS — Z9221 Personal history of antineoplastic chemotherapy: Secondary | ICD-10-CM | POA: Insufficient documentation

## 2020-12-19 DIAGNOSIS — Z170 Estrogen receptor positive status [ER+]: Secondary | ICD-10-CM | POA: Insufficient documentation

## 2020-12-19 MED ORDER — TAMOXIFEN CITRATE 20 MG PO TABS: 20.0000 mg | ORAL_TABLET | Freq: Every day | ORAL | 3 refills | Status: DC

## 2020-12-19 NOTE — Assessment & Plan Note (Signed)
Right Lumpectomy: 09/14/2015 IDC Grade 2/3, 1.2 cm, LVI Present, 0/1 LN T1CN0 (Stage 1A) Oncotype DX 44, 30% ROR Oncotype DX  risk score of 44, with tamoxifen alone the risk of recurrence of 30%.  Adjuvant chemotherapy with dose dense Adriamycin and Cytoxan every 2 weeks 4 followed by Abraxane weekly 7 stopped early due to toxicities ( started 11/09/2015 completed 03/07/2016) Adjuvant radiation therapy completed 05/15/2016  Current treatment: Adjuvant antiestrogen therapy with tamoxifen 20 mg daily started07/04/2016  Plan to treat her for 10 years  Tamoxifen toxicities: Hot flashes wake her up at night.  I instructed her to try to take tamoxifen in the morning and see if it makes any difference.   Breast Cancer Surveillance: 1. Breast exam1/09/2021:Benign 2. Mammogram  04/16/2018: Benign, breast density category B.  Chemotherapy-induced peripheral neuropathy: resolved  Return to clinic in1 yearfor follow-up

## 2021-02-02 ENCOUNTER — Encounter: Payer: Self-pay | Admitting: Hematology and Oncology

## 2021-03-15 ENCOUNTER — Other Ambulatory Visit (HOSPITAL_COMMUNITY): Payer: Self-pay | Admitting: Orthopaedic Surgery

## 2021-03-15 DIAGNOSIS — M7989 Other specified soft tissue disorders: Secondary | ICD-10-CM

## 2021-03-15 DIAGNOSIS — M79661 Pain in right lower leg: Secondary | ICD-10-CM

## 2021-03-16 ENCOUNTER — Ambulatory Visit (HOSPITAL_COMMUNITY)
Admission: RE | Admit: 2021-03-16 | Discharge: 2021-03-16 | Disposition: A | Discharge status: Home / Self Care | Payer: 59 | Source: Ambulatory Visit | Attending: Orthopaedic Surgery | Admitting: Orthopaedic Surgery

## 2021-03-16 ENCOUNTER — Other Ambulatory Visit: Payer: Self-pay

## 2021-03-16 DIAGNOSIS — M79661 Pain in right lower leg: Secondary | ICD-10-CM | POA: Diagnosis not present

## 2021-03-16 DIAGNOSIS — M7989 Other specified soft tissue disorders: Secondary | ICD-10-CM | POA: Diagnosis not present

## 2021-10-04 ENCOUNTER — Other Ambulatory Visit: Payer: Self-pay

## 2021-10-04 ENCOUNTER — Emergency Department (HOSPITAL_BASED_OUTPATIENT_CLINIC_OR_DEPARTMENT_OTHER): Payer: 59 | Admitting: Radiology

## 2021-10-04 ENCOUNTER — Encounter (HOSPITAL_BASED_OUTPATIENT_CLINIC_OR_DEPARTMENT_OTHER): Payer: Self-pay | Admitting: Obstetrics and Gynecology

## 2021-10-04 ENCOUNTER — Emergency Department (HOSPITAL_BASED_OUTPATIENT_CLINIC_OR_DEPARTMENT_OTHER)
Admission: EM | Admit: 2021-10-04 | Discharge: 2021-10-04 | Disposition: A | Discharge status: Home / Self Care | Payer: 59 | Attending: Emergency Medicine | Admitting: Emergency Medicine

## 2021-10-04 DIAGNOSIS — Z853 Personal history of malignant neoplasm of breast: Secondary | ICD-10-CM | POA: Diagnosis not present

## 2021-10-04 DIAGNOSIS — X509XXA Other and unspecified overexertion or strenuous movements or postures, initial encounter: Secondary | ICD-10-CM | POA: Diagnosis not present

## 2021-10-04 DIAGNOSIS — S6990XA Unspecified injury of unspecified wrist, hand and finger(s), initial encounter: Secondary | ICD-10-CM | POA: Diagnosis present

## 2021-10-04 DIAGNOSIS — Z87891 Personal history of nicotine dependence: Secondary | ICD-10-CM | POA: Diagnosis not present

## 2021-10-04 DIAGNOSIS — S6991XA Unspecified injury of right wrist, hand and finger(s), initial encounter: Secondary | ICD-10-CM

## 2021-10-04 NOTE — ED Provider Notes (Signed)
[facility] EMERGENCY DEPT Provider Note   CSN: [id number] Arrival date & time: 10/04/21  1548     History Chief Complaint  Patient presents with   Hand Injury    Right    Jessica Petty is a 45 y.o. female.   Hand Injury Associated symptoms: no back pain and no fever   Patient presents for right hand injury.  Injury occurred 2 nights ago.  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  She was running the bases and slid feet first.  During this slide, she planted her right hand behind her and did sustain an injury.  She did not initially feel any pain.  Following the play, she did notice the pain and subsequent swelling.  She has since had pain, swelling, and bruising to the area of her thumb and thenar eminence.  She denies any numbness or weakness.  Range of motion has been limited by pain and swelling.  She has been taking ibuprofen for treatment of pain.  She presents to the ED due to concern of possible fracture.    Past Medical History:  Diagnosis Date   ADHD (attention deficit hyperactivity disorder)    Anxiety    Bipolar disorder ([place])    Breast cancer ([place])    Depressed mood    being seen by Dr. Oneida Arenas, LCSW   Radiation 04/05/16-05/15/16   right breast 50.4 Gy    Patient Active Problem List   Diagnosis Date Noted   Anxiety 03/15/2017   Attention deficit hyperactivity disorder (ADHD) 03/15/2017   Class 2 obesity due to excess calories without serious comorbidity with body mass index (BMI) of 36.0 to 36.9 in adult 03/15/2017   Intrauterine device 03/05/2017   LGSIL of cervix of undetermined significance 03/05/2017   Genetic testing 08/29/2015   Family history of ovarian cancer 08/18/2015   Breast cancer of upper-inner quadrant of right female breast ([place]) 08/12/2015    Past Surgical History:  Procedure Laterality Date   PORTACATH PLACEMENT N/A 10/12/2015   Procedure: INSERTION PORT-A-CATH WITH ULTRASOUND;  Surgeon: Rolm Bookbinder, MD;  Location: [facility];  Service: General;  Laterality: N/A;   RADIOACTIVE SEED GUIDED PARTIAL MASTECTOMY WITH AXILLARY SENTINEL LYMPH NODE BIOPSY Right 09/14/2015   Procedure: RADIOACTIVE SEED GUIDED PARTIAL MASTECTOMY WITH AXILLARY SENTINEL LYMPH NODE BIOPSY;  Surgeon: Rolm Bookbinder, MD;  Location: [facility];  Service: General;  Laterality: Right;   RE-EXCISION OF BREAST LUMPECTOMY Right 10/12/2015   Procedure: RE-EXCISION MARGIN RIGHT BREAST;  Surgeon: Rolm Bookbinder, MD;  Location: [facility];  Service: General;  Laterality: Right;   WISDOM TOOTH EXTRACTION       OB History     Gravida      Para      Term      Preterm      AB      Living  1      SAB      IAB      Ectopic      Multiple      Live Births              Family History  Problem Relation Age of Onset   Liver cancer Maternal Aunt 62       terminal; not a drinker   Lung cancer Maternal Uncle 5       former smoker   Ovarian cancer Paternal Aunt 58  metastasis to cervix and other areas   Other Sister        "couple of benign cervical biopsies"   Stroke Maternal Grandmother    Heart Problems Maternal Grandmother    Heart Problems Maternal Grandfather    Heart attack Paternal Grandmother     Social History   Tobacco Use   Smoking status: Former    Types: Cigarettes   Smokeless tobacco: Never   Tobacco comments:    less than half a pack - only smoked socially, maybe once a week  Vaping Use   Vaping Use: Never used  Substance Use Topics   Alcohol use: Yes    Comment: 1 drinks per day   Drug use: No    Home Medications Prior to Admission medications   Medication Sig Start Date End Date Taking? Authorizing Provider  ALPRAZolam Duanne Moron) 1 MG tablet Take 1 mg by mouth 3 (three) times daily as needed (Prescribed by Dr, Toy Care).  07/31/15   [provider]  amphetamine-dextroamphetamine (ADDERALL) 20 MG tablet TAKE 1  TABLET FOUR TIMES DAILY. Prescribed by Dr. Toy Care 02/09/17   [provider]  levonorgestrel (MIRENA) 20 MCG/24HR IUD 1 each by Intrauterine route once. Needs to be removed 12/2021    [provider]  tamoxifen (NOLVADEX) 20 MG tablet Take 1 tablet (20 mg total) by mouth daily. 12/19/20   Nicholas Lose, MD    Allergies    Tamiflu [oseltamivir phosphate]  Review of Systems   Review of Systems  Constitutional:  Negative for activity change, chills and fever.  HENT:  Negative for ear pain and sore throat.   Respiratory:  Negative for cough and shortness of breath.   Cardiovascular:  Negative for chest pain and palpitations.  Gastrointestinal:  Negative for abdominal pain, nausea and vomiting.  Musculoskeletal:  Positive for arthralgias and joint swelling. Negative for back pain.  Skin:  Negative for color change, rash and wound.  Neurological:  Negative for seizures, syncope, weakness and numbness.  Hematological:  Does not bruise/bleed easily.  All other systems reviewed and are negative.  Physical Exam Updated Vital Signs BP (!) 158/74 (BP Location: Right Arm)   Pulse 84   Temp 98.3 F (36.8 C) (Oral)   Resp 18   SpO2 100%   Physical Exam Vitals and nursing note reviewed.  Constitutional:      General: She is not in acute distress.    Appearance: Normal appearance. She is well-developed and normal weight. She is not ill-appearing, toxic-appearing or diaphoretic.  HENT:     Head: Normocephalic and atraumatic.     Right Ear: External ear normal.     Left Ear: External ear normal.     Nose: Nose normal.  Eyes:     Extraocular Movements: Extraocular movements intact.     Conjunctiva/sclera: Conjunctivae normal.  Cardiovascular:     Rate and Rhythm: Normal rate and regular rhythm.     Heart sounds: No murmur heard. Pulmonary:     Effort: Pulmonary effort is normal. No respiratory distress.     Breath sounds: Normal breath sounds.  Abdominal:     Palpations:  Abdomen is soft.     Tenderness: There is no abdominal tenderness.  Musculoskeletal:        General: Swelling, tenderness and signs of injury present. Normal range of motion.     Cervical back: Normal range of motion and neck supple.  Skin:    General: Skin is warm and dry.  Capillary Refill: Capillary refill takes less than 2 seconds.     Coloration: Skin is not jaundiced or pale.  Neurological:     General: No focal deficit present.     Mental Status: She is alert and oriented to person, place, and time.     Cranial Nerves: No cranial nerve deficit.     Sensory: No sensory deficit.     Motor: No weakness.  Psychiatric:        Mood and Affect: Mood normal.        Behavior: Behavior normal.        Thought Content: Thought content normal.        Judgment: Judgment normal.    ED Results / Procedures / Treatments   Labs (all labs ordered are listed, but only abnormal results are displayed) Labs Reviewed - No data to display  EKG None  Radiology DG Hand Complete Right  Result Date: 10/04/2021 CLINICAL DATA:  Right hand pain after injury EXAM: RIGHT HAND - COMPLETE 3+ VIEW COMPARISON:  None. FINDINGS: There is no evidence of fracture or dislocation. There is no evidence of arthropathy or other focal bone abnormality. Soft tissues are unremarkable. IMPRESSION: Negative. Electronically Signed   By: Davina Poke D.O.   On: 10/04/2021 16:56    Procedures Procedures   Medications Ordered in ED Medications - No data to display  ED Course  I have reviewed the triage vital signs and the nursing notes.  Pertinent labs & imaging results that were available during my care of the patient were reviewed by me and considered in my medical decision making (see chart for details).    MDM Rules/Calculators/A&P                          Patient presents for injury to her right hand that occurred 2 days ago.  She has since had swelling in the area of her right thumb and thenar  eminence.  Bruising has accumulated in the area of her right palm.  Prior to being bedded in the ED, x-ray imaging was obtained.  X-ray imaging did not show any evidence of osseous or joint abnormalities.  Patient was informed of x-ray result and did feel comfortable with discharge home.  She declined any pain medicine or orthotic.  She was discharged in good condition.  Final Clinical Impression(s) / ED Diagnoses Final diagnoses:  Hand injury, right, initial encounter    Rx / DC Orders ED Discharge Orders     None        Godfrey Pick, MD 10/05/21 0136

## 2021-10-04 NOTE — ED Triage Notes (Signed)
Patient reports she was sliding into second base on Monday night and her hand is bruised, painful and blue

## 2021-12-16 ENCOUNTER — Other Ambulatory Visit: Payer: Self-pay | Admitting: Hematology and Oncology

## 2021-12-18 NOTE — Assessment & Plan Note (Signed)
Right Lumpectomy: 09/14/2015 IDC Grade 2/3, 1.2 cm, LVI Present, 0/1 LN T1CN0 (Stage 1A) Oncotype DX 44, 30% ROR Oncotype DX  risk score of 44, with tamoxifen alone the risk of recurrence of 30%.  Adjuvant chemotherapy with dose dense Adriamycin and Cytoxan every 2 weeks 4 followed by Abraxane weekly 7 stopped early due to toxicities ( started 11/09/2015 completed 03/07/2016) Adjuvant radiation therapy completed 05/15/2016  Current treatment: Adjuvant antiestrogen therapy with tamoxifen 20 mg daily started07/04/2016  Plan to treat her for 10 years, patient stopped tamoxifen April 2020.  She was lost to follow-up for 2 and half years.  Tamoxifen toxicities: She has baseline hot flashes.  Breast Cancer Surveillance: 1. Breast exam1/09/2022:Benign 2. Mammogram 01/23/21: Benign, breast density category B. I ordered another mammogram to be done at [facility].  Chemotherapy-induced peripheral neuropathy: resolved  Return to clinic in1 yearfor follow-up

## 2021-12-19 ENCOUNTER — Inpatient Hospital Stay: Payer: 59 | Attending: Hematology and Oncology | Admitting: Hematology and Oncology

## 2021-12-19 ENCOUNTER — Other Ambulatory Visit: Payer: Self-pay

## 2021-12-19 DIAGNOSIS — Z170 Estrogen receptor positive status [ER+]: Secondary | ICD-10-CM | POA: Insufficient documentation

## 2021-12-19 DIAGNOSIS — Z923 Personal history of irradiation: Secondary | ICD-10-CM | POA: Diagnosis not present

## 2021-12-19 DIAGNOSIS — C50211 Malignant neoplasm of upper-inner quadrant of right female breast: Secondary | ICD-10-CM | POA: Insufficient documentation

## 2021-12-19 DIAGNOSIS — Z79810 Long term (current) use of selective estrogen receptor modulators (SERMs): Secondary | ICD-10-CM | POA: Diagnosis not present

## 2021-12-19 NOTE — Progress Notes (Signed)
Patient Care Team: Inda Coke, [place] as PCP - General (Physician Assistant) Brien Few, MD as Consulting Physician (Obstetrics and Gynecology) Rolm Bookbinder, MD as Consulting Physician (General Surgery) Nicholas Lose, MD as Consulting Physician (Hematology and Oncology) Arloa Koh, MD (Inactive) as Consulting Physician (Radiation Oncology) Rockwell Germany, RN as Registered Nurse Mauro Kaufmann, RN as Registered Nurse Jake Shark Johny Blamer, NP as Nurse Practitioner (Nurse Practitioner)  DIAGNOSIS:  Encounter Diagnosis  Name Primary?   Malignant neoplasm of upper-inner quadrant of right breast in female, estrogen receptor positive (Jessica Petty)     SUMMARY OF ONCOLOGIC HISTORY: Oncology History  Breast cancer of upper-inner quadrant of right female breast ([place])  08/10/2015 Mammogram   Right breast mass 1.2 cm irregular spiculated   08/11/2015 Initial Diagnosis   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, grade 2-3   08/18/2015 Procedure   Genetic testing normal (Genes tested include: ATM, BARD1, BRCA1, BRCA2, BRIP1, CDH1, CHEK2, FANCC, MLH1, MSH2, MSH6, NBN, PALB2, PMS2, PTEN, RAD51C, RAD51D, TP53, and XRCC2.  This panel also includes deletion/duplication analysis (without sequencing) for one gene, EPCAM)   09/14/2015 Surgery   Right lumpectomy Donne Hazel): IDC Grade 2/3, 1.2 cm, LVI Present, 0/1 LN T1CN0 (Stage 1A) Oncotype DX 44, 30% ROR, ER 100%, PR 100%, HER-2/neu negative ratio 1.38, Ki-67 20%   09/14/2015 Oncotype testing   RS 44; ROR 30% (high-risk)   10/12/2015 Surgery   Re-excision right breast inferior margins Donne Hazel). Path negative; margins negative.    11/09/2015 - 02/22/2016 Chemotherapy   adjuvant chemotherapy: Adriamycin and Cytoxan dose dense 4 followed by Abraxane weekly 7 (stopped early for toxicities)   04/05/2016 - 05/15/2016 Radiation Therapy   Adj XRT ([facility]). Right breast treated to 50.4 Gy in 28 fractions    06/2016 -   Anti-estrogen oral therapy   Tamoxifen 20 mg daily. Planned duration of treatment: 10 years.      CHIEF COMPLIANT: Follow-up on tamoxifen therapy  INTERVAL HISTORY: Jessica Petty is a 46 year old above-mentioned history of thyroid breast cancer who is currently on tamoxifen and is tolerating it extremely well.  Denies any major hot flashes or arthralgias or myalgias.  Denies any lumps or nodules in the breast.  She lost 25 pounds by following [place] diet and is keeping the weight off.   ALLERGIES:  is allergic to tamiflu [oseltamivir phosphate].  MEDICATIONS:  Current Outpatient Medications  Medication Sig Dispense Refill   ALPRAZolam (XANAX) 1 MG tablet Take 1 mg by mouth 3 (three) times daily as needed (Prescribed by Dr, Toy Care).   5   amphetamine-dextroamphetamine (ADDERALL) 20 MG tablet TAKE 1 TABLET FOUR TIMES DAILY. Prescribed by Dr. Toy Care  0   levonorgestrel (MIRENA) 20 MCG/24HR IUD 1 each by Intrauterine route once. Needs to be removed 12/2021     tamoxifen (NOLVADEX) 20 MG tablet TAKE 1 TABLET BY MOUTH EVERY DAY 90 tablet 3   No current facility-administered medications for this visit.    PHYSICAL EXAMINATION: ECOG PERFORMANCE STATUS: 1 - Symptomatic but completely ambulatory  Vitals:   12/19/21 0839 12/19/21 0840  BP: (!) 185/102 (!) 187/92  Pulse: (!) 107   Resp: 18   Temp: (!) 97.2 F (36.2 C)   SpO2: 100%    Filed Weights   12/19/21 0839  Weight: 137 lb 11.2 oz (62.5 kg)    BREAST: No palpable masses or nodules in either right or left breasts. No palpable axillary supraclavicular or infraclavicular adenopathy no breast tenderness or nipple discharge. (exam performed  in the presence of a chaperone)  LABORATORY DATA:  I have reviewed the data as listed CMP Latest Ref Rng & Units 09/19/2017 03/07/2016 02/29/2016  Glucose 70 - 140 mg/dl - 107 130  BUN 4 - 21 8 9.8 7.7  Creatinine 0.5 - 1.1 1.0 0.7 0.8  Sodium 136 - 145 mEq/L - 140 137  Potassium 3.5 - 5.1 mEq/L  - 4.0 3.7  CO2 22 - 29 mEq/L - 26 25  Calcium 8.4 - 10.4 mg/dL - 9.4 9.2  Total Protein 6.4 - 8.3 g/dL - 6.7 6.6  Total Bilirubin 0.20 - 1.20 mg/dL - 0.36 0.40  Alkaline Phos 25 - 125 55 68 63  AST 13 - 35 39(A) 21 25  ALT 7 - 35 _0 Lab Results  Component Value Date   WBC 5.1 02/25/2017   HGB 14.0 02/25/2017   HCT 43.1 02/25/2017   MCV 78.0 02/25/2017   PLT 214.0 02/25/2017   NEUTROABS 3.7 02/25/2017    ASSESSMENT & PLAN:  Breast cancer of upper-inner quadrant of right female breast (HCC) Right Lumpectomy: 09/14/2015 IDC Grade 2/3, 1.2 cm, LVI Present, 0/1 LN T1CN0 (Stage 1A) Oncotype DX 44, 30% ROR Oncotype DX  risk score of 44, with tamoxifen alone the risk of recurrence of 30%.  Adjuvant chemotherapy with dose dense Adriamycin and Cytoxan every 2 weeks 4 followed by Abraxane weekly 7 stopped early due to toxicities ( started 11/09/2015 completed 03/07/2016) Adjuvant radiation therapy completed 05/15/2016   Current treatment: Adjuvant antiestrogen therapy with tamoxifen 20 mg daily started 06/13/2016  Plan to treat her for 10 years, patient stopped tamoxifen April 2020.  She was lost to follow-up for 2 and half years.   Tamoxifen toxicities: She has baseline hot flashes.   Breast Cancer Surveillance: 1. Breast exam 12/19/2021: Benign 2. Mammogram  01/23/21: Benign, breast density category B. I ordered another mammogram to be done at [facility].   Chemotherapy-induced peripheral neuropathy: resolved She lost 25 pounds by doing [employer] and is maintaining it. Return to clinic in 1 year for follow-up   No orders of the defined types were placed in this encounter.  The patient has a good understanding of the overall plan. she agrees with it. she will call with any problems that may develop before the next visit here. Total time spent: 30 mins including face to face time and time spent for planning, charting and co-ordination of care   Harriette Ohara,  MD 12/19/21

## 2022-02-06 ENCOUNTER — Encounter: Payer: Self-pay | Admitting: Hematology and Oncology

## 2022-02-06 DIAGNOSIS — Z1231 Encounter for screening mammogram for malignant neoplasm of breast: Secondary | ICD-10-CM | POA: Diagnosis not present

## 2022-02-25 DIAGNOSIS — S0123XA Puncture wound without foreign body of nose, initial encounter: Secondary | ICD-10-CM | POA: Diagnosis not present

## 2022-02-25 DIAGNOSIS — W5503XA Scratched by cat, initial encounter: Secondary | ICD-10-CM | POA: Diagnosis not present

## 2022-02-25 DIAGNOSIS — S01432A Puncture wound without foreign body of left cheek and temporomandibular area, initial encounter: Secondary | ICD-10-CM | POA: Diagnosis not present

## 2022-02-25 DIAGNOSIS — S0183XA Puncture wound without foreign body of other part of head, initial encounter: Secondary | ICD-10-CM | POA: Diagnosis not present

## 2022-02-25 DIAGNOSIS — S01431A Puncture wound without foreign body of right cheek and temporomandibular area, initial encounter: Secondary | ICD-10-CM | POA: Diagnosis not present

## 2022-05-10 DIAGNOSIS — M25511 Pain in right shoulder: Secondary | ICD-10-CM | POA: Diagnosis not present

## 2022-05-10 DIAGNOSIS — M7541 Impingement syndrome of right shoulder: Secondary | ICD-10-CM | POA: Diagnosis not present

## 2022-05-10 DIAGNOSIS — M9905 Segmental and somatic dysfunction of pelvic region: Secondary | ICD-10-CM | POA: Diagnosis not present

## 2022-05-10 DIAGNOSIS — M25651 Stiffness of right hip, not elsewhere classified: Secondary | ICD-10-CM | POA: Diagnosis not present

## 2022-05-10 DIAGNOSIS — M9901 Segmental and somatic dysfunction of cervical region: Secondary | ICD-10-CM | POA: Diagnosis not present

## 2022-05-10 DIAGNOSIS — M542 Cervicalgia: Secondary | ICD-10-CM | POA: Diagnosis not present

## 2022-05-10 DIAGNOSIS — M9903 Segmental and somatic dysfunction of lumbar region: Secondary | ICD-10-CM | POA: Diagnosis not present

## 2022-05-10 DIAGNOSIS — M7918 Myalgia, other site: Secondary | ICD-10-CM | POA: Diagnosis not present

## 2022-05-10 DIAGNOSIS — M545 Low back pain, unspecified: Secondary | ICD-10-CM | POA: Diagnosis not present

## 2022-05-10 DIAGNOSIS — M546 Pain in thoracic spine: Secondary | ICD-10-CM | POA: Diagnosis not present

## 2022-05-10 DIAGNOSIS — M25652 Stiffness of left hip, not elsewhere classified: Secondary | ICD-10-CM | POA: Diagnosis not present

## 2022-05-10 DIAGNOSIS — M9902 Segmental and somatic dysfunction of thoracic region: Secondary | ICD-10-CM | POA: Diagnosis not present

## 2022-05-22 DIAGNOSIS — M25511 Pain in right shoulder: Secondary | ICD-10-CM | POA: Diagnosis not present

## 2022-05-22 DIAGNOSIS — M542 Cervicalgia: Secondary | ICD-10-CM | POA: Diagnosis not present

## 2022-05-22 DIAGNOSIS — M9901 Segmental and somatic dysfunction of cervical region: Secondary | ICD-10-CM | POA: Diagnosis not present

## 2022-05-22 DIAGNOSIS — M7541 Impingement syndrome of right shoulder: Secondary | ICD-10-CM | POA: Diagnosis not present

## 2022-05-22 DIAGNOSIS — M9902 Segmental and somatic dysfunction of thoracic region: Secondary | ICD-10-CM | POA: Diagnosis not present

## 2022-05-22 DIAGNOSIS — M7918 Myalgia, other site: Secondary | ICD-10-CM | POA: Diagnosis not present

## 2022-05-22 DIAGNOSIS — M25651 Stiffness of right hip, not elsewhere classified: Secondary | ICD-10-CM | POA: Diagnosis not present

## 2022-05-22 DIAGNOSIS — M9903 Segmental and somatic dysfunction of lumbar region: Secondary | ICD-10-CM | POA: Diagnosis not present

## 2022-05-22 DIAGNOSIS — M9905 Segmental and somatic dysfunction of pelvic region: Secondary | ICD-10-CM | POA: Diagnosis not present

## 2022-05-22 DIAGNOSIS — M545 Low back pain, unspecified: Secondary | ICD-10-CM | POA: Diagnosis not present

## 2022-05-22 DIAGNOSIS — M25652 Stiffness of left hip, not elsewhere classified: Secondary | ICD-10-CM | POA: Diagnosis not present

## 2022-05-22 DIAGNOSIS — M546 Pain in thoracic spine: Secondary | ICD-10-CM | POA: Diagnosis not present

## 2022-06-06 DIAGNOSIS — M25511 Pain in right shoulder: Secondary | ICD-10-CM | POA: Diagnosis not present

## 2022-06-06 DIAGNOSIS — M7918 Myalgia, other site: Secondary | ICD-10-CM | POA: Diagnosis not present

## 2022-06-06 DIAGNOSIS — M542 Cervicalgia: Secondary | ICD-10-CM | POA: Diagnosis not present

## 2022-06-06 DIAGNOSIS — M25651 Stiffness of right hip, not elsewhere classified: Secondary | ICD-10-CM | POA: Diagnosis not present

## 2022-06-06 DIAGNOSIS — M9905 Segmental and somatic dysfunction of pelvic region: Secondary | ICD-10-CM | POA: Diagnosis not present

## 2022-06-06 DIAGNOSIS — M9901 Segmental and somatic dysfunction of cervical region: Secondary | ICD-10-CM | POA: Diagnosis not present

## 2022-06-06 DIAGNOSIS — M545 Low back pain, unspecified: Secondary | ICD-10-CM | POA: Diagnosis not present

## 2022-06-06 DIAGNOSIS — M9903 Segmental and somatic dysfunction of lumbar region: Secondary | ICD-10-CM | POA: Diagnosis not present

## 2022-06-06 DIAGNOSIS — M25652 Stiffness of left hip, not elsewhere classified: Secondary | ICD-10-CM | POA: Diagnosis not present

## 2022-06-06 DIAGNOSIS — M9902 Segmental and somatic dysfunction of thoracic region: Secondary | ICD-10-CM | POA: Diagnosis not present

## 2022-06-06 DIAGNOSIS — M7541 Impingement syndrome of right shoulder: Secondary | ICD-10-CM | POA: Diagnosis not present

## 2022-06-06 DIAGNOSIS — M546 Pain in thoracic spine: Secondary | ICD-10-CM | POA: Diagnosis not present

## 2022-07-06 DIAGNOSIS — M9901 Segmental and somatic dysfunction of cervical region: Secondary | ICD-10-CM | POA: Diagnosis not present

## 2022-07-06 DIAGNOSIS — M9902 Segmental and somatic dysfunction of thoracic region: Secondary | ICD-10-CM | POA: Diagnosis not present

## 2022-07-06 DIAGNOSIS — M545 Low back pain, unspecified: Secondary | ICD-10-CM | POA: Diagnosis not present

## 2022-07-06 DIAGNOSIS — M542 Cervicalgia: Secondary | ICD-10-CM | POA: Diagnosis not present

## 2022-07-06 DIAGNOSIS — M7918 Myalgia, other site: Secondary | ICD-10-CM | POA: Diagnosis not present

## 2022-07-06 DIAGNOSIS — M9903 Segmental and somatic dysfunction of lumbar region: Secondary | ICD-10-CM | POA: Diagnosis not present

## 2022-07-06 DIAGNOSIS — M25511 Pain in right shoulder: Secondary | ICD-10-CM | POA: Diagnosis not present

## 2022-07-06 DIAGNOSIS — M25651 Stiffness of right hip, not elsewhere classified: Secondary | ICD-10-CM | POA: Diagnosis not present

## 2022-07-06 DIAGNOSIS — M9905 Segmental and somatic dysfunction of pelvic region: Secondary | ICD-10-CM | POA: Diagnosis not present

## 2022-07-06 DIAGNOSIS — M25652 Stiffness of left hip, not elsewhere classified: Secondary | ICD-10-CM | POA: Diagnosis not present

## 2022-07-06 DIAGNOSIS — M546 Pain in thoracic spine: Secondary | ICD-10-CM | POA: Diagnosis not present

## 2022-07-06 DIAGNOSIS — M7541 Impingement syndrome of right shoulder: Secondary | ICD-10-CM | POA: Diagnosis not present

## 2022-08-20 DIAGNOSIS — M545 Low back pain, unspecified: Secondary | ICD-10-CM | POA: Diagnosis not present

## 2022-08-20 DIAGNOSIS — M25651 Stiffness of right hip, not elsewhere classified: Secondary | ICD-10-CM | POA: Diagnosis not present

## 2022-08-20 DIAGNOSIS — M9902 Segmental and somatic dysfunction of thoracic region: Secondary | ICD-10-CM | POA: Diagnosis not present

## 2022-08-20 DIAGNOSIS — M542 Cervicalgia: Secondary | ICD-10-CM | POA: Diagnosis not present

## 2022-08-20 DIAGNOSIS — M7918 Myalgia, other site: Secondary | ICD-10-CM | POA: Diagnosis not present

## 2022-08-20 DIAGNOSIS — M546 Pain in thoracic spine: Secondary | ICD-10-CM | POA: Diagnosis not present

## 2022-08-20 DIAGNOSIS — M25652 Stiffness of left hip, not elsewhere classified: Secondary | ICD-10-CM | POA: Diagnosis not present

## 2022-08-20 DIAGNOSIS — M7541 Impingement syndrome of right shoulder: Secondary | ICD-10-CM | POA: Diagnosis not present

## 2022-08-20 DIAGNOSIS — M9905 Segmental and somatic dysfunction of pelvic region: Secondary | ICD-10-CM | POA: Diagnosis not present

## 2022-08-20 DIAGNOSIS — M9901 Segmental and somatic dysfunction of cervical region: Secondary | ICD-10-CM | POA: Diagnosis not present

## 2022-08-20 DIAGNOSIS — M25511 Pain in right shoulder: Secondary | ICD-10-CM | POA: Diagnosis not present

## 2022-08-20 DIAGNOSIS — M9903 Segmental and somatic dysfunction of lumbar region: Secondary | ICD-10-CM | POA: Diagnosis not present

## 2022-09-03 ENCOUNTER — Encounter: Payer: Self-pay | Admitting: *Deleted

## 2022-09-06 DIAGNOSIS — N760 Acute vaginitis: Secondary | ICD-10-CM | POA: Diagnosis not present

## 2022-09-06 DIAGNOSIS — Z01419 Encounter for gynecological examination (general) (routine) without abnormal findings: Secondary | ICD-10-CM | POA: Diagnosis not present

## 2022-09-06 DIAGNOSIS — R69 Illness, unspecified: Secondary | ICD-10-CM | POA: Diagnosis not present

## 2022-09-27 DIAGNOSIS — M546 Pain in thoracic spine: Secondary | ICD-10-CM | POA: Diagnosis not present

## 2022-09-27 DIAGNOSIS — M9901 Segmental and somatic dysfunction of cervical region: Secondary | ICD-10-CM | POA: Diagnosis not present

## 2022-09-27 DIAGNOSIS — M9902 Segmental and somatic dysfunction of thoracic region: Secondary | ICD-10-CM | POA: Diagnosis not present

## 2022-09-27 DIAGNOSIS — M542 Cervicalgia: Secondary | ICD-10-CM | POA: Diagnosis not present

## 2022-09-27 DIAGNOSIS — M9905 Segmental and somatic dysfunction of pelvic region: Secondary | ICD-10-CM | POA: Diagnosis not present

## 2022-09-27 DIAGNOSIS — M7918 Myalgia, other site: Secondary | ICD-10-CM | POA: Diagnosis not present

## 2022-09-27 DIAGNOSIS — M25652 Stiffness of left hip, not elsewhere classified: Secondary | ICD-10-CM | POA: Diagnosis not present

## 2022-09-27 DIAGNOSIS — M7541 Impingement syndrome of right shoulder: Secondary | ICD-10-CM | POA: Diagnosis not present

## 2022-09-27 DIAGNOSIS — M9903 Segmental and somatic dysfunction of lumbar region: Secondary | ICD-10-CM | POA: Diagnosis not present

## 2022-09-27 DIAGNOSIS — M545 Low back pain, unspecified: Secondary | ICD-10-CM | POA: Diagnosis not present

## 2022-09-27 DIAGNOSIS — M25511 Pain in right shoulder: Secondary | ICD-10-CM | POA: Diagnosis not present

## 2022-09-27 DIAGNOSIS — M25651 Stiffness of right hip, not elsewhere classified: Secondary | ICD-10-CM | POA: Diagnosis not present

## 2022-10-09 ENCOUNTER — Telehealth: Payer: Self-pay

## 2022-10-09 NOTE — Patient Outreach (Signed)
  Care Coordination   10/09/2022 Name: Jessica Petty MRN: [id number] DOB: 07-May-1976   Care Coordination Outreach Attempts:  An unsuccessful telephone outreach was attempted today to offer the patient information about available care coordination services as a benefit of their health plan.   Follow Up Plan:  Additional outreach attempts will be made to offer the patient care coordination information and services.   Encounter Outcome:  No Answer  Care Coordination Interventions Activated:  No   Care Coordination Interventions:  No, not indicated    Jone Baseman, RN, MSN [facility] Care Management Care Management Coordinator Direct Line [phone]

## 2022-10-24 DIAGNOSIS — M25652 Stiffness of left hip, not elsewhere classified: Secondary | ICD-10-CM | POA: Diagnosis not present

## 2022-10-24 DIAGNOSIS — M542 Cervicalgia: Secondary | ICD-10-CM | POA: Diagnosis not present

## 2022-10-24 DIAGNOSIS — M7541 Impingement syndrome of right shoulder: Secondary | ICD-10-CM | POA: Diagnosis not present

## 2022-10-24 DIAGNOSIS — M25511 Pain in right shoulder: Secondary | ICD-10-CM | POA: Diagnosis not present

## 2022-10-24 DIAGNOSIS — M9905 Segmental and somatic dysfunction of pelvic region: Secondary | ICD-10-CM | POA: Diagnosis not present

## 2022-10-24 DIAGNOSIS — M9901 Segmental and somatic dysfunction of cervical region: Secondary | ICD-10-CM | POA: Diagnosis not present

## 2022-10-24 DIAGNOSIS — M545 Low back pain, unspecified: Secondary | ICD-10-CM | POA: Diagnosis not present

## 2022-10-24 DIAGNOSIS — M7918 Myalgia, other site: Secondary | ICD-10-CM | POA: Diagnosis not present

## 2022-10-24 DIAGNOSIS — M9903 Segmental and somatic dysfunction of lumbar region: Secondary | ICD-10-CM | POA: Diagnosis not present

## 2022-10-24 DIAGNOSIS — M9902 Segmental and somatic dysfunction of thoracic region: Secondary | ICD-10-CM | POA: Diagnosis not present

## 2022-10-24 DIAGNOSIS — M25651 Stiffness of right hip, not elsewhere classified: Secondary | ICD-10-CM | POA: Diagnosis not present

## 2022-10-24 DIAGNOSIS — M546 Pain in thoracic spine: Secondary | ICD-10-CM | POA: Diagnosis not present

## 2022-10-26 ENCOUNTER — Telehealth: Payer: Self-pay

## 2022-10-26 NOTE — Patient Outreach (Signed)
  Care Coordination   10/26/2022 Name: Jessica Petty MRN: [id number] DOB: [birth year]   Care Coordination Outreach Attempts:  A second unsuccessful outreach was attempted today to offer the patient with information about available care coordination services as a benefit of their health plan.     Follow Up Plan:  Additional outreach attempts will be made to offer the patient care coordination information and services.   Encounter Outcome:  No Answer  Care Coordination Interventions Activated:  No   Care Coordination Interventions:  No, not indicated      Jone Baseman, RN, MSN [facility] Care Management Care Management Coordinator Direct Line [phone]

## 2022-11-12 ENCOUNTER — Telehealth: Payer: 59 | Admitting: Physician Assistant

## 2022-11-12 DIAGNOSIS — J069 Acute upper respiratory infection, unspecified: Secondary | ICD-10-CM | POA: Diagnosis not present

## 2022-11-12 MED ORDER — FLUTICASONE PROPIONATE 50 MCG/ACT NA SUSP: 2.0000 | Freq: Every day | NASAL | 0 refills | Status: DC

## 2022-11-12 MED ORDER — BENZONATATE 100 MG PO CAPS: 100.0000 mg | ORAL_CAPSULE | Freq: Three times a day (TID) | ORAL | 0 refills | Status: DC | PRN

## 2022-11-12 NOTE — Progress Notes (Signed)
E-Visit for Upper Respiratory Infection   We are sorry you are not feeling well.  Here is how we plan to help!  Based on what you have shared with me, it looks like you may have a viral upper respiratory infection.  Upper respiratory infections are caused by a large number of viruses; however, rhinovirus is the most common cause.   Symptoms vary from person to person, with common symptoms including sore throat, cough, fatigue or lack of energy and feeling of general discomfort.  A low-grade fever of up to 100.4 may present, but is often uncommon.  Symptoms vary however, and are closely related to a person's age or underlying illnesses.  The most common symptoms associated with an upper respiratory infection are nasal discharge or congestion, cough, sneezing, headache and pressure in the ears and face.  These symptoms usually persist for about 3 to 10 days, but can last up to 2 weeks.  It is important to know that upper respiratory infections do not cause serious illness or complications in most cases.    Upper respiratory infections can be transmitted from person to person, with the most common method of transmission being a person's hands.  The virus is able to live on the skin and can infect other persons for up to 2 hours after direct contact.  Also, these can be transmitted when someone coughs or sneezes; thus, it is important to cover the mouth to reduce this risk.  To keep the spread of the illness at bay, good hand hygiene is very important.  This is an infection that is most likely caused by a virus. There are no specific treatments other than to help you with the symptoms until the infection runs its course.  We are sorry you are not feeling well.  Here is how we plan to help!   For nasal congestion, you may use an oral decongestants such as Mucinex D or if you have glaucoma or high blood pressure use plain Mucinex.  Saline nasal spray or nasal drops can help and can safely be used as often as  needed for congestion.  For your congestion, I have prescribed Fluticasone nasal spray one spray in each nostril twice a day  If you do not have a history of heart disease, hypertension, diabetes or thyroid disease, prostate/bladder issues or glaucoma, you may also use Sudafed to treat nasal congestion.  It is highly recommended that you consult with a pharmacist or your primary care physician to ensure this medication is safe for you to take.     If you have a cough, you may use cough suppressants such as Delsym and Robitussin.  If you have glaucoma or high blood pressure, you can also use Coricidin HBP.   For cough I have prescribed for you A prescription cough medication called Tessalon Perles 100 mg. You may take 1-2 capsules every 8 hours as needed for cough  If you have a sore or scratchy throat, use a saltwater gargle-  to  teaspoon of salt dissolved in a 4-ounce to 8-ounce glass of warm water.  Gargle the solution for approximately 15-30 seconds and then spit.  It is important not to swallow the solution.  You can also use throat lozenges/cough drops and Chloraseptic spray to help with throat pain or discomfort.  Warm or cold liquids can also be helpful in relieving throat pain.  For headache, pain or general discomfort, you can use Ibuprofen or Tylenol as directed.   Some authorities believe   that zinc sprays or the use of Echinacea may shorten the course of your symptoms.  I would recommend a Covid 19 test, if desired, as your symptoms could be consistent with that.  HOME CARE Only take medications as instructed by your medical team. Be sure to drink plenty of fluids. Water is fine as well as fruit juices, sodas and electrolyte beverages. You may want to stay away from caffeine or alcohol. If you are nauseated, try taking small sips of liquids. How do you know if you are getting enough fluid? Your urine should be a pale yellow or almost colorless. Get rest. Taking a steamy shower or  using a humidifier may help nasal congestion and ease sore throat pain. You can place a towel over your head and breathe in the steam from hot water coming from a faucet. Using a saline nasal spray works much the same way. Cough drops, hard candies and sore throat lozenges may ease your cough. Avoid close contacts especially the very young and the elderly Cover your mouth if you cough or sneeze Always remember to wash your hands.   GET HELP RIGHT AWAY IF: You develop worsening fever. If your symptoms do not improve within 10 days You develop yellow or green discharge from your nose over 3 days. You have coughing fits You develop a severe head ache or visual changes. You develop shortness of breath, difficulty breathing or start having chest pain Your symptoms persist after you have completed your treatment plan  MAKE SURE YOU  Understand these instructions. Will watch your condition. Will get help right away if you are not doing well or get worse.  Thank you for choosing an e-visit.  Your e-visit answers were reviewed by a board certified advanced clinical practitioner to complete your personal care plan. Depending upon the condition, your plan could have included both over the counter or prescription medications.  Please review your pharmacy choice. Make sure the pharmacy is open so you can pick up prescription now. If there is a problem, you may contact your provider through [employer] and have the prescription routed to another pharmacy.  Your safety is important to [place]. If you have drug allergies check your prescription carefully.   For the next 24 hours you can use MyChart to ask questions about today's visit, request a non-urgent call back, or ask for a work or school excuse. You will get an email in the next two days asking about your experience. I hope that your e-visit has been valuable and will speed your recovery.   I have spent 5 minutes in review of e-visit  questionnaire, review and updating patient chart, medical decision making and response to patient.   Mar Daring, PA-C

## 2022-11-14 ENCOUNTER — Telehealth: Payer: Self-pay

## 2022-11-14 NOTE — Patient Outreach (Signed)
  Care Coordination   11/14/2022 Name: Jessica Petty MRN: [id number] DOB: 23-Apr-1976   Care Coordination Outreach Attempts:  A third unsuccessful outreach was attempted today to offer the patient with information about available care coordination services as a benefit of their health plan.   Follow Up Plan:  No further outreach attempts will be made at this time. We have been unable to contact the patient to offer or enroll patient in care coordination services  Encounter Outcome:  No Answer   Care Coordination Interventions:  No, not indicated    Jone Baseman, RN, MSN [place] [facility] Care Management Coordinator Direct Line [phone]

## 2022-12-11 NOTE — Progress Notes (Incomplete)
Patient Care Team: Brien Few, MD as Consulting Physician (Obstetrics and Gynecology) Rolm Bookbinder, MD as Consulting Physician (General Surgery) Nicholas Lose, MD as Consulting Physician (Hematology and Oncology) Arloa Koh, MD (Inactive) as Consulting Physician (Radiation Oncology) Rockwell Germany, RN as Registered Nurse Mauro Kaufmann, RN as Registered Nurse Jake Shark Johny Blamer, NP as Nurse Practitioner (Nurse Practitioner)  DIAGNOSIS: No diagnosis found.  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  This panel also includes deletion/duplication analysis (without sequencing) for one gene, EPCAM)   09/14/2015 Surgery   Right lumpectomy Donne Hazel): IDC Grade 2/3, 1.2 cm, LVI Present, 0/1 LN T1CN0 (Stage 1A) Oncotype DX 44, 30% ROR, ER 100%, PR 100%, HER-2/neu negative ratio 1.38, Ki-67 20%   09/14/2015 Oncotype testing   RS 44; ROR 30% (high-risk)   10/12/2015 Surgery   Re-excision right breast inferior margins Donne Hazel). Path negative; margins negative.    11/09/2015 - 02/22/2016 Chemotherapy   adjuvant chemotherapy: Adriamycin and Cytoxan dose dense 4 followed by Abraxane weekly 7 (stopped early for toxicities)   04/05/2016 - 05/15/2016 Radiation Therapy   Adj XRT ([facility]). Right breast treated to 50.4 Gy in 28 fractions    06/2016 -  Anti-estrogen oral therapy   Tamoxifen 20 mg daily. Planned duration of treatment: 10 years.      CHIEF COMPLIANT: Follow-up on tamoxifen therapy   INTERVAL HISTORY: Jessica Petty is a  47 year old above-mentioned history of thyroid breast cancer who is currently on tamoxifen. She presents to the clinic for a follow-up.     ALLERGIES:  is allergic to tamiflu [oseltamivir phosphate].  MEDICATIONS:  Current Outpatient Medications  Medication Sig Dispense Refill   ALPRAZolam (XANAX) 1 MG tablet Take 1 mg by mouth 3 (three) times daily as needed (Prescribed by Dr, Toy Care).   5   amphetamine-dextroamphetamine (ADDERALL) 20 MG tablet TAKE 1 TABLET FOUR TIMES DAILY. Prescribed by Dr. Toy Care  0   benzonatate (TESSALON) 100 MG capsule Take 1 capsule (100 mg total) by mouth 3 (three) times daily as needed. 30 capsule 0   fluticasone (FLONASE) 50 MCG/ACT nasal spray Place 2 sprays into both nostrils daily. 16 g 0   levonorgestrel (MIRENA) 20 MCG/24HR IUD 1 each by Intrauterine route once. Needs to be removed 12/2021     tamoxifen (NOLVADEX) 20 MG tablet TAKE 1 TABLET BY MOUTH EVERY DAY 90 tablet 3   No current facility-administered medications for this visit.    PHYSICAL EXAMINATION: ECOG PERFORMANCE STATUS: {CHL ONC ECOG PS:[phone]}  There were no vitals filed for this visit. There were no vitals filed for this visit.  BREAST:*** No palpable masses or nodules in either right or left breasts. No palpable axillary supraclavicular or infraclavicular adenopathy no breast tenderness or nipple discharge. (exam performed in the presence of a chaperone)  LABORATORY DATA:  I have reviewed the data as listed    Latest Ref Rng & Units 09/19/2017   12:00 AM 03/07/2016    8:00 AM 02/29/2016    8:13 AM  CMP  Glucose 70 - 140 mg/dl  107  130  BUN 4 - 21 8     9.8  7.7   Creatinine 0.5 - 1.1 1.0     0.7  0.8   Sodium 136 - 145 mEq/L  140  137   Potassium 3.5 - 5.1 mEq/L  4.0  3.7   CO2 22 - 29 mEq/L  26  25   Calcium 8.4 - 10.4 mg/dL  9.4  9.2   Total Protein 6.4 - 8.3 g/dL  6.7  6.6   Total Bilirubin 0.20 - 1.20 mg/dL  0.36  0.40   Alkaline Phos 25 - 125 55     68  63   AST 13 - 35  39     21  25   ALT 7 - 35 _0 This result is from an external source.    Lab Results  Component Value Date   WBC 5.1 02/25/2017   HGB 14.0 02/25/2017   HCT 43.1 02/25/2017   MCV 78.0 02/25/2017   PLT 214.0 02/25/2017   NEUTROABS 3.7 02/25/2017    ASSESSMENT & PLAN:  No problem-specific Assessment & Plan notes found for this encounter.    No orders of the defined types were placed in this encounter.  The patient has a good understanding of the overall plan. she agrees with it. she will call with any problems that may develop before the next visit here. Total time spent: 30 mins including face to face time and time spent for planning, charting and co-ordination of care   Suzzette Righter, [place] 12/11/22    I Gardiner Coins am acting as a Education administrator for [employer]  ***

## 2022-12-18 ENCOUNTER — Telehealth: Payer: Self-pay | Admitting: Hematology and Oncology

## 2022-12-18 NOTE — Telephone Encounter (Signed)
Patient called to r/s appointment for after having mammogram. Patient r/s and notified.

## 2022-12-19 ENCOUNTER — Ambulatory Visit: Payer: 59 | Admitting: Hematology and Oncology

## 2022-12-21 DIAGNOSIS — M7541 Impingement syndrome of right shoulder: Secondary | ICD-10-CM | POA: Diagnosis not present

## 2022-12-21 DIAGNOSIS — M546 Pain in thoracic spine: Secondary | ICD-10-CM | POA: Diagnosis not present

## 2022-12-21 DIAGNOSIS — M542 Cervicalgia: Secondary | ICD-10-CM | POA: Diagnosis not present

## 2022-12-21 DIAGNOSIS — M25511 Pain in right shoulder: Secondary | ICD-10-CM | POA: Diagnosis not present

## 2022-12-21 DIAGNOSIS — M9905 Segmental and somatic dysfunction of pelvic region: Secondary | ICD-10-CM | POA: Diagnosis not present

## 2022-12-21 DIAGNOSIS — M9901 Segmental and somatic dysfunction of cervical region: Secondary | ICD-10-CM | POA: Diagnosis not present

## 2022-12-21 DIAGNOSIS — M25651 Stiffness of right hip, not elsewhere classified: Secondary | ICD-10-CM | POA: Diagnosis not present

## 2022-12-21 DIAGNOSIS — M545 Low back pain, unspecified: Secondary | ICD-10-CM | POA: Diagnosis not present

## 2022-12-21 DIAGNOSIS — M25652 Stiffness of left hip, not elsewhere classified: Secondary | ICD-10-CM | POA: Diagnosis not present

## 2022-12-21 DIAGNOSIS — M9902 Segmental and somatic dysfunction of thoracic region: Secondary | ICD-10-CM | POA: Diagnosis not present

## 2022-12-21 DIAGNOSIS — M9903 Segmental and somatic dysfunction of lumbar region: Secondary | ICD-10-CM | POA: Diagnosis not present

## 2022-12-21 DIAGNOSIS — M7918 Myalgia, other site: Secondary | ICD-10-CM | POA: Diagnosis not present

## 2023-01-01 ENCOUNTER — Other Ambulatory Visit: Payer: Self-pay | Admitting: Hematology and Oncology

## 2023-01-16 DIAGNOSIS — M25651 Stiffness of right hip, not elsewhere classified: Secondary | ICD-10-CM | POA: Diagnosis not present

## 2023-01-16 DIAGNOSIS — M25511 Pain in right shoulder: Secondary | ICD-10-CM | POA: Diagnosis not present

## 2023-01-16 DIAGNOSIS — M545 Low back pain, unspecified: Secondary | ICD-10-CM | POA: Diagnosis not present

## 2023-01-16 DIAGNOSIS — M9905 Segmental and somatic dysfunction of pelvic region: Secondary | ICD-10-CM | POA: Diagnosis not present

## 2023-01-16 DIAGNOSIS — M9901 Segmental and somatic dysfunction of cervical region: Secondary | ICD-10-CM | POA: Diagnosis not present

## 2023-01-16 DIAGNOSIS — M7541 Impingement syndrome of right shoulder: Secondary | ICD-10-CM | POA: Diagnosis not present

## 2023-01-16 DIAGNOSIS — M9902 Segmental and somatic dysfunction of thoracic region: Secondary | ICD-10-CM | POA: Diagnosis not present

## 2023-01-16 DIAGNOSIS — M7918 Myalgia, other site: Secondary | ICD-10-CM | POA: Diagnosis not present

## 2023-01-16 DIAGNOSIS — M546 Pain in thoracic spine: Secondary | ICD-10-CM | POA: Diagnosis not present

## 2023-01-16 DIAGNOSIS — M542 Cervicalgia: Secondary | ICD-10-CM | POA: Diagnosis not present

## 2023-01-16 DIAGNOSIS — M25652 Stiffness of left hip, not elsewhere classified: Secondary | ICD-10-CM | POA: Diagnosis not present

## 2023-01-16 DIAGNOSIS — M9903 Segmental and somatic dysfunction of lumbar region: Secondary | ICD-10-CM | POA: Diagnosis not present

## 2023-01-17 DIAGNOSIS — M25652 Stiffness of left hip, not elsewhere classified: Secondary | ICD-10-CM | POA: Diagnosis not present

## 2023-01-17 DIAGNOSIS — M25511 Pain in right shoulder: Secondary | ICD-10-CM | POA: Diagnosis not present

## 2023-01-17 DIAGNOSIS — M545 Low back pain, unspecified: Secondary | ICD-10-CM | POA: Diagnosis not present

## 2023-01-17 DIAGNOSIS — M542 Cervicalgia: Secondary | ICD-10-CM | POA: Diagnosis not present

## 2023-01-17 DIAGNOSIS — M9905 Segmental and somatic dysfunction of pelvic region: Secondary | ICD-10-CM | POA: Diagnosis not present

## 2023-01-17 DIAGNOSIS — M7918 Myalgia, other site: Secondary | ICD-10-CM | POA: Diagnosis not present

## 2023-01-17 DIAGNOSIS — M546 Pain in thoracic spine: Secondary | ICD-10-CM | POA: Diagnosis not present

## 2023-01-17 DIAGNOSIS — M9901 Segmental and somatic dysfunction of cervical region: Secondary | ICD-10-CM | POA: Diagnosis not present

## 2023-01-17 DIAGNOSIS — M7541 Impingement syndrome of right shoulder: Secondary | ICD-10-CM | POA: Diagnosis not present

## 2023-01-17 DIAGNOSIS — M25651 Stiffness of right hip, not elsewhere classified: Secondary | ICD-10-CM | POA: Diagnosis not present

## 2023-01-17 DIAGNOSIS — M9902 Segmental and somatic dysfunction of thoracic region: Secondary | ICD-10-CM | POA: Diagnosis not present

## 2023-01-17 DIAGNOSIS — M9903 Segmental and somatic dysfunction of lumbar region: Secondary | ICD-10-CM | POA: Diagnosis not present

## 2023-01-22 DIAGNOSIS — M7918 Myalgia, other site: Secondary | ICD-10-CM | POA: Diagnosis not present

## 2023-01-22 DIAGNOSIS — M546 Pain in thoracic spine: Secondary | ICD-10-CM | POA: Diagnosis not present

## 2023-01-22 DIAGNOSIS — M9905 Segmental and somatic dysfunction of pelvic region: Secondary | ICD-10-CM | POA: Diagnosis not present

## 2023-01-22 DIAGNOSIS — M25511 Pain in right shoulder: Secondary | ICD-10-CM | POA: Diagnosis not present

## 2023-01-22 DIAGNOSIS — M7541 Impingement syndrome of right shoulder: Secondary | ICD-10-CM | POA: Diagnosis not present

## 2023-01-22 DIAGNOSIS — M545 Low back pain, unspecified: Secondary | ICD-10-CM | POA: Diagnosis not present

## 2023-01-22 DIAGNOSIS — M25651 Stiffness of right hip, not elsewhere classified: Secondary | ICD-10-CM | POA: Diagnosis not present

## 2023-01-22 DIAGNOSIS — M9901 Segmental and somatic dysfunction of cervical region: Secondary | ICD-10-CM | POA: Diagnosis not present

## 2023-01-22 DIAGNOSIS — M542 Cervicalgia: Secondary | ICD-10-CM | POA: Diagnosis not present

## 2023-01-22 DIAGNOSIS — M9903 Segmental and somatic dysfunction of lumbar region: Secondary | ICD-10-CM | POA: Diagnosis not present

## 2023-01-22 DIAGNOSIS — M25652 Stiffness of left hip, not elsewhere classified: Secondary | ICD-10-CM | POA: Diagnosis not present

## 2023-01-22 DIAGNOSIS — M9902 Segmental and somatic dysfunction of thoracic region: Secondary | ICD-10-CM | POA: Diagnosis not present

## 2023-02-14 ENCOUNTER — Telehealth: Payer: Self-pay | Admitting: Hematology and Oncology

## 2023-02-14 NOTE — Telephone Encounter (Signed)
Patient called to reschedule appointment due to having rescheduled her mammogram appointment. Patient rescheduled and notified.

## 2023-02-15 ENCOUNTER — Inpatient Hospital Stay: Payer: 59 | Admitting: Hematology and Oncology

## 2023-02-19 DIAGNOSIS — Z1231 Encounter for screening mammogram for malignant neoplasm of breast: Secondary | ICD-10-CM | POA: Diagnosis not present

## 2023-02-19 DIAGNOSIS — M9901 Segmental and somatic dysfunction of cervical region: Secondary | ICD-10-CM | POA: Diagnosis not present

## 2023-02-19 DIAGNOSIS — M9905 Segmental and somatic dysfunction of pelvic region: Secondary | ICD-10-CM | POA: Diagnosis not present

## 2023-02-19 DIAGNOSIS — M9903 Segmental and somatic dysfunction of lumbar region: Secondary | ICD-10-CM | POA: Diagnosis not present

## 2023-02-19 DIAGNOSIS — M545 Low back pain, unspecified: Secondary | ICD-10-CM | POA: Diagnosis not present

## 2023-02-19 DIAGNOSIS — M25511 Pain in right shoulder: Secondary | ICD-10-CM | POA: Diagnosis not present

## 2023-02-19 DIAGNOSIS — M542 Cervicalgia: Secondary | ICD-10-CM | POA: Diagnosis not present

## 2023-02-19 DIAGNOSIS — M7918 Myalgia, other site: Secondary | ICD-10-CM | POA: Diagnosis not present

## 2023-02-19 DIAGNOSIS — M546 Pain in thoracic spine: Secondary | ICD-10-CM | POA: Diagnosis not present

## 2023-02-19 DIAGNOSIS — M9902 Segmental and somatic dysfunction of thoracic region: Secondary | ICD-10-CM | POA: Diagnosis not present

## 2023-02-19 DIAGNOSIS — M25651 Stiffness of right hip, not elsewhere classified: Secondary | ICD-10-CM | POA: Diagnosis not present

## 2023-02-19 DIAGNOSIS — M7541 Impingement syndrome of right shoulder: Secondary | ICD-10-CM | POA: Diagnosis not present

## 2023-02-19 DIAGNOSIS — M25652 Stiffness of left hip, not elsewhere classified: Secondary | ICD-10-CM | POA: Diagnosis not present

## 2023-02-26 DIAGNOSIS — R928 Other abnormal and inconclusive findings on diagnostic imaging of breast: Secondary | ICD-10-CM | POA: Diagnosis not present

## 2023-02-26 DIAGNOSIS — R922 Inconclusive mammogram: Secondary | ICD-10-CM | POA: Diagnosis not present

## 2023-02-27 ENCOUNTER — Encounter: Payer: Self-pay | Admitting: Hematology and Oncology

## 2023-02-28 NOTE — Progress Notes (Signed)
Patient Care Team: Nicholas Lose, MD as PCP - General (Hematology and Oncology) Brien Few, MD as Consulting Physician (Obstetrics and Gynecology) Rolm Bookbinder, MD as Consulting Physician (General Surgery) Nicholas Lose, MD as Consulting Physician (Hematology and Oncology) Arloa Koh, MD (Inactive) as Consulting Physician (Radiation Oncology) Rockwell Germany, RN as Registered Nurse Mauro Kaufmann, RN as Registered Nurse Jake Shark Johny Blamer, NP as Nurse Practitioner (Nurse Practitioner)  DIAGNOSIS:  Encounter Diagnosis  Name Primary?   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  This panel also includes deletion/duplication analysis (without sequencing) for one gene, EPCAM)   09/14/2015 Surgery   Right lumpectomy Donne Hazel): IDC Grade 2/3, 1.2 cm, LVI Present, 0/1 LN T1CN0 (Stage 1A) Oncotype DX 44, 30% ROR, ER 100%, PR 100%, HER-2/neu negative ratio 1.38, Ki-67 20%   09/14/2015 Oncotype testing   RS 44; ROR 30% (high-risk)   10/12/2015 Surgery   Re-excision right breast inferior margins Donne Hazel). Path negative; margins negative.    11/09/2015 - 02/22/2016 Chemotherapy   adjuvant chemotherapy: Adriamycin and Cytoxan dose dense 4 followed by Abraxane weekly 7 (stopped early for toxicities)   04/05/2016 - 05/15/2016 Radiation Therapy   Adj XRT ([facility]). Right breast treated to 50.4 Gy in 28 fractions    06/2016 -   Anti-estrogen oral therapy   Tamoxifen 20 mg daily. Planned duration of treatment: 10 years.      CHIEF COMPLIANT: Follow-up on Tamoxifen  INTERVAL HISTORY: Jessica Petty is a  47 year old above-mentioned history of thyroid breast cancer who is currently on tamoxifen. She reports that she is tolerating the tamoxifen, She says hot flashes still the same. She denies any other side effects. Denies any pain or discomfort in breast. She is staying very active.  ALLERGIES:  is allergic to alprazolam.  MEDICATIONS:  Current Outpatient Medications  Medication Sig Dispense Refill   ALPRAZolam (XANAX) 1 MG tablet Take 1 mg by mouth 3 (three) times daily as needed (Prescribed by Dr, Toy Care).   5   amphetamine-dextroamphetamine (ADDERALL) 20 MG tablet Take 20 mg by mouth in the morning, at noon, and at bedtime.  0   levonorgestrel (MIRENA) 20 MCG/24HR IUD 1 each by Intrauterine route once. Needs to be removed 12/2021     tamoxifen (NOLVADEX) 20 MG tablet Take 1 tablet (20 mg total) by mouth daily. 90 tablet 3   No current facility-administered medications for this visit.    PHYSICAL EXAMINATION: ECOG PERFORMANCE STATUS: 1 - Symptomatic but completely ambulatory  Vitals:   03/01/23 0800  BP: (!) 175/94  Pulse: 90  Resp: 18  Temp: (!) 97.3 F (36.3 C)  SpO2: 100%   Filed Weights   03/01/23 0800  Weight: 146 lb 14.4 oz (66.6 kg)    BREAST: No palpable masses or nodules in either right or left breasts. No palpable axillary supraclavicular or infraclavicular adenopathy no breast tenderness or nipple discharge. (exam performed in the presence of a chaperone)  LABORATORY DATA:  I have reviewed  the data as listed    Latest Ref Rng & Units 09/19/2017   12:00 AM 03/07/2016    8:00 AM 02/29/2016    8:13 AM  CMP  Glucose 70 - 140 mg/dl  107  130   BUN 4 - 21 8     9.8  7.7   Creatinine 0.5 - 1.1 1.0     0.7  0.8   Sodium 136 - 145 mEq/L  140  137   Potassium 3.5 - 5.1 mEq/L  4.0  3.7    CO2 22 - 29 mEq/L  26  25   Calcium 8.4 - 10.4 mg/dL  9.4  9.2   Total Protein 6.4 - 8.3 g/dL  6.7  6.6   Total Bilirubin 0.20 - 1.20 mg/dL  0.36  0.40   Alkaline Phos 25 - 125 55     68  63   AST 13 - 35 39     21  25   ALT 7 - 35 31     27  31       This result is from an external source.    Lab Results  Component Value Date   WBC 5.1 02/25/2017   HGB 14.0 02/25/2017   HCT 43.1 02/25/2017   MCV 78.0 02/25/2017   PLT 214.0 02/25/2017   NEUTROABS 3.7 02/25/2017    ASSESSMENT & PLAN:  Breast cancer of upper-inner quadrant of right female breast ([facility]) Right Lumpectomy: 09/14/2015 IDC Grade 2/3, 1.2 cm, LVI Present, 0/1 LN T1CN0 (Stage 1A) Oncotype DX 44, 30% ROR Oncotype DX  risk score of 44, with tamoxifen alone the risk of recurrence of 30%.  Adjuvant chemotherapy with dose dense Adriamycin and Cytoxan every 2 weeks 4 followed by Abraxane weekly 7 stopped early due to toxicities ( started 11/09/2015 completed 03/07/2016) Adjuvant radiation therapy completed 05/15/2016   Current treatment: Adjuvant antiestrogen therapy with tamoxifen 20 mg daily started 06/13/2016  Plan to treat her for 10 years, patient stopped tamoxifen April 2020.  She was lost to follow-up for 2 and half years.  She has since resumed tamoxifen   Tamoxifen toxicities: She has baseline hot flashes.   Breast Cancer Surveillance: 1. Breast exam 03/01/2023: Benign 2. Mammogram at [facility] 02/26/2023: Benign, breast density category B.   Chemotherapy-induced peripheral neuropathy: resolved She lost significant weight by dieting and now she is trying to maintain it. Return to clinic in 1 year for follow-up    No orders of the defined types were placed in this encounter.  The patient has a good understanding of the overall plan. she agrees with it. she will call with any problems that may develop before the next visit here. Total time spent: 30 mins including face to face time and time spent for planning,  charting and co-ordination of care   Harriette Ohara, MD 03/01/23    I Gardiner Coins am acting as a Education administrator for [employer]  I have reviewed the above documentation for accuracy and completeness, and I agree with the above.

## 2023-03-01 ENCOUNTER — Inpatient Hospital Stay: Payer: 59 | Attending: Hematology and Oncology | Admitting: Hematology and Oncology

## 2023-03-01 VITALS — BP 175/94 | HR 90 | Temp 97.3°F | Resp 18 | Ht 61.0 in | Wt 146.9 lb

## 2023-03-01 DIAGNOSIS — Z170 Estrogen receptor positive status [ER+]: Secondary | ICD-10-CM | POA: Diagnosis not present

## 2023-03-01 DIAGNOSIS — C50211 Malignant neoplasm of upper-inner quadrant of right female breast: Secondary | ICD-10-CM | POA: Diagnosis not present

## 2023-03-01 DIAGNOSIS — Z79810 Long term (current) use of selective estrogen receptor modulators (SERMs): Secondary | ICD-10-CM | POA: Insufficient documentation

## 2023-03-01 MED ORDER — TAMOXIFEN CITRATE 20 MG PO TABS: 20.0000 mg | ORAL_TABLET | Freq: Every day | ORAL | 3 refills | Status: DC

## 2023-03-01 NOTE — Assessment & Plan Note (Addendum)
Right Lumpectomy: 09/14/2015 IDC Grade 2/3, 1.2 cm, LVI Present, 0/1 LN T1CN0 (Stage 1A) Oncotype DX 44, 30% ROR Oncotype DX  risk score of 44, with tamoxifen alone the risk of recurrence of 30%.  Adjuvant chemotherapy with dose dense Adriamycin and Cytoxan every 2 weeks 4 followed by Abraxane weekly 7 stopped early due to toxicities ( started 11/09/2015 completed 03/07/2016) Adjuvant radiation therapy completed 05/15/2016   Current treatment: Adjuvant antiestrogen therapy with tamoxifen 20 mg daily started 06/13/2016  Plan to treat her for 10 years, patient stopped tamoxifen April 2020.  She was lost to follow-up for 2 and half years.  She has since resumed tamoxifen   Tamoxifen toxicities: She has baseline hot flashes.   Breast Cancer Surveillance: 1. Breast exam 03/01/2023: Benign 2. Mammogram at [facility] 02/26/2023: Benign, breast density category B.   Chemotherapy-induced peripheral neuropathy: resolved She lost significant weight by dieting and now she is trying to maintain it. Return to clinic in 1 year for follow-up

## 2023-03-04 ENCOUNTER — Telehealth: Payer: Self-pay | Admitting: Hematology and Oncology

## 2023-03-04 NOTE — Telephone Encounter (Signed)
Scheduled appointment per 3/22 los. Left voicemail.

## 2023-04-02 DIAGNOSIS — M25651 Stiffness of right hip, not elsewhere classified: Secondary | ICD-10-CM | POA: Diagnosis not present

## 2023-04-02 DIAGNOSIS — M9902 Segmental and somatic dysfunction of thoracic region: Secondary | ICD-10-CM | POA: Diagnosis not present

## 2023-04-02 DIAGNOSIS — M545 Low back pain, unspecified: Secondary | ICD-10-CM | POA: Diagnosis not present

## 2023-04-02 DIAGNOSIS — M542 Cervicalgia: Secondary | ICD-10-CM | POA: Diagnosis not present

## 2023-04-02 DIAGNOSIS — M25511 Pain in right shoulder: Secondary | ICD-10-CM | POA: Diagnosis not present

## 2023-04-02 DIAGNOSIS — M9903 Segmental and somatic dysfunction of lumbar region: Secondary | ICD-10-CM | POA: Diagnosis not present

## 2023-04-02 DIAGNOSIS — M546 Pain in thoracic spine: Secondary | ICD-10-CM | POA: Diagnosis not present

## 2023-04-02 DIAGNOSIS — M9901 Segmental and somatic dysfunction of cervical region: Secondary | ICD-10-CM | POA: Diagnosis not present

## 2023-04-02 DIAGNOSIS — M7918 Myalgia, other site: Secondary | ICD-10-CM | POA: Diagnosis not present

## 2023-04-02 DIAGNOSIS — M7541 Impingement syndrome of right shoulder: Secondary | ICD-10-CM | POA: Diagnosis not present

## 2023-04-02 DIAGNOSIS — M25652 Stiffness of left hip, not elsewhere classified: Secondary | ICD-10-CM | POA: Diagnosis not present

## 2023-04-02 DIAGNOSIS — M9905 Segmental and somatic dysfunction of pelvic region: Secondary | ICD-10-CM | POA: Diagnosis not present

## 2023-05-10 DIAGNOSIS — X58XXXA Exposure to other specified factors, initial encounter: Secondary | ICD-10-CM | POA: Diagnosis not present

## 2023-05-10 DIAGNOSIS — S62604A Fracture of unspecified phalanx of right ring finger, initial encounter for closed fracture: Secondary | ICD-10-CM | POA: Diagnosis not present

## 2023-05-10 DIAGNOSIS — M20012 Mallet finger of left finger(s): Secondary | ICD-10-CM | POA: Diagnosis not present

## 2023-05-10 DIAGNOSIS — M20011 Mallet finger of right finger(s): Secondary | ICD-10-CM | POA: Diagnosis not present

## 2023-05-13 DIAGNOSIS — M25511 Pain in right shoulder: Secondary | ICD-10-CM | POA: Diagnosis not present

## 2023-05-13 DIAGNOSIS — M20011 Mallet finger of right finger(s): Secondary | ICD-10-CM | POA: Diagnosis not present

## 2023-05-14 DIAGNOSIS — M20012 Mallet finger of left finger(s): Secondary | ICD-10-CM | POA: Diagnosis not present

## 2023-05-20 DIAGNOSIS — X58XXXA Exposure to other specified factors, initial encounter: Secondary | ICD-10-CM | POA: Diagnosis not present

## 2023-05-20 DIAGNOSIS — S62634A Displaced fracture of distal phalanx of right ring finger, initial encounter for closed fracture: Secondary | ICD-10-CM | POA: Diagnosis not present

## 2023-05-20 DIAGNOSIS — Y999 Unspecified external cause status: Secondary | ICD-10-CM | POA: Diagnosis not present

## 2023-05-20 DIAGNOSIS — S62632A Displaced fracture of distal phalanx of right middle finger, initial encounter for closed fracture: Secondary | ICD-10-CM | POA: Diagnosis not present

## 2023-05-20 DIAGNOSIS — M20012 Mallet finger of left finger(s): Secondary | ICD-10-CM | POA: Diagnosis not present

## 2023-05-23 DIAGNOSIS — M20011 Mallet finger of right finger(s): Secondary | ICD-10-CM | POA: Diagnosis not present

## 2023-05-23 DIAGNOSIS — M20012 Mallet finger of left finger(s): Secondary | ICD-10-CM | POA: Diagnosis not present

## 2023-05-30 DIAGNOSIS — M20012 Mallet finger of left finger(s): Secondary | ICD-10-CM | POA: Diagnosis not present

## 2023-05-30 DIAGNOSIS — M20011 Mallet finger of right finger(s): Secondary | ICD-10-CM | POA: Diagnosis not present

## 2023-06-04 DIAGNOSIS — M20011 Mallet finger of right finger(s): Secondary | ICD-10-CM | POA: Diagnosis not present

## 2023-06-04 DIAGNOSIS — M20012 Mallet finger of left finger(s): Secondary | ICD-10-CM | POA: Diagnosis not present

## 2023-06-06 DIAGNOSIS — F900 Attention-deficit hyperactivity disorder, predominantly inattentive type: Secondary | ICD-10-CM | POA: Diagnosis not present

## 2023-06-06 DIAGNOSIS — F411 Generalized anxiety disorder: Secondary | ICD-10-CM | POA: Diagnosis not present

## 2023-06-18 DIAGNOSIS — M20011 Mallet finger of right finger(s): Secondary | ICD-10-CM | POA: Diagnosis not present

## 2023-06-18 DIAGNOSIS — M20012 Mallet finger of left finger(s): Secondary | ICD-10-CM | POA: Diagnosis not present

## 2023-07-01 DIAGNOSIS — M20012 Mallet finger of left finger(s): Secondary | ICD-10-CM | POA: Diagnosis not present

## 2023-07-01 DIAGNOSIS — M20011 Mallet finger of right finger(s): Secondary | ICD-10-CM | POA: Diagnosis not present

## 2023-07-18 DIAGNOSIS — M20012 Mallet finger of left finger(s): Secondary | ICD-10-CM | POA: Diagnosis not present

## 2023-07-18 DIAGNOSIS — M20011 Mallet finger of right finger(s): Secondary | ICD-10-CM | POA: Diagnosis not present

## 2023-07-19 DIAGNOSIS — M20011 Mallet finger of right finger(s): Secondary | ICD-10-CM | POA: Diagnosis not present

## 2023-07-19 DIAGNOSIS — M20012 Mallet finger of left finger(s): Secondary | ICD-10-CM | POA: Diagnosis not present

## 2023-07-22 DIAGNOSIS — M20012 Mallet finger of left finger(s): Secondary | ICD-10-CM | POA: Diagnosis not present

## 2023-07-22 DIAGNOSIS — M20011 Mallet finger of right finger(s): Secondary | ICD-10-CM | POA: Diagnosis not present

## 2023-07-30 DIAGNOSIS — M20011 Mallet finger of right finger(s): Secondary | ICD-10-CM | POA: Diagnosis not present

## 2023-07-30 DIAGNOSIS — M20012 Mallet finger of left finger(s): Secondary | ICD-10-CM | POA: Diagnosis not present

## 2023-08-16 DIAGNOSIS — M20011 Mallet finger of right finger(s): Secondary | ICD-10-CM | POA: Diagnosis not present

## 2023-08-16 DIAGNOSIS — M20012 Mallet finger of left finger(s): Secondary | ICD-10-CM | POA: Diagnosis not present

## 2023-08-20 DIAGNOSIS — M20011 Mallet finger of right finger(s): Secondary | ICD-10-CM | POA: Diagnosis not present

## 2023-08-20 DIAGNOSIS — M20012 Mallet finger of left finger(s): Secondary | ICD-10-CM | POA: Diagnosis not present

## 2023-08-23 DIAGNOSIS — M20012 Mallet finger of left finger(s): Secondary | ICD-10-CM | POA: Diagnosis not present

## 2023-08-23 DIAGNOSIS — M20011 Mallet finger of right finger(s): Secondary | ICD-10-CM | POA: Diagnosis not present

## 2023-08-30 DIAGNOSIS — M20011 Mallet finger of right finger(s): Secondary | ICD-10-CM | POA: Diagnosis not present

## 2023-08-30 DIAGNOSIS — M20012 Mallet finger of left finger(s): Secondary | ICD-10-CM | POA: Diagnosis not present

## 2023-10-01 DIAGNOSIS — M20012 Mallet finger of left finger(s): Secondary | ICD-10-CM | POA: Diagnosis not present

## 2023-10-01 DIAGNOSIS — M20011 Mallet finger of right finger(s): Secondary | ICD-10-CM | POA: Diagnosis not present

## 2023-11-27 DIAGNOSIS — Z01419 Encounter for gynecological examination (general) (routine) without abnormal findings: Secondary | ICD-10-CM | POA: Diagnosis not present

## 2023-11-27 DIAGNOSIS — Z1331 Encounter for screening for depression: Secondary | ICD-10-CM | POA: Diagnosis not present

## 2023-11-27 DIAGNOSIS — Z30431 Encounter for routine checking of intrauterine contraceptive device: Secondary | ICD-10-CM | POA: Diagnosis not present

## 2023-11-29 DIAGNOSIS — F411 Generalized anxiety disorder: Secondary | ICD-10-CM | POA: Diagnosis not present

## 2024-01-08 DIAGNOSIS — M25652 Stiffness of left hip, not elsewhere classified: Secondary | ICD-10-CM | POA: Diagnosis not present

## 2024-01-08 DIAGNOSIS — M9905 Segmental and somatic dysfunction of pelvic region: Secondary | ICD-10-CM | POA: Diagnosis not present

## 2024-01-08 DIAGNOSIS — M545 Low back pain, unspecified: Secondary | ICD-10-CM | POA: Diagnosis not present

## 2024-01-08 DIAGNOSIS — M7541 Impingement syndrome of right shoulder: Secondary | ICD-10-CM | POA: Diagnosis not present

## 2024-01-08 DIAGNOSIS — M542 Cervicalgia: Secondary | ICD-10-CM | POA: Diagnosis not present

## 2024-01-08 DIAGNOSIS — M7918 Myalgia, other site: Secondary | ICD-10-CM | POA: Diagnosis not present

## 2024-01-08 DIAGNOSIS — M25651 Stiffness of right hip, not elsewhere classified: Secondary | ICD-10-CM | POA: Diagnosis not present

## 2024-01-08 DIAGNOSIS — M546 Pain in thoracic spine: Secondary | ICD-10-CM | POA: Diagnosis not present

## 2024-01-08 DIAGNOSIS — M9901 Segmental and somatic dysfunction of cervical region: Secondary | ICD-10-CM | POA: Diagnosis not present

## 2024-01-08 DIAGNOSIS — M9903 Segmental and somatic dysfunction of lumbar region: Secondary | ICD-10-CM | POA: Diagnosis not present

## 2024-01-08 DIAGNOSIS — M9902 Segmental and somatic dysfunction of thoracic region: Secondary | ICD-10-CM | POA: Diagnosis not present

## 2024-01-08 DIAGNOSIS — M25511 Pain in right shoulder: Secondary | ICD-10-CM | POA: Diagnosis not present

## 2024-02-26 ENCOUNTER — Telehealth: Payer: Self-pay | Admitting: Hematology and Oncology

## 2024-03-02 ENCOUNTER — Inpatient Hospital Stay: Payer: Self-pay | Admitting: Hematology and Oncology

## 2024-09-17 ENCOUNTER — Encounter: Payer: Self-pay | Admitting: Emergency Medicine

## 2024-09-17 ENCOUNTER — Telehealth: Admitting: Emergency Medicine

## 2024-09-17 DIAGNOSIS — B349 Viral infection, unspecified: Secondary | ICD-10-CM

## 2024-09-17 MED ORDER — NIRMATRELVIR/RITONAVIR (PAXLOVID) TABLET (RENAL DOSING): 2.0000 | ORAL_TABLET | Freq: Two times a day (BID) | ORAL | 0 refills | Status: DC

## 2024-09-17 MED ORDER — NIRMATRELVIR/RITONAVIR (PAXLOVID) TABLET (RENAL DOSING): 2.0000 | ORAL_TABLET | Freq: Two times a day (BID) | ORAL | 0 refills | Status: AC

## 2024-09-17 NOTE — Progress Notes (Signed)
 Patient called back after visit to say that [facility] price for paxlovid was $1700 and Jessica Petty would like to transfer her prescription to [facility]  1. Viral infection (Primary)  Waiting for COVID results instructed only to fill if rapid test at home is positive   - nirmatrelvir/ritonavir, renal dosing, (PAXLOVID) 10 x 150 MG & 10 x 100MG  TABS; Take 2 tablets by mouth 2 (two) times daily for 5 days. (Take nirmatrelvir 150 mg one tablet twice daily for 5 days and ritonavir 100 mg one tablet twice daily for 5 days) Patient GFR is unknown. Through shared decision making, Jessica Petty elected reduced dose  Dispense: 20 tablet; Refill: 0

## 2024-09-17 NOTE — Patient Instructions (Addendum)
 Jessica Petty, thank you for joining Jessica CHRISTELLA Belt, NP for today's virtual visit.  While this provider is not your primary care provider (PCP), if your PCP is located in our provider database this encounter information will be shared with them immediately following your visit.   [name] MyChart account gives you access to today's visit and all your visits, tests, and labs performed at [facility]  click here if you don't have a [name] MyChart account or go to [email or web address]  Consent: (Patient) Jessica Petty provided verbal consent for this virtual visit at the beginning of the encounter.  Current Medications:  Current Outpatient Medications:    nirmatrelvir/ritonavir, renal dosing, (PAXLOVID) 10 x 150 MG & 10 x 100MG  TABS, Take 2 tablets by mouth 2 (two) times daily for 5 days. (Take nirmatrelvir 150 mg one tablet twice daily for 5 days and ritonavir 100 mg one tablet twice daily for 5 days) Patient GFR is unknown. Through shared decision making, she elected reduced dose, Disp: 20 tablet, Rfl: 0   ALPRAZolam (XANAX) 1 MG tablet, Take 1 mg by mouth 3 (three) times daily as needed (Prescribed by Dr, Vincente). , Disp: , Rfl: 5   amphetamine-dextroamphetamine (ADDERALL) 20 MG tablet, Take 20 mg by mouth in the morning, at noon, and at bedtime., Disp: , Rfl: 0   levonorgestrel (MIRENA) 20 MCG/24HR IUD, 1 each by Intrauterine route once. Needs to be removed 12/2021, Disp: , Rfl:    Medications ordered in this encounter:  Meds ordered this encounter  Medications   nirmatrelvir/ritonavir, renal dosing, (PAXLOVID) 10 x 150 MG & 10 x 100MG  TABS    Sig: Take 2 tablets by mouth 2 (two) times daily for 5 days. (Take nirmatrelvir 150 mg one tablet twice daily for 5 days and ritonavir 100 mg one tablet twice daily for 5 days) Patient GFR is unknown. Through shared decision making, she elected reduced dose    Dispense:  20 tablet    Refill:  0     *If you need refills on  other medications prior to your next appointment, please contact your pharmacy*  Follow-Up: Call back or seek an in-person evaluation if the symptoms worsen or if the condition fails to improve as anticipated.  [facility] [phone]  Other Instructions Try Mucinex and ibuprofen for symptoms. REST when your body needs it. Drink more fluids.   Your symptoms are very similar to typical covid symptoms. I recommend you test yourself again for covid in the morning. If you are positive for covid, I have sent in a prescription for paxlovid for you to the [facility] pharmacy. If you are covid negative tomorrow, you do not need to take the medicine as it will not help you feel better.   Because you have not had your liver and kidney function tested recently, there is a risk in taking paxlovid. I have prescribed the kidney reduced dose of paxlovid. Talk with your pharmacist if you have questions about the risks of taking paxlovid without knowing your liver or kidney function status.   Signs of liver disease include:  Loss of appetite Yellowing of your skin and the whites of your eyes (jaundice) Dark-colored urine Pale-colored stools Itchy skin Stomach-area (abdominal) pain    If you have been instructed to have an in-person evaluation today at a local Urgent Care facility, please use the link below. It will take you to a list of all of our available  Urgent  Cares, including address, phone number and hours of operation. Please do not delay care.  [facility]  If you or a family member do not have a primary care provider, use the link below to schedule a visit and establish care. When you choose a [facility] primary care physician or advanced practice provider, you gain a long-term partner in health. Find a Primary Care Provider  Learn more about [facility]'s in-office and virtual care options: [facility] - Get Care Now

## 2024-09-17 NOTE — Addendum Note (Signed)
 Addended by: KENNYTH DOMINO E on: 09/17/2024 04:12 PM   Modules accepted: Orders

## 2024-09-17 NOTE — Progress Notes (Signed)
 Virtual Visit Consent   Jessica Petty, you are scheduled for a virtual visit with a [facility] provider today. Just as with appointments in the office, your consent must be obtained to participate. Your consent will be active for this visit and any virtual visit you may have with one of our providers in the next 365 days. If you have a MyChart account, a copy of this consent can be sent to you electronically.  As this is a virtual visit, video technology does not allow for your provider to perform a traditional examination. This may limit your provider's ability to fully assess your condition. If your provider identifies any concerns that need to be evaluated in person or the need to arrange testing (such as labs, EKG, etc.), we will make arrangements to do so. Although advances in technology are sophisticated, we cannot ensure that it will always work on either your end or our end. If the connection with a video visit is poor, the visit may have to be switched to a telephone visit. With either a video or telephone visit, we are not always able to ensure that we have a secure connection.  By engaging in this virtual visit, you consent to the provision of healthcare and authorize for your insurance to be billed (if applicable) for the services provided during this visit. Depending on your insurance coverage, you may receive a charge related to this service.  I need to obtain your verbal consent now. Are you willing to proceed with your visit today? TERRIA DESCHEPPER has provided verbal consent on 09/17/2024 for a virtual visit (video or telephone). [name], NP  Date: 09/17/2024 3:12 PM   Virtual Visit via Video Note   I, [name], connected with  Jessica Petty  ([id number], [birth year]) on 09/17/24 at  2:45 PM EDT by a video-enabled telemedicine application and verified that I am speaking with the correct person using two identifiers.  Location: Patient: Virtual Visit Location Patient:  Home Provider: Virtual Visit Location Provider: Home Office   I discussed the limitations of evaluation and management by telemedicine and the availability of in person appointments. The patient expressed understanding and agreed to proceed.    History of Present Illness: Jessica Petty is a 48 y.o. who identifies as a female who was assigned female at birth, and is being seen today for illness.  Sore throat started 09/14/24, fatigue/took a nap that day, exhausted but not sleeping well for last few days.   Fever yesterday 100F, took tylenol . Warm this morning, did not check temp, took motrin.   Sneezing a lot, lots of nasal congestion and rhinorrhea. Headache. Very tired, feels like she has brain fog  BLE feel heavy.   Covid neg yesterday and 09/15/24  Emergen-C, Mucinex, orange juice. Alternated tylenol /motrin yesterday.   Has no known liver or kidney disease. Has not had liver or kidney lab tests in probably years.   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 (Take nirmatrelvir 150 mg one tablet twice daily for 5 days and ritonavir 100 mg one tablet twice daily for 5 days) Patient GFR is unknown. Through shared decision making, she  elected reduced dose, Disp: 20 tablet, Rfl: 0   ALPRAZolam (XANAX) 1 MG tablet, Take 1 mg by mouth 3 (three) times daily as needed (Prescribed by Dr, Vincente). , Disp: , Rfl: 5   amphetamine-dextroamphetamine (ADDERALL) 20 MG tablet, Take 20 mg by mouth in the morning, at noon, and at bedtime., Disp: , Rfl: 0   levonorgestrel (MIRENA) 20 MCG/24HR IUD, 1 each by Intrauterine route once. Needs to be removed 12/2021, Disp: , Rfl:   Observations/Objective: Patient is well-developed, well-nourished in no acute distress.  Resting comfortably  at home.  Head is normocephalic, atraumatic.  No labored breathing.  Speech is clear and coherent with logical content.  Patient is alert and oriented at baseline.  She sounds very congested.   Assessment and Plan: 1. Viral infection (Primary)  Her sx are c/w covid. Sometimes people initially negative test pos for covid 4+ days later. I recommend she test again in the morning. She has asked that I rx paxlovid and if she tests tomorrow and is negative for covid, she will not take it. I do not want her to have to pay another copay to get seen again by someone else tomorrow if she is pos for covid tomorrow, and I am not working tomorrow, so she will not be able to message me.   Additionally, she does not have up to date kidney and liver tests available. We discussed going in person to urgente care if she is pos for covid tomorrow  vs accepting risks of unknown liver/kidney status. She accepts risks of taking paxlovid without knowing her current liver/kidney function; she requests dose reduced paxlovid. I have prescribed per her request. She will not pick up prescription if she is covid neg.   Discussed supportive care - take mucinex, ibuprofen. Rest when tired. Increase fluids. Does not need a note for work.   Follow Up Instructions: I discussed the assessment and treatment plan with the patient. The patient was provided an opportunity to ask questions and all were  answered. The patient agreed with the plan and demonstrated an understanding of the instructions.  A copy of instructions were sent to the patient via MyChart unless otherwise noted below.   Patient has requested to receive PHI (AVS, Work Notes, etc) pertaining to this video visit through e-mail as they are currently without active MyChart. They have voiced understand that email is not considered secure and their health information could be viewed by someone other than the patient.   The patient was advised to call back or seek an in-person evaluation if the symptoms worsen or if the condition fails to improve as anticipated.    [name], NP
